# Patient Record
Sex: Female | Born: 1945 | Race: Black or African American | Hispanic: No | Smoking: Never smoker
Health system: Southern US, Community
[De-identification: ages and names within clinical notes are randomized; demographics above are authoritative.]

## PROBLEM LIST (undated history)

## (undated) DIAGNOSIS — I10 Essential (primary) hypertension: Secondary | ICD-10-CM

## (undated) HISTORY — PX: BREAST BIOPSY: SHX20

---

## 1990-06-30 HISTORY — PX: CHOLECYSTECTOMY: SHX55

## 2004-05-14 ENCOUNTER — Ambulatory Visit: Payer: Self-pay | Admitting: Obstetrics and Gynecology

## 2005-05-15 ENCOUNTER — Ambulatory Visit: Payer: Self-pay | Admitting: Obstetrics and Gynecology

## 2006-04-15 ENCOUNTER — Ambulatory Visit: Payer: Self-pay | Admitting: Gastroenterology

## 2006-06-04 ENCOUNTER — Ambulatory Visit: Payer: Self-pay | Admitting: Obstetrics and Gynecology

## 2006-06-16 ENCOUNTER — Ambulatory Visit: Payer: Self-pay | Admitting: Gastroenterology

## 2007-06-22 ENCOUNTER — Ambulatory Visit: Payer: Self-pay | Admitting: Obstetrics and Gynecology

## 2007-06-29 ENCOUNTER — Ambulatory Visit: Payer: Self-pay | Admitting: Obstetrics and Gynecology

## 2007-10-12 ENCOUNTER — Ambulatory Visit: Payer: Self-pay | Admitting: Unknown Physician Specialty

## 2008-06-26 ENCOUNTER — Ambulatory Visit: Payer: Self-pay | Admitting: Obstetrics and Gynecology

## 2009-06-28 ENCOUNTER — Ambulatory Visit: Payer: Self-pay | Admitting: Obstetrics and Gynecology

## 2009-06-28 ENCOUNTER — Ambulatory Visit: Payer: Self-pay | Admitting: Gastroenterology

## 2009-08-20 ENCOUNTER — Ambulatory Visit: Payer: Self-pay | Admitting: Gastroenterology

## 2009-08-27 ENCOUNTER — Emergency Department: Payer: Self-pay | Admitting: Emergency Medicine

## 2009-08-31 ENCOUNTER — Ambulatory Visit: Payer: Self-pay | Admitting: Gastroenterology

## 2009-09-03 ENCOUNTER — Ambulatory Visit: Payer: Self-pay | Admitting: Gastroenterology

## 2010-03-11 ENCOUNTER — Ambulatory Visit: Payer: Self-pay | Admitting: Gastroenterology

## 2011-08-20 ENCOUNTER — Ambulatory Visit: Payer: Self-pay | Admitting: Obstetrics and Gynecology

## 2012-09-17 DIAGNOSIS — I10 Essential (primary) hypertension: Secondary | ICD-10-CM | POA: Insufficient documentation

## 2013-03-23 ENCOUNTER — Encounter: Payer: Self-pay | Admitting: General Surgery

## 2013-04-01 ENCOUNTER — Ambulatory Visit: Payer: Self-pay | Admitting: Family Medicine

## 2013-04-07 ENCOUNTER — Ambulatory Visit: Payer: Self-pay | Admitting: General Surgery

## 2013-04-11 ENCOUNTER — Encounter: Payer: Self-pay | Admitting: General Surgery

## 2013-04-26 ENCOUNTER — Ambulatory Visit: Payer: Self-pay | Admitting: General Surgery

## 2013-04-28 ENCOUNTER — Ambulatory Visit (INDEPENDENT_AMBULATORY_CARE_PROVIDER_SITE_OTHER): Payer: Medicare Other | Admitting: General Surgery

## 2013-04-28 ENCOUNTER — Encounter: Payer: Self-pay | Admitting: General Surgery

## 2013-04-28 VITALS — BP 146/80 | HR 88 | Resp 16 | Ht 64.0 in | Wt 187.0 lb

## 2013-04-28 DIAGNOSIS — N6452 Nipple discharge: Secondary | ICD-10-CM

## 2013-04-28 DIAGNOSIS — N6459 Other signs and symptoms in breast: Secondary | ICD-10-CM

## 2013-04-28 NOTE — Progress Notes (Addendum)
Patient ID: Abigail Ayers, female   DOB: 03-08-1946, 67 y.o.   MRN: [id number]  Chief Complaint  Patient presents with  . Other    new patient left breast bloody discharge    HPI Abigail Ayers is a 67 y.o. female.  who presents for a breast evaluation. The most recent mammogram was done on 04-01-13.  She had an ultrasound done as well.    She was leaning over the back of a seat and it put pressure with her underwire bra around July. She states after that she had bloody nipple drainage (x3) from the left breast and she felt the area was bruised on the the lower breast area.  No discoloration noted. No trauma to the nipple/ areolar area.  No drainage since since the end of September. Denies breast pain. She states Dr Greggory Stallion felt a lump in the right breast.  Patient does perform regular self breast checks and gets regular mammograms done.    Review of the PCP notes dated 03/20/2013 reported a history of clear drainage from the right breast as well as bloody drainage from the left.  By report, the clear fluid from the right breast had been ongoing for several years and unchanged. The bloody discharge from the left was new. No previous breast issues.    The patient's daughter had breast cancer at age 30 and by report was BRCA negative. HPI  History reviewed. No pertinent past medical history.  Past Surgical History  Procedure Laterality Date  . Cholecystectomy  1992    Family History  Problem Relation Age of Onset  . Breast cancer Daughter 20    [place], genetic test negative    Social History History  Substance Use Topics  . Smoking status: Never Smoker   . Smokeless tobacco: Not on file  . Alcohol Use: No    No Known Allergies  Current Outpatient Prescriptions  Medication Sig Dispense Refill  . Ascorbic Acid (VITAMIN C PO) Take 1 tablet by mouth.       . Cholecalciferol (VITAMIN D) 1000 UNITS capsule Take 1,000 Units by mouth.      . COD LIVER OIL PO Take by mouth.       . Flax OIL Take by mouth.      . magnesium 30 MG tablet Take 30 mg by mouth.      . Multiple Vitamin (MULTIVITAMIN) tablet Take 1 tablet by mouth.       . Potassium 99 MG TABS Take by mouth.      . vitamin B-12 (CYANOCOBALAMIN) 100 MCG tablet Take 50 mcg by mouth daily.      Marland Kitchen VITAMIN E PO Take 1 tablet by mouth.        No current facility-administered medications for this visit.    Review of Systems Review of Systems  Constitutional: Negative.   Respiratory: Negative.   Cardiovascular: Negative.     Blood pressure 146/80, pulse 88, resp. rate 16, height 5\' 4"  (1.626 m), weight 187 lb (84.823 kg).  Physical Exam Physical Exam  Constitutional: She is oriented to person, place, and time. She appears well-developed and well-nourished.  Neck: Neck supple.  Cardiovascular: Normal rate, regular rhythm and normal heart sounds.   Pulmonary/Chest: Effort normal and breath sounds normal. Right breast exhibits no inverted nipple, no mass, no nipple discharge, no skin change and no tenderness. Left breast exhibits nipple discharge. Left breast exhibits no inverted nipple, no mass, no skin change and no tenderness.  Left  breast > right breast Left nipple sits a little lower than the right nipple. The patient reports this is her baseline. Single duct discharge left nipple. Yellow with tinge of dark brown.  Right breast shows no discharge with manipulation.  Lymphadenopathy:    She has no cervical adenopathy.    She has no axillary adenopathy.  Neurological: She is alert and oriented to person, place, and time.  Skin: Skin is warm and dry.    Data Reviewed Imaging studies dated 04/01/2013 were reviewed. Mammogram showed no interval change. BI-RAD-1. Ultrasound examination was negative in both retroareolar areas.  Assessment    Spontaneous breast drainage, possibly related to intraductal papilloma.    Plan    Drainage was elicited on today's exam. Primarily thin fluid consistent dark  blood. With a negative ultrasound and mammogram, this is likely a small papilloma, but imaging cannot exclude a more sinister process. As drainage is still present now 3 months after initial presentation (with manipulation) I think duct excision as appropriate  The procedure was discussed in detail.  Cc: Angus Palms, MD     Patient scheduled for left breast biopsy at [facility] on 05/05/13, she is aware of instructions and date. She will call us with any questions or changes with her medications or health.  Earline Mayotte 04/29/2013, 10:25 AM

## 2013-04-28 NOTE — Patient Instructions (Addendum)
The patient is aware to call back for any questions or concerns. Left breast biopsy at [facility] as outpatient   Patient scheduled for left breast biopsy at [facility] on 05/05/13, she is aware of instructions and date. She will call us with any questions or changes with her medications or health.

## 2013-04-29 ENCOUNTER — Encounter: Payer: Self-pay | Admitting: General Surgery

## 2013-04-29 ENCOUNTER — Telehealth: Payer: Self-pay | Admitting: General Surgery

## 2013-04-29 ENCOUNTER — Other Ambulatory Visit: Payer: Self-pay | Admitting: General Surgery

## 2013-04-29 DIAGNOSIS — N6452 Nipple discharge: Secondary | ICD-10-CM

## 2013-05-02 ENCOUNTER — Ambulatory Visit: Payer: Self-pay | Admitting: General Surgery

## 2013-05-05 ENCOUNTER — Ambulatory Visit: Payer: Self-pay | Admitting: General Surgery

## 2013-05-05 DIAGNOSIS — N6459 Other signs and symptoms in breast: Secondary | ICD-10-CM

## 2013-05-09 LAB — PATHOLOGY REPORT

## 2013-05-10 ENCOUNTER — Telehealth: Payer: Self-pay | Admitting: General Surgery

## 2013-05-10 ENCOUNTER — Encounter: Payer: Self-pay | Admitting: General Surgery

## 2013-05-10 NOTE — Telephone Encounter (Signed)
Patient notified biopsy results were benign. Reports doing well. Tylenol for discomfort the evening of surgery, nothing since then. F/U scheduled for Nov 13th.

## 2013-05-11 ENCOUNTER — Encounter: Payer: Self-pay | Admitting: General Surgery

## 2013-05-12 ENCOUNTER — Ambulatory Visit (INDEPENDENT_AMBULATORY_CARE_PROVIDER_SITE_OTHER): Payer: Medicare Other | Admitting: General Surgery

## 2013-05-12 ENCOUNTER — Encounter: Payer: Self-pay | Admitting: General Surgery

## 2013-05-12 VITALS — BP 152/84 | HR 66 | Resp 13 | Ht 64.0 in | Wt 187.0 lb

## 2013-05-12 DIAGNOSIS — N6459 Other signs and symptoms in breast: Secondary | ICD-10-CM

## 2013-05-12 DIAGNOSIS — N6452 Nipple discharge: Secondary | ICD-10-CM

## 2013-05-12 NOTE — Progress Notes (Signed)
Patient ID: Abigail Ayers, female   DO: Jul 12, 1945, 67 y.o.   MRM: [id number]  Chief Complaint  Patient presents with  . Routine Post Op    left breast biopsy    HPI Abigail Ayers is a 67 y.o. female here today for her posy op left breast biopsy done on 05/05/13. Patient states she is doing well with no complaints.  HPI  No past medical history on file.  Past Surgical History  Procedure Laterality Date  . Cholecystectomy  1992    Family History  Problem Relation Age of Onset  . Breast cancer Daughter 47    [place], genetic test negative    Social History History  Substance Use Topics  . Smoking status: Never Smoker   . Smokeless tobacco: Not on file  . Alcohol Use: No    No Known Allergies  Current Outpatient Prescriptions  Medication Sig Dispense Refill  . Ascorbic Acid (VITAMIN C PO) Take 1 tablet by mouth.       . Cholecalciferol (VITAMIN D) 1000 UNITS capsule Take 1,000 Units by mouth.      . COD LIVER OIL PO Take by mouth.      . Flax OIL Take by mouth.      . magnesium 30 MG tablet Take 30 mg by mouth.      . Multiple Vitamin (MULTIVITAMIN) tablet Take 1 tablet by mouth.       . Potassium 99 MG TABS Take by mouth.      . vitamin B-12 (CYANOCOBALAMIN) 100 MCG tablet Take 50 mcg by mouth daily.      Marland Kitchen VITAMIN E PO Take 1 tablet by mouth.        No current facility-administered medications for this visit.    Review of Systems Review of Systems  Constitutional: Negative.   Respiratory: Negative.   Cardiovascular: Negative.     Blood pressure 152/84, pulse 66, resp. rate 13, height 5\' 4"  (1.626 m), weight 187 lb (84.823 kg).  Physical Exam Physical Exam  Constitutional: She is oriented to person, place, and time. She appears well-developed and well-nourished.  Pulmonary/Chest:    Left breast incision is healing well.  Neurological: She is alert and oriented to person, place, and time.  Skin: Skin is warm and dry.    Data Reviewed Pathology of  the left retroareolar ductal structure showed focal periductal chronic inflammation. Skin biopsy showed no pathologic change. No atypia or malignancy. Papilloma not identified.  Assessment    Doing well status post left central duct excision.     Plan    She was encouraged to apply Neosporin to the area of the nipple until healing is complete.        Earline Mayotte 05/13/2013, 6:10 AM

## 2013-05-12 NOTE — Patient Instructions (Signed)
Patient to return in three months.  

## 2013-08-16 ENCOUNTER — Ambulatory Visit: Payer: Self-pay | Admitting: General Surgery

## 2013-08-31 ENCOUNTER — Ambulatory Visit (INDEPENDENT_AMBULATORY_CARE_PROVIDER_SITE_OTHER): Payer: Medicare HMO | Admitting: General Surgery

## 2013-08-31 ENCOUNTER — Encounter: Payer: Self-pay | Admitting: General Surgery

## 2013-08-31 VITALS — BP 146/86 | HR 76 | Resp 12 | Ht 64.0 in | Wt 188.0 lb

## 2013-08-31 DIAGNOSIS — N6452 Nipple discharge: Secondary | ICD-10-CM

## 2013-08-31 DIAGNOSIS — N6459 Other signs and symptoms in breast: Secondary | ICD-10-CM

## 2013-08-31 NOTE — Patient Instructions (Addendum)
Continue self breast exams. Call office for any new breast issues or concerns. Further mammograms and evaluation with primary care doctor

## 2013-08-31 NOTE — Progress Notes (Signed)
Patient ID: Abigail Ayers, female   DOB: 02-11-1946, 68 y.o.   MRN: [id number]  Chief Complaint  Patient presents with  . Follow-up    left nipple drainage    HPI Abigail Ayers is a 68 y.o. female.  Here today for follow up left nipple drainage, she has not noticed any drainage and has not pressed on it to see if it will drain.  She has had a left breast biopsy done on 05/05/13. Patient states she is still doing well with no new breast complaints.   HPI  No past medical history on file.  Past Surgical History  Procedure Laterality Date  . Cholecystectomy  1992    Family History  Problem Relation Age of Onset  . Breast cancer Daughter 77    [place], genetic test negative    Social History History  Substance Use Topics  . Smoking status: Never Smoker   . Smokeless tobacco: Not on file  . Alcohol Use: No    No Known Allergies  Current Outpatient Prescriptions  Medication Sig Dispense Refill  . Ascorbic Acid (VITAMIN C PO) Take 1 tablet by mouth.       . Cholecalciferol (VITAMIN D) 1000 UNITS capsule Take 1,000 Units by mouth.      . COD LIVER OIL PO Take by mouth.      . Flax OIL Take by mouth.      . magnesium 30 MG tablet Take 30 mg by mouth.      . Multiple Vitamin (MULTIVITAMIN) tablet Take 1 tablet by mouth.       . Potassium 99 MG TABS Take by mouth.      . vitamin B-12 (CYANOCOBALAMIN) 100 MCG tablet Take 50 mcg by mouth daily.      Marland Kitchen VITAMIN E PO Take 1 tablet by mouth.        No current facility-administered medications for this visit.    Review of Systems Review of Systems  Constitutional: Negative.   Respiratory: Negative.   Cardiovascular: Negative.     Blood pressure 146/86, pulse 76, resp. rate 12, height 5\' 4"  (1.626 m), weight 188 lb (85.276 kg).  Physical Exam Physical Exam  Constitutional: She is oriented to person, place, and time. She appears well-developed and well-nourished.  Neck: Neck supple.  Cardiovascular: Normal rate, regular  rhythm and normal heart sounds.   Pulmonary/Chest: Effort normal and breath sounds normal. Right breast exhibits no inverted nipple, no mass, no nipple discharge, no skin change and no tenderness. Left breast exhibits no inverted nipple, no mass, no nipple discharge, no skin change and no tenderness.  Well healed scar 8-11 o'clock with slight thickening left breast  Lymphadenopathy:    She has no cervical adenopathy.    She has no axillary adenopathy.  Neurological: She is alert and oriented to person, place, and time.  Skin: Skin is warm, dry and intact.    Data Reviewed Pathology of May 05, 2013 biopsies from the left retroareolar ductal tissue showed focal periductal chronic inflammation without atypia or malignancy. Excision of the duct with the nipple showed skin with no pathologic change.  Assessment    Doing well status post left breast duct excision. No evidence of recurrent right breast ductal drainage.     Plan    Annual screening mammograms and evaluation with primary care physician due in Fall 2015.She was encouraged to call if she appreciated any changes lateral breast self-examination.  The patient was notified that today's blood pressure  needs to be watched carefully, as her diastolic numbers approaching 90.       Robert Bellow 09/01/2013, 5:16 PM

## 2014-05-01 ENCOUNTER — Encounter: Payer: Self-pay | Admitting: General Surgery

## 2014-06-16 ENCOUNTER — Ambulatory Visit: Payer: Self-pay | Admitting: Gastroenterology

## 2014-10-20 NOTE — Op Note (Signed)
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  OPERATIVE PROCEDURE: Excision of left breast retroareolar ductal network.   OPERATING SURGEON: Robert Bellow, MD  ANESTHESIA: General by LMA, Marcaine 0.5% plain 30 mL local infiltration.   ESTIMATED BLOOD LOSS: Minimal.   CLINICAL NOTE: This 69 year old woman had spontaneous development of bloody nipple drainage. Mammogram and ultrasound were negative. She was felt to be a candidate for duct excision.   OPERATIVE NOTE: With the patient under adequate general anesthesia, the area was prepped with ChloraPrep and draped. A circumareolar incision from the 7 to 12 o'clock position was made after instillation of local anesthesia. The skin was incised sharply and the remaining dissection completed with electrocautery. The medial aspect of the areola was elevated to expose the undersurface of the nipple. This was then excised sharply, and a core of the nipple along the course of the dilated duct as identified by a lacrimal duct probe was removed with a 2 mm punch biopsy. The underlying ductal tissue in the central portion of the breast was excised with cautery. No large dilated ducts were identified. The specimen was orientated and sent fresh per protocol for routine histology. The wound was then approximated with interrupted 2-0 Vicryl figure-of-eight sutures to obliterate the dead space. The adipose tissue in the area below the areola was approximated with interrupted 2-0 Vicryl sutures. The skin was closed with a running 4-0 Vicryl suture in multiple segments. Benzoin and Steri-Strips followed by Telfa, fluff gauze, Kerlix and an Ace wrap were applied.   Care was taken not to apply undue pressure on the nipple.   The patient tolerated  the procedure well and was taken to the recovery room in stable condition.    ____________________________ Robert Bellow, MD jwb:lb D: 05/06/2013 08:44:35 ET T: 05/06/2013 09:02:39 ET JOB#: [id number]  cc: Robert Bellow, MD, <Dictator> Salome Holmes, MD Anayi Bricco Amedeo Kinsman MD ELECTRONICALLY SIGNED 05/06/2013 11:58

## 2015-09-05 ENCOUNTER — Other Ambulatory Visit: Payer: Self-pay | Admitting: Family Medicine

## 2015-09-05 DIAGNOSIS — Z1239 Encounter for other screening for malignant neoplasm of breast: Secondary | ICD-10-CM

## 2015-09-21 ENCOUNTER — Ambulatory Visit
Admission: RE | Admit: 2015-09-21 | Discharge: 2015-09-21 | Disposition: A | Discharge status: Home / Self Care | Payer: Medicare HMO | Source: Ambulatory Visit | Attending: Family Medicine | Admitting: Family Medicine

## 2015-09-21 ENCOUNTER — Other Ambulatory Visit: Payer: Self-pay | Admitting: Family Medicine

## 2015-09-21 DIAGNOSIS — Z1239 Encounter for other screening for malignant neoplasm of breast: Secondary | ICD-10-CM

## 2015-09-21 DIAGNOSIS — Z1231 Encounter for screening mammogram for malignant neoplasm of breast: Secondary | ICD-10-CM | POA: Insufficient documentation

## 2016-09-03 ENCOUNTER — Other Ambulatory Visit: Payer: Self-pay | Admitting: Family Medicine

## 2016-09-03 DIAGNOSIS — Z1231 Encounter for screening mammogram for malignant neoplasm of breast: Secondary | ICD-10-CM

## 2016-09-10 ENCOUNTER — Ambulatory Visit: Admission: RE | Admit: 2016-09-10 | Payer: Medicare HMO | Source: Ambulatory Visit

## 2016-09-22 ENCOUNTER — Ambulatory Visit
Admission: RE | Admit: 2016-09-22 | Discharge: 2016-09-22 | Disposition: A | Discharge status: Home / Self Care | Payer: Medicare HMO | Source: Ambulatory Visit | Attending: Family Medicine | Admitting: Family Medicine

## 2016-09-22 DIAGNOSIS — Z1231 Encounter for screening mammogram for malignant neoplasm of breast: Secondary | ICD-10-CM | POA: Insufficient documentation

## 2017-06-17 DIAGNOSIS — M179 Osteoarthritis of knee, unspecified: Secondary | ICD-10-CM | POA: Insufficient documentation

## 2017-06-17 DIAGNOSIS — M1710 Unilateral primary osteoarthritis, unspecified knee: Secondary | ICD-10-CM | POA: Insufficient documentation

## 2017-09-07 ENCOUNTER — Other Ambulatory Visit: Payer: Self-pay | Admitting: Family Medicine

## 2017-09-07 DIAGNOSIS — Z1239 Encounter for other screening for malignant neoplasm of breast: Secondary | ICD-10-CM

## 2017-10-07 ENCOUNTER — Ambulatory Visit
Admission: RE | Admit: 2017-10-07 | Discharge: 2017-10-07 | Disposition: A | Discharge status: Home / Self Care | Payer: Medicare HMO | Source: Ambulatory Visit | Attending: Family Medicine | Admitting: Family Medicine

## 2017-10-07 DIAGNOSIS — Z1231 Encounter for screening mammogram for malignant neoplasm of breast: Secondary | ICD-10-CM | POA: Insufficient documentation

## 2017-10-07 DIAGNOSIS — Z1239 Encounter for other screening for malignant neoplasm of breast: Secondary | ICD-10-CM

## 2017-11-12 ENCOUNTER — Encounter (INDEPENDENT_AMBULATORY_CARE_PROVIDER_SITE_OTHER): Payer: Medicare HMO | Admitting: Vascular Surgery

## 2017-11-17 ENCOUNTER — Ambulatory Visit (INDEPENDENT_AMBULATORY_CARE_PROVIDER_SITE_OTHER): Payer: Medicare HMO | Admitting: Vascular Surgery

## 2017-12-01 ENCOUNTER — Encounter (INDEPENDENT_AMBULATORY_CARE_PROVIDER_SITE_OTHER): Payer: Self-pay | Admitting: Vascular Surgery

## 2017-12-01 ENCOUNTER — Encounter (INDEPENDENT_AMBULATORY_CARE_PROVIDER_SITE_OTHER): Payer: Self-pay

## 2017-12-01 ENCOUNTER — Ambulatory Visit (INDEPENDENT_AMBULATORY_CARE_PROVIDER_SITE_OTHER): Payer: Medicare HMO | Admitting: Vascular Surgery

## 2017-12-01 ENCOUNTER — Encounter (INDEPENDENT_AMBULATORY_CARE_PROVIDER_SITE_OTHER): Payer: Medicare HMO | Admitting: Vascular Surgery

## 2017-12-01 VITALS — BP 172/92 | HR 92 | Resp 14 | Ht 65.0 in | Wt 210.0 lb

## 2017-12-01 DIAGNOSIS — I739 Peripheral vascular disease, unspecified: Secondary | ICD-10-CM | POA: Diagnosis not present

## 2017-12-01 DIAGNOSIS — I83813 Varicose veins of bilateral lower extremities with pain: Secondary | ICD-10-CM | POA: Insufficient documentation

## 2017-12-01 NOTE — Progress Notes (Signed)
Subjective:    Patient ID: Abigail Ayers, female    DOB: 01-12-1946, 72 y.o.   MRN: [id number] Chief Complaint  Patient presents with  . New Patient (Initial Visit)    Varicose Veins, painful legs   Presents as a new patient self-referred for evaluation of painful varicose veins noted to the bilateral lower extremity.  The patient endorses a long-standing history of varicosities located to the bilateral legs.  She notes that they have become more painful and have increased in size and number recently.  The patient's discomfort has progressed to the point that she is unable to function on a daily basis and her symptoms have become lifestyle limiting prompting her to seek medical attention.  The patient notes she experiences a throbbing and cramping to her calf muscles usually towards the evening hour.  The patient denies experiencing any rest pain or ulcer formation to the lower extremity.  The patient denies any swelling to the bilateral lower extremity.  At this time, the patient does not engage in conservative therapy including wearing medical grade 1 compression socks, elevating her legs and remaining active on a daily basis.  The patient denies any recent surgery or trauma to the bilateral lower extremity.  Patient denies any DVT history to the bilateral lower extremity.  The patient denies any fever, nausea vomiting.  Review of Systems  Constitutional: Negative.   HENT: Negative.   Eyes: Negative.   Respiratory: Negative.   Cardiovascular:       Painful varicose veins Calf claudication  Gastrointestinal: Negative.   Endocrine: Negative.   Genitourinary: Negative.   Musculoskeletal: Negative.   Skin: Negative.   Allergic/Immunologic: Negative.   Neurological: Negative.   Hematological: Negative.   Psychiatric/Behavioral: Negative.       Objective:   Physical Exam  Constitutional: She is oriented to person, place, and time. She appears well-developed and well-nourished. No  distress.  HENT:  Head: Normocephalic and atraumatic.  Right Ear: External ear normal.  Left Ear: External ear normal.  Eyes: Pupils are equal, round, and reactive to light. Conjunctivae and EOM are normal.  Neck: Normal range of motion.  Cardiovascular: Normal rate, regular rhythm, normal heart sounds and intact distal pulses.  Pulses:      Radial pulses are 2+ on the right side, and 2+ on the left side.       Dorsalis pedis pulses are 1+ on the right side, and 1+ on the left side.       Posterior tibial pulses are 1+ on the right side, and 1+ on the left side.  Pulmonary/Chest: Effort normal and breath sounds normal.  Musculoskeletal: Normal range of motion. She exhibits no edema.  Neurological: She is alert and oriented to person, place, and time.  Skin: Skin is warm and dry. She is not diaphoretic.  Less than 1 cm scattered varicosities noted to the bilateral lower extremity.  There is no stasis dermatitis, fibrosis, cellulitis or active ulcerations noted to the bilateral legs.  Psychiatric: She has a normal mood and affect. Her behavior is normal. Judgment and thought content normal.  Vitals reviewed.  BP (!) 172/92 (BP Location: Right Arm, Patient Position: Sitting)   Pulse 92   Resp 14   Ht 5\' 5"  (1.651 m)   Wt 210 lb (95.3 kg)   BMI 34.95 kg/m   No past medical history on file.  Social History   Socioeconomic History  . Marital status: Widowed    Spouse name: Not on  file  . Number of children: Not on file  . Years of education: Not on file  . Highest education level: Not on file  Occupational History  . Not on file  Social Needs  . Financial resource strain: Not on file  . Food insecurity:    Worry: Not on file    Inability: Not on file  . Transportation needs:    Medical: Not on file    Non-medical: Not on file  Tobacco Use  . Smoking status: Never Smoker  . Smokeless tobacco: Never Used  Substance and Sexual Activity  . Alcohol use: No  . Drug use: No    . Sexual activity: Not on file  Lifestyle  . Physical activity:    Days per week: Not on file    Minutes per session: Not on file  . Stress: Not on file  Relationships  . Social connections:    Talks on phone: Not on file    Gets together: Not on file    Attends religious service: Not on file    Active member of club or organization: Not on file    Attends meetings of clubs or organizations: Not on file    Relationship status: Not on file  . Intimate partner violence:    Fear of current or ex partner: Not on file    Emotionally abused: Not on file    Physically abused: Not on file    Forced sexual activity: Not on file  Other Topics Concern  . Not on file  Social History Narrative  . Not on file   Past Surgical History:  Procedure Laterality Date  . BREAST BIOPSY Left    neg  . CHOLECYSTECTOMY  1992   Family History  Problem Relation Age of Onset  . Breast cancer Daughter 29       [place], genetic test negative   Allergies  Allergen Reactions  . Lisinopril     Other reaction(s): Cough      Assessment & Plan:  Presents as a new patient self-referred for evaluation of painful varicose veins noted to the bilateral lower extremity.  The patient endorses a long-standing history of varicosities located to the bilateral legs.  She notes that they have become more painful and have increased in size and number recently.  The patient's discomfort has progressed to the point that she is unable to function on a daily basis and her symptoms have become lifestyle limiting prompting her to seek medical attention.  The patient notes she experiences a throbbing and cramping to her calf muscles usually towards the evening hour.  The patient denies experiencing any rest pain or ulcer formation to the lower extremity.  The patient denies any swelling to the bilateral lower extremity.  At this time, the patient does not engage in conservative therapy including wearing medical grade 1 compression  socks, elevating her legs and remaining active on a daily basis.  The patient denies any recent surgery or trauma to the bilateral lower extremity.  Patient denies any DVT history to the bilateral lower extremity.  The patient denies any fever, nausea vomiting.  1. Varicose veins of both lower extremities with pain - New The patient was encouraged to wear graduated compression stockings (20-30 mmHg) on a daily basis. The patient was instructed to begin wearing the stockings first thing in the morning and removing them in the evening. The patient was instructed specifically not to sleep in the stockings. Prescription given.  In addition, behavioral  modification including elevation during the day will be initiated. Anti-inflammatories for pain. I will bring the patient back and have her undergo a bilateral lower extremity venous duplex to rule out any contributing reflux/venous disease The patient will follow up in one month to asses conservative management.  Information on compression stockings was given to the patient. The patient was instructed to call the office in the interim if any worsening edema or ulcerations to the legs, feet or toes occurs. The patient expresses their understanding.  - VAS [place] LOWER EXTREMITY VENOUS REFLUX; Future  2. 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  The patient was encouraged to call the office in the interim if he experiences any claudication like symptoms, rest pain or ulcers to his feet / toes.  - VAS [place] ABI WITH/WO TBI; Future  Current Outpatient  Medications on File Prior to Visit  Medication Sig Dispense Refill  . Ascorbic Acid (VITAMIN C PO) Take 1 tablet by mouth.     . Cholecalciferol (VITAMIN D) 1000 UNITS capsule Take 1,000 Units by mouth.    . COD LIVER OIL PO Take by mouth.    . diclofenac sodium (VOLTAREN) 1 % GEL diclofenac 1 % topical gel  APPLY 2 GRAMS TO AFFECTED AREA FOUR TIMES A DAY    . Flax OIL Take by mouth.    . Magnesium Gluconate 550 MG TABS Take by mouth.    . Multiple Vitamin (MULTIVITAMIN) tablet Take 1 tablet by mouth.     . Potassium 99 MG TABS Take by mouth.    . vitamin B-12 (CYANOCOBALAMIN) 100 MCG tablet Take 50 mcg by mouth daily.    Marland Kitchen VITAMIN E PO Take 1 tablet by mouth.     . magnesium 30 MG tablet Take 30 mg by mouth.    . meloxicam (MOBIC) 15 MG tablet Mobic 15 mg tablet  Take 1 tablet every day by oral route.    . predniSONE (DELTASONE) 20 MG tablet prednisone 20 mg tablet     No current facility-administered medications on file prior to visit.    There are no Patient Instructions on file for this visit. No follow-ups on file.  Chelsi Warr A Jermani Pund, PA-C

## 2018-03-08 ENCOUNTER — Encounter (INDEPENDENT_AMBULATORY_CARE_PROVIDER_SITE_OTHER): Payer: Medicare HMO

## 2018-03-08 ENCOUNTER — Ambulatory Visit (INDEPENDENT_AMBULATORY_CARE_PROVIDER_SITE_OTHER): Payer: Medicare HMO | Admitting: Vascular Surgery

## 2018-03-18 ENCOUNTER — Ambulatory Visit (INDEPENDENT_AMBULATORY_CARE_PROVIDER_SITE_OTHER): Payer: Medicare HMO | Admitting: Nurse Practitioner

## 2018-03-18 ENCOUNTER — Ambulatory Visit (INDEPENDENT_AMBULATORY_CARE_PROVIDER_SITE_OTHER): Payer: Medicare HMO

## 2018-03-18 ENCOUNTER — Encounter (INDEPENDENT_AMBULATORY_CARE_PROVIDER_SITE_OTHER): Payer: Self-pay | Admitting: Nurse Practitioner

## 2018-03-18 VITALS — BP 197/107 | HR 73 | Resp 17 | Ht 67.0 in | Wt 203.0 lb

## 2018-03-18 DIAGNOSIS — I83812 Varicose veins of left lower extremities with pain: Secondary | ICD-10-CM

## 2018-03-18 DIAGNOSIS — I10 Essential (primary) hypertension: Secondary | ICD-10-CM

## 2018-03-18 DIAGNOSIS — M170 Bilateral primary osteoarthritis of knee: Secondary | ICD-10-CM | POA: Diagnosis not present

## 2018-03-18 DIAGNOSIS — I83811 Varicose veins of right lower extremities with pain: Secondary | ICD-10-CM | POA: Diagnosis not present

## 2018-03-18 DIAGNOSIS — I739 Peripheral vascular disease, unspecified: Secondary | ICD-10-CM

## 2018-03-18 DIAGNOSIS — I872 Venous insufficiency (chronic) (peripheral): Secondary | ICD-10-CM | POA: Diagnosis not present

## 2018-03-18 DIAGNOSIS — I83813 Varicose veins of bilateral lower extremities with pain: Secondary | ICD-10-CM

## 2018-03-18 NOTE — Progress Notes (Signed)
Subjective:    Patient ID: Abigail Ayers, female    DOB: October 06, 1945, 72 y.o.   MRN: [id number] Chief Complaint  Patient presents with  . Follow-up    ABI and Reflux study    HPI  Abigail Ayers is a 72 y.o. female presents for concerns of bilateral lower extremity pain.  She describes this pain as something that generally begins during the evening hour.  She states that it is more of aching, burning sensation.  She states that the pain travels from her calves to her thighs.  She states that there is a heat flushing sensation when this occurs.  Patient states that this has started to become lifestyle limiting.  She states that in order to try to relieve the pain utilizes rubbing alcohol which sometimes relieves the sensations.  The patient states that she has been utilizing medical grade 1 compression stocking therapy since her last visit on December 01, 2017.  She states that utilizing medical grade 1 compression stockings are helpful for edema however it does not relieve the pain sensations.  She denies any intermittent claudication-like symptoms or rest pain.  Patient denies any chest pain or shortness of breath.  She denies any fever, nausea, vomiting, chills, diarrhea.  She denies any history of deep venous thrombosis.  Patient underwent a bilateral lower extremity duplex as well as ABIs today.  The bilateral lower extremity venous reflux study revealed no evidence of DVT in either lower extremity.  There is no evidence of superficial venous thrombosis in either lower extremity.  There is evidence of deep venous reflux in the bilateral common femoral veins.  The patient's ABI reveals an ABI of 1.01 on the right.  A TBI of 0.69.  The left lower extremity has an ABI of 0.93, with a TBI of 0.77.  There are triphasic waveforms throughout.  Digital waveforms are brisk.  Review of Systems: Negative Unless Checked Constitutional: [] Weight loss  [] Fever  [] Chills Cardiac: [] Chest pain   [] Chest  pressure   [] Palpitations   [] Shortness of breath when laying flat   [] Shortness of breath with exertion. Vascular:  [] Pain in legs with walking   [] Pain in legs with standing  [] History of DVT   [] Phlebitis   [x] Swelling in legs   [x] Varicose veins   [] Non-healing ulcers Pulmonary:   [] Uses home oxygen   [] Productive cough   [] Hemoptysis   [] Wheeze  [] COPD   [] Asthma Neurologic:  [] Dizziness   [] Seizures   [] History of stroke   [] History of TIA  [] Aphasia   [] Vissual changes   [] Weakness or numbness in arm   [] Weakness or numbness in leg Musculoskeletal:   [] Joint swelling   [] Joint pain   [] Low back pain Hematologic:  [] Easy bruising  [] Easy bleeding   [] Hypercoagulable state   [] Anemic Gastrointestinal:  [] Diarrhea   [] Vomiting  [] Gastroesophageal reflux/heartburn   [] Difficulty swallowing. Genitourinary:  [] Chronic kidney disease   [] Difficult urination  [] Frequent urination   [] Blood in urine Skin:  [] Rashes   [] Ulcers  Psychological:  [] History of anxiety   []  History of major depression.     Objective:   Physical Exam  BP (!) 197/107 (BP Location: Right Arm, Patient Position: Sitting)   Pulse 73   Resp 17   Ht 5\' 7"  (1.702 m)   Wt 203 lb (92.1 kg)   BMI 31.79 kg/m   History reviewed. No pertinent past medical history.   Gen: WD/WN, NAD Head: [place]/AT, No temporalis wasting.  Ear/Nose/Throat:  Hearing grossly intact, nares w/o erythema or drainage Eyes: PER, EOMI, sclera nonicteric.  Neck: Supple, no masses.  No JVD.  Pulmonary:  Good air movement, no use of accessory muscles.  Cardiac: RRR Vascular:  Scattered spider varicosities on bilateral lower extremities.  Minimal swelling bilaterally.  No ulcerations.  No stasis dermatitis present. Vessel Right Left  Radial    Brachial    Femoral    Popliteal    PT  1+  1+  DP  1+  1+  Carotid    Gastrointestinal: soft, non-distended. No guarding/no peritoneal signs.  Musculoskeletal: M/S 5/5 throughout.  No deformity or atrophy.    Neurologic: Pain and light touch intact in extremities.  Symmetrical.  Speech is fluent. Motor exam as listed above. Psychiatric: Judgment intact, Mood & affect appropriate for pt's clinical situation. Dermatologic: No Venous rashes. No Ulcers Noted.  No changes consistent with cellulitis. Lymph : No Cervical lymphadenopathy, no lichenification or skin changes of chronic lymphedema.   Social History   Socioeconomic History  . Marital status: Widowed    Spouse name: Not on file  . Number of children: Not on file  . Years of education: Not on file  . Highest education level: Not on file  Occupational History  . Not on file  Social Needs  . Financial resource strain: Not on file  . Food insecurity:    Worry: Not on file    Inability: Not on file  . Transportation needs:    Medical: Not on file    Non-medical: Not on file  Tobacco Use  . Smoking status: Never Smoker  . Smokeless tobacco: Never Used  Substance and Sexual Activity  . Alcohol use: No  . Drug use: No  . Sexual activity: Not on file  Lifestyle  . Physical activity:    Days per week: Not on file    Minutes per session: Not on file  . Stress: Not on file  Relationships  . Social connections:    Talks on phone: Not on file    Gets together: Not on file    Attends religious service: Not on file    Active member of club or organization: Not on file    Attends meetings of clubs or organizations: Not on file    Relationship status: Not on file  . Intimate partner violence:    Fear of current or ex partner: Not on file    Emotionally abused: Not on file    Physically abused: Not on file    Forced sexual activity: Not on file  Other Topics Concern  . Not on file  Social History Narrative  . Not on file    Past Surgical History:  Procedure Laterality Date  . BREAST BIOPSY Left    neg  . CHOLECYSTECTOMY  1992    Family History  Problem Relation Age of Onset  . Breast cancer Daughter 52       [place],  genetic test negative    Allergies  Allergen Reactions  . Lisinopril     Other reaction(s): Cough       Assessment & Plan:   1. Chronic venous insufficiency No surgery or intervention at this point in time.  I have reviewed my discussion with the patient regarding venous insufficiency and why it causes symptoms. I have discussed with the patient the chronic skin changes that accompany venous insufficiency and the long term sequela such as ulceration. Patient will contnue wearing graduated compression stockings on a  daily basis, as this has provided excellent control of his edema. The patient will put the stockings on first thing in the morning and removing them in the evening. The patient is reminded not to sleep in the stockings.  In addition, behavioral modification including elevation during the day will be initiated. Exercise is strongly encouraged.   Given the patient's good control and lack of any problems regarding the venous insufficiency and lymphedema a lymph pump in not need at this time.  The patient will follow up with me PRN should anything change.  The patient voices agreement with this plan.   2. Claudication of calf muscles (HCC) The patient's pain and discomfort are not typical of peripheral vascular disease.  The patient's ABIs today further reveal that there is no evidence of significant lower extremity arterial disease.  I suggested that she follow-up with her primary care physician in order to further explore other possibilities for this pain, including musculoskeletal causes or neurological issues.  3. Essential hypertension Patient had an elevated blood pressure today.  She did not have any headaches or neurological changes.  The patient states that this is not a common reading for her.  She does not currently endorse taking any medications.  She states that her systolic generally runs in the low 100s.  I suggested that she follow-up with her primary care  physician for blood pressure control as well   4. Osteoarthritis of both knees, unspecified osteoarthritis type Continue NSAID medications as already ordered, these medications have been reviewed and there are no changes at this time.  Continued activity and therapy was stressed.    Current Outpatient Medications on File Prior to Visit  Medication Sig Dispense Refill  . Ascorbic Acid (VITAMIN C) 1000 MG tablet Take by mouth.    Marland Kitchen azelastine (ASTELIN) 0.1 % nasal spray PLACE 1 SPRAY INTO BOTH NOSTRILS 2 (TWO) TIMES DAILY  1  . Cholecalciferol (VITAMIN D) 1000 UNITS capsule Take 1,000 Units by mouth.    . COD LIVER OIL PO Take by mouth.    . diclofenac sodium (VOLTAREN) 1 % GEL diclofenac 1 % topical gel  APPLY 2 GRAMS TO AFFECTED AREA FOUR TIMES A DAY    . Flax OIL Take by mouth.    . magnesium 30 MG tablet Take 30 mg by mouth.    . Magnesium Gluconate 550 MG TABS Take by mouth.    . meloxicam (MOBIC) 15 MG tablet Mobic 15 mg tablet  Take 1 tablet every day by oral route.    . Multiple Vitamin (MULTIVITAMIN) tablet Take 1 tablet by mouth.     . potassium gluconate (RA POTASSIUM GLUCONATE) 595 (99 K) MG TABS tablet Take by mouth.    . predniSONE (DELTASONE) 20 MG tablet prednisone 20 mg tablet    . vitamin B-12 (CYANOCOBALAMIN) 100 MCG tablet Take 50 mcg by mouth daily.    Marland Kitchen VITAMIN E PO Take 1 tablet by mouth.     . chlorpheniramine-HYDROcodone (TUSSIONEX) 10-8 MG/5ML SUER TAKE 5 MILLILITERS BY MOUTH EVERY 12 HOURS AS NEEDED FOR COUGH  0   No current facility-administered medications on file prior to visit.     There are no Patient Instructions on file for this visit. Return in about 1 year (around 03/19/2019).   Kris Hartmann, NP

## 2018-10-25 ENCOUNTER — Other Ambulatory Visit: Payer: Self-pay | Admitting: Family Medicine

## 2018-10-25 DIAGNOSIS — Z1231 Encounter for screening mammogram for malignant neoplasm of breast: Secondary | ICD-10-CM

## 2018-11-04 ENCOUNTER — Other Ambulatory Visit: Payer: Self-pay | Admitting: Gastroenterology

## 2018-11-04 ENCOUNTER — Encounter (INDEPENDENT_AMBULATORY_CARE_PROVIDER_SITE_OTHER): Payer: Self-pay

## 2018-11-04 ENCOUNTER — Ambulatory Visit
Admission: RE | Admit: 2018-11-04 | Discharge: 2018-11-04 | Disposition: A | Discharge status: Home / Self Care | Payer: Medicare Other | Source: Ambulatory Visit | Attending: Gastroenterology | Admitting: Gastroenterology

## 2018-11-04 ENCOUNTER — Other Ambulatory Visit: Payer: Self-pay

## 2018-11-04 DIAGNOSIS — R1013 Epigastric pain: Secondary | ICD-10-CM | POA: Diagnosis not present

## 2018-11-04 DIAGNOSIS — R945 Abnormal results of liver function studies: Secondary | ICD-10-CM

## 2018-11-04 DIAGNOSIS — R7989 Other specified abnormal findings of blood chemistry: Secondary | ICD-10-CM

## 2018-11-04 DIAGNOSIS — I81 Portal vein thrombosis: Secondary | ICD-10-CM

## 2018-11-04 DIAGNOSIS — R1084 Generalized abdominal pain: Secondary | ICD-10-CM

## 2018-11-05 ENCOUNTER — Other Ambulatory Visit: Payer: Self-pay

## 2018-11-05 ENCOUNTER — Ambulatory Visit
Admission: RE | Admit: 2018-11-05 | Discharge: 2018-11-05 | Disposition: A | Discharge status: Home / Self Care | Payer: Medicare Other | Source: Ambulatory Visit | Attending: Gastroenterology | Admitting: Gastroenterology

## 2018-11-05 DIAGNOSIS — I81 Portal vein thrombosis: Secondary | ICD-10-CM | POA: Diagnosis present

## 2018-11-05 DIAGNOSIS — R1084 Generalized abdominal pain: Secondary | ICD-10-CM

## 2018-11-05 DIAGNOSIS — R945 Abnormal results of liver function studies: Secondary | ICD-10-CM | POA: Insufficient documentation

## 2018-11-05 DIAGNOSIS — R7989 Other specified abnormal findings of blood chemistry: Secondary | ICD-10-CM

## 2018-11-05 HISTORY — DX: Essential (primary) hypertension: I10

## 2018-11-05 MED ORDER — IOPAMIDOL (ISOVUE-300) INJECTION 61%
100.0000 mL | Freq: Once | INTRAVENOUS | Status: AC | PRN
  Administered 2018-11-05: 11:00:00 100 mL via INTRAVENOUS

## 2018-11-08 ENCOUNTER — Other Ambulatory Visit: Payer: Self-pay | Admitting: Gastroenterology

## 2018-11-08 DIAGNOSIS — I81 Portal vein thrombosis: Secondary | ICD-10-CM

## 2018-11-11 ENCOUNTER — Other Ambulatory Visit: Payer: Self-pay

## 2018-11-11 ENCOUNTER — Ambulatory Visit
Admission: RE | Admit: 2018-11-11 | Discharge: 2018-11-11 | Disposition: A | Discharge status: Home / Self Care | Payer: Medicare Other | Source: Ambulatory Visit | Attending: Gastroenterology | Admitting: Gastroenterology

## 2018-11-11 DIAGNOSIS — I81 Portal vein thrombosis: Secondary | ICD-10-CM | POA: Insufficient documentation

## 2018-11-11 MED ORDER — GADOBUTROL 1 MMOL/ML IV SOLN
9.0000 mL | Freq: Once | INTRAVENOUS | Status: AC | PRN
  Administered 2018-11-11: 9 mL via INTRAVENOUS

## 2018-11-23 ENCOUNTER — Other Ambulatory Visit: Payer: Self-pay | Admitting: Oncology

## 2018-11-24 ENCOUNTER — Encounter: Payer: Self-pay | Admitting: Licensed Clinical Social Worker

## 2018-11-25 ENCOUNTER — Encounter: Payer: Self-pay | Admitting: Emergency Medicine

## 2018-11-25 ENCOUNTER — Encounter: Payer: Self-pay | Admitting: Oncology

## 2018-11-25 ENCOUNTER — Other Ambulatory Visit: Payer: Medicare Other

## 2018-11-25 ENCOUNTER — Inpatient Hospital Stay: Payer: Medicare Other | Attending: Oncology | Admitting: Oncology

## 2018-11-25 ENCOUNTER — Other Ambulatory Visit: Payer: Self-pay

## 2018-11-25 ENCOUNTER — Emergency Department
Admission: EM | Admit: 2018-11-25 | Discharge: 2018-11-25 | Disposition: A | Discharge status: Home / Self Care | Payer: Medicare Other | Source: Home / Self Care

## 2018-11-25 DIAGNOSIS — Z79899 Other long term (current) drug therapy: Secondary | ICD-10-CM | POA: Insufficient documentation

## 2018-11-25 DIAGNOSIS — M5136 Other intervertebral disc degeneration, lumbar region: Secondary | ICD-10-CM | POA: Insufficient documentation

## 2018-11-25 DIAGNOSIS — E876 Hypokalemia: Secondary | ICD-10-CM | POA: Diagnosis not present

## 2018-11-25 DIAGNOSIS — Z823 Family history of stroke: Secondary | ICD-10-CM | POA: Insufficient documentation

## 2018-11-25 DIAGNOSIS — I2510 Atherosclerotic heart disease of native coronary artery without angina pectoris: Secondary | ICD-10-CM | POA: Diagnosis not present

## 2018-11-25 DIAGNOSIS — Z9049 Acquired absence of other specified parts of digestive tract: Secondary | ICD-10-CM | POA: Insufficient documentation

## 2018-11-25 DIAGNOSIS — D509 Iron deficiency anemia, unspecified: Secondary | ICD-10-CM | POA: Diagnosis not present

## 2018-11-25 DIAGNOSIS — D72829 Elevated white blood cell count, unspecified: Secondary | ICD-10-CM | POA: Diagnosis not present

## 2018-11-25 DIAGNOSIS — R160 Hepatomegaly, not elsewhere classified: Secondary | ICD-10-CM | POA: Insufficient documentation

## 2018-11-25 DIAGNOSIS — Z5321 Procedure and treatment not carried out due to patient leaving prior to being seen by health care provider: Secondary | ICD-10-CM | POA: Insufficient documentation

## 2018-11-25 DIAGNOSIS — K5900 Constipation, unspecified: Secondary | ICD-10-CM | POA: Insufficient documentation

## 2018-11-25 DIAGNOSIS — I81 Portal vein thrombosis: Secondary | ICD-10-CM | POA: Diagnosis not present

## 2018-11-25 DIAGNOSIS — R1084 Generalized abdominal pain: Secondary | ICD-10-CM

## 2018-11-25 DIAGNOSIS — R509 Fever, unspecified: Secondary | ICD-10-CM | POA: Insufficient documentation

## 2018-11-25 DIAGNOSIS — G8929 Other chronic pain: Secondary | ICD-10-CM | POA: Diagnosis not present

## 2018-11-25 DIAGNOSIS — Z803 Family history of malignant neoplasm of breast: Secondary | ICD-10-CM | POA: Insufficient documentation

## 2018-11-25 DIAGNOSIS — I700 Atherosclerosis of aorta: Secondary | ICD-10-CM | POA: Diagnosis not present

## 2018-11-25 DIAGNOSIS — Z820 Family history of epilepsy and other diseases of the nervous system: Secondary | ICD-10-CM

## 2018-11-25 DIAGNOSIS — R1013 Epigastric pain: Secondary | ICD-10-CM | POA: Insufficient documentation

## 2018-11-25 DIAGNOSIS — Z888 Allergy status to other drugs, medicaments and biological substances status: Secondary | ICD-10-CM | POA: Diagnosis not present

## 2018-11-25 DIAGNOSIS — Q453 Other congenital malformations of pancreas and pancreatic duct: Secondary | ICD-10-CM | POA: Diagnosis not present

## 2018-11-25 LAB — COMPREHENSIVE METABOLIC PANEL
ALT: 39 U/L (ref 0–44)
AST: 87 U/L — ABNORMAL HIGH (ref 15–41)
Albumin: 3.3 g/dL — ABNORMAL LOW (ref 3.5–5.0)
Alkaline Phosphatase: 212 U/L — ABNORMAL HIGH (ref 38–126)
Anion gap: 11 (ref 5–15)
BUN: 6 mg/dL — ABNORMAL LOW (ref 8–23)
CO2: 26 mmol/L (ref 22–32)
Calcium: 9.4 mg/dL (ref 8.9–10.3)
Chloride: 100 mmol/L (ref 98–111)
Creatinine, Ser: 0.61 mg/dL (ref 0.44–1.00)
GFR calc Af Amer: >60 mL/min (ref >60)
GFR calc non Af Amer: >60 mL/min (ref >60)
Glucose, Bld: 112 mg/dL — ABNORMAL HIGH (ref 70–99)
Potassium: 3.3 mmol/L — ABNORMAL LOW (ref 3.5–5.1)
Sodium: 137 mmol/L (ref 135–145)
Total Bilirubin: 1.3 mg/dL — ABNORMAL HIGH (ref 0.3–1.2)
Total Protein: 8.6 g/dL — ABNORMAL HIGH (ref 6.5–8.1)

## 2018-11-25 LAB — CBC
HCT: 36.3 % (ref 36.0–46.0)
Hemoglobin: 11.3 g/dL — ABNORMAL LOW (ref 12.0–15.0)
MCH: 24.1 pg — ABNORMAL LOW (ref 26.0–34.0)
MCHC: 31.1 g/dL (ref 30.0–36.0)
MCV: 77.4 fL — ABNORMAL LOW (ref 80.0–100.0)
Platelets: 429 10*3/uL — ABNORMAL HIGH (ref 150–400)
RBC: 4.69 MIL/uL (ref 3.87–5.11)
RDW: 15.9 % — ABNORMAL HIGH (ref 11.5–15.5)
WBC: 21.3 10*3/uL — ABNORMAL HIGH (ref 4.0–10.5)
nRBC: 0 % (ref 0.0–0.2)

## 2018-11-25 LAB — URINALYSIS, COMPLETE (UACMP) WITH MICROSCOPIC
Glucose, UA: NEGATIVE mg/dL
Hgb urine dipstick: NEGATIVE
Ketones, ur: 5 mg/dL — AB
Nitrite: NEGATIVE
Protein, ur: 100 mg/dL — AB
Specific Gravity, Urine: 1.026 (ref 1.005–1.030)
pH: 5 (ref 5.0–8.0)

## 2018-11-25 LAB — LIPASE, BLOOD: Lipase: 21 U/L (ref 11–51)

## 2018-11-25 MED ORDER — TRAMADOL HCL 50 MG PO TABS: 50.0000 mg | ORAL_TABLET | Freq: Three times a day (TID) | ORAL | 0 refills | Status: AC | PRN

## 2018-11-25 NOTE — ED Triage Notes (Signed)
Here for pain across whole lower abdomen. Was at doctor today and they told her that her temp was 101.  She was there for routine FU on liver has had no complaints except some lower abdominal cramping with using restroom.  No cough or SHOB.  Ambulatory. NAD. Temp 99.6 here, no antipyretics given.

## 2018-11-25 NOTE — Progress Notes (Signed)
Patient contacted via phone for telehealth visit. Pt with fever, denies cough.

## 2018-11-25 NOTE — Progress Notes (Signed)
Tumor Board Documentation  Abigail Ayers was presented by Dr Tasia Catchings at our Tumor Board on 11/25/2018, which included representatives from medical oncology, radiation oncology, surgical, radiology, pathology, genetics, internal medicine, navigation, research, palliative care, pulmonology.  [name] currently presents as a new patient, for discussion with history of the following treatments: active survellience.  Additionally, we reviewed previous medical and familial history, history of present illness, and recent lab results along with all available histopathologic and imaging studies. The tumor board considered available treatment options and made the following recommendations: Biopsy, Active surveillance Anticoagulation, then Reimage vs Biopsy and treatment with Immunotherapy depending on discussion with reading Radiologist if he feels this is typical [facility]  The following procedures/referrals were also placed: No orders of the defined types were placed in this encounter.   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  Tumor board is a meeting of clinicians from various specialty areas who evaluate and discuss patients for whom a multidisciplinary approach is being considered. Final determinations in the plan of care are those of the provider(s). The responsibility for follow up of recommendations given during tumor board is that of the provider.   Today's extended care, comprehensive team conference, [place] was not present for the discussion and was not examined.   Multidisciplinary Tumor Board is a multidisciplinary case peer review process.  Decisions discussed in the Multidisciplinary Tumor Board reflect the opinions of the specialists present at the conference without having examined the patient.  Ultimately, treatment and diagnostic decisions rest  with the primary provider(s) and the patient.

## 2018-11-26 NOTE — Progress Notes (Signed)
HEMATOLOGY-ONCOLOGY TeleHEALTH VISIT INITIAL CONSULTATION  I connected with Abigail Ayers on 11/28/18 at  2:00 PM EDT by video enabled telemedicine visit and verified that I am speaking with the correct person using two identifiers. I discussed the limitations, risks, security and privacy concerns of performing an evaluation and management service by telemedicine and the availability of in-person appointments. I also discussed with the patient that there may be a patient responsible charge related to this service. The patient expressed understanding and agreed to proceed.   Other persons participating in the visit and their role in the encounter:  Abigail Merl, RN, check in patient.   Patient's location: cancer center lobby Provider's location: work Referring provider: Geanie Ayers, [place]* Dr.Toledo  Chief Complaint:  Liver mass  HISTORY OF PRESENT ILLNESS Abigail Ayers is a 73 y.o. female who was seen in consultation at the request of Dr.Toledo for evaluation of liver mass. Patient has a history of hepatitis C, status post Harvoni treatment finished in 2016. She has developed generalized abdominal pain for the past 3 months, was seen by gastroenterology Dr. Alice Reichert in May 2020.  Stat right upper quadrant ultrasound showed questionable portal vein thrombus of May in the left portal vein.  Patient subsequently had CT abdomen with contrast with liver protocol for further evaluation. I independently reviewed patient's images listed below. 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  No definitive stigmata of cirrhosis/portal venous hypertension or discrete hepatic lesion.  11/11/2018 abdomen MRCP with and without contrast showed 2.4 cm hypovascular mass in segment 8 of right lobe.  Differential diagnosis include hepatocellular carcinoma, cholangiocarcinoma and metastatic disease.  Portal vein thrombus which  demonstrate contrast enhancement consistent with tumor thrombus. Patient was referred to oncology for further evaluation.  Today patient reported to the lobby of cancer center.  She was noted to have a fever of 101.  Patient denies any shortness of breath, cough, recent sick contact.  Due to cancer center COVID 19 policy, patient cannot enter cancer center due to the fever. Her visit was transformed to a virtual visit with the help of RN Doni.  Patient reports ongoing chronic abdominal pain for which she takes NSAIDs with no good control. She is not aware that she is having fever.  No respiratory symptoms.  Appetite is not very good Denies any significant weight loss. Review of Systems  Constitutional: Positive for appetite change and fever. Negative for chills and fatigue.  HENT:   Negative for hearing loss and voice change.   Eyes: Negative for eye problems.  Respiratory: Negative for chest tightness and cough.   Cardiovascular: Negative for chest pain.  Gastrointestinal: Positive for abdominal pain. Negative for abdominal distention and blood in stool.  Endocrine: Negative for hot flashes.  Genitourinary: Negative for difficulty urinating and frequency.   Musculoskeletal: Negative for arthralgias.  Skin: Negative for itching and rash.  Neurological: Negative for extremity weakness.  Hematological: Negative for adenopathy.  Psychiatric/Behavioral: Negative for confusion.    Past Medical History:  Diagnosis Date  . Hypertension    not currently taking meds   Past Surgical History:  Procedure Laterality Date  . BREAST BIOPSY Left    neg  . CHOLECYSTECTOMY  1992    Family History  Problem Relation Age of Onset  . Alzheimer's disease Mother   . Stroke Father   . Breast cancer Daughter 98       [place], genetic test negative  Social History   Socioeconomic History  . Marital status: Widowed    Spouse name: Not on file  . Number of children: Not on file  . Years of  education: Not on file  . Highest education level: Not on file  Occupational History  . Not on file  Social Needs  . Financial resource strain: Not on file  . Food insecurity:    Worry: Not on file    Inability: Not on file  . Transportation needs:    Medical: Not on file    Non-medical: Not on file  Tobacco Use  . Smoking status: Never Smoker  . Smokeless tobacco: Never Used  Substance and Sexual Activity  . Alcohol use: No  . Drug use: No  . Sexual activity: Not on file  Lifestyle  . Physical activity:    Days per week: Not on file    Minutes per session: Not on file  . Stress: Not on file  Relationships  . Social connections:    Talks on phone: Not on file    Gets together: Not on file    Attends religious service: Not on file    Active member of club or organization: Not on file    Attends meetings of clubs or organizations: Not on file    Relationship status: Not on file  . Intimate partner violence:    Fear of current or ex partner: Not on file    Emotionally abused: Not on file    Physically abused: Not on file    Forced sexual activity: Not on file  Other Topics Concern  . Not on file  Social History Narrative  . Not on file    Current Outpatient Medications on File Prior to Visit  Medication Sig Dispense Refill  . Ascorbic Acid (VITAMIN C) 1000 MG tablet Take by mouth.    Marland Kitchen azelastine (ASTELIN) 0.1 % nasal spray PLACE 1 SPRAY INTO BOTH NOSTRILS 2 (TWO) TIMES DAILY  1  . Cholecalciferol (VITAMIN D) 1000 UNITS capsule Take 1,000 Units by mouth.    . COD LIVER OIL PO Take by mouth.    . Flax OIL Take by mouth.    . magnesium 30 MG tablet Take 30 mg by mouth.    . Multiple Vitamin (MULTIVITAMIN) tablet Take 1 tablet by mouth.     . pantoprazole (PROTONIX) 20 MG tablet Take 20 mg by mouth daily.    . potassium gluconate (RA POTASSIUM GLUCONATE) 595 (99 K) MG TABS tablet Take by mouth.    . vitamin B-12 (CYANOCOBALAMIN) 100 MCG tablet Take 50 mcg by mouth  daily.    Marland Kitchen VITAMIN E PO Take 1 tablet by mouth.      No current facility-administered medications on file prior to visit.     Allergies  Allergen Reactions  . Lisinopril     Other reaction(s): Cough       Observations/Objective: There were no vitals filed for this visit. There is no height or weight on file to calculate BMI.  Physical Exam  Constitutional: She is oriented to person, place, and time. No distress.  HENT:  Head: Normocephalic and atraumatic.  Pulmonary/Chest: Effort normal.  Neurological: She is alert and oriented to person, place, and time.  Psychiatric: Affect normal.    I have personally reviewed below laboratory results.  CBC    Component Value Date/Time   WBC 21.3 (H) 11/25/2018 1521   RBC 4.69 11/25/2018 1521   HGB 11.3 (L) 11/25/2018 1521  HCT 36.3 11/25/2018 1521   PLT 429 (H) 11/25/2018 1521   MCV 77.4 (L) 11/25/2018 1521   MCH 24.1 (L) 11/25/2018 1521   MCHC 31.1 11/25/2018 1521   RDW 15.9 (H) 11/25/2018 1521    CMP     Component Value Date/Time   NA 137 11/25/2018 1521   K 3.3 (L) 11/25/2018 1521   CL 100 11/25/2018 1521   CO2 26 11/25/2018 1521   GLUCOSE 112 (H) 11/25/2018 1521   BUN 6 (L) 11/25/2018 1521   CREATININE 0.61 11/25/2018 1521   CALCIUM 9.4 11/25/2018 1521   PROT 8.6 (H) 11/25/2018 1521   ALBUMIN 3.3 (L) 11/25/2018 1521   AST 87 (H) 11/25/2018 1521   ALT 39 11/25/2018 1521   ALKPHOS 212 (H) 11/25/2018 1521   BILITOT 1.3 (H) 11/25/2018 1521   GFRNONAA >60 11/25/2018 1521   GFRAA >60 11/25/2018 1521     RADIOGRAPHIC STUDIES: I have personally reviewed the radiological images as listed and agreed with the findings in the report.  Ct Abdomen Pelvis W Contrast  Result Date: 11/05/2018 CLINICAL DATA:  Abdominal pain and constipation. Elevated bilirubin level. Questionable portal vein thrombus demonstrated on right upper quadrant abdominal ultrasound performed 11/04/2018. EXAM: CT ABDOMEN AND PELVIS WITH CONTRAST  TECHNIQUE: Multidetector CT imaging of the abdomen and pelvis was performed using the standard protocol following bolus administration of intravenous contrast. CONTRAST:  145mL ISOVUE-300 IOPAMIDOL (ISOVUE-300) INJECTION 61% COMPARISON:  Right upper quadrant abdominal ultrasound-11/04/2018; CT abdomen and pelvis-03/11/2010 FINDINGS: Lower chest: Limited visualization the lower thorax demonstrates minimal dependent subpleural ground-glass atelectasis. No focal airspace opacities. No pleural effusion. Normal heart size. Coronary artery calcifications. No pericardial effusion. Hepatobiliary: Multiphase contrast-enhanced CT scan confirms suspicion of extensive predominantly occlusive portal venous thrombus extending from the central aspect of the main portal vein into both the right and left portal veins as well as many of its segmental branches bilaterally (best seen on coronal images 35 through 50, series 13). This finding is associated with cavernous transformation suggesting an element of chronicity. While discrete hyperenhancing hepatic lesions are not identified, perfusion abnormalities are noted within the subcapsular aspect of the posterior segment of the right lobe of the liver as sequela of portal venous thrombosis. There is an ill-defined area of hypoattenuation involving the central aspect the right lobe liver (best seen on image 14, series 6), though a definitive lesion is not identified, though note evaluation is degraded secondary to altered perfusion the hepatic parenchyma in the setting portal venous thrombosis and cavernous transformation. Post cholecystectomy. No intra or extrahepatic biliary duct dilatation. Trace amount of fluid is seen within the right pericolic gutter though there is no significant intra-abdominal ascites. Pancreas: Normal appearance of the pancreas. No definitive pancreatic atrophy are ductal dilatation. No definitive peripancreatic stranding. Spleen: Normal appearance of the  spleen without evidence of splenomegaly. Adrenals/Urinary Tract: There is symmetric enhancement and excretion of the bilateral kidneys. No renal stones. No discrete renal lesions. No urine obstruction or perinephric stranding. Normal appearance the bilateral adrenal glands. Normal appearance of the urinary bladder given underdistention. Stomach/Bowel: Ingested enteric contrast extends to the level of the rectum. No evidence of enteric obstruction. Normal appearance of the terminal ileum and the retrocecal appendix. No pneumoperitoneum, pneumatosis or portal venous gas. Vascular/Lymphatic: Atherosclerotic plaque within a normal caliber abdominal aorta. The major branch vessels of the abdominal aorta appear patent on this non CTA examination. Extensive portal venous thrombosis with associated cavernous transformation as detailed in the hepatobiliary section of the  above report. The IVC and pelvic venous system appear patent. Reproductive: Normal appearance of the pelvic organs. No discrete adnexal lesion. No free fluid within the pelvic cul-de-sac. Other: Regional soft tissues appear normal. Musculoskeletal: No acute or aggressive osseous abnormalities. Mild-to-moderate multilevel lumbar spine DDD, worse at T11-T12 with disc space height loss, endplate irregularity and sclerosis. Bilateral facet degenerative change of the lower lumbar spine. IMPRESSION: 1. 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 The exact etiology of the portal venous thrombosis is not depicted on this examination, specifically, no definitive stigmata of cirrhosis/portal venous hypertension or discrete hepatic lesion, though evaluation is degraded secondary to altered perfusion of the hepatic parenchyma in the setting of portal venous thrombosis. Further evaluation with the acquisition of an AFP level as well as MRCP could be performed as clinically indicated. 2. Post cholecystectomy. No  intra or extrahepatic biliary ductal dilatation. No definitive pancreatic abnormality. 3. Coronary calcifications.  Aortic Atherosclerosis (ICD10-I70.0). These results will be called to the ordering clinician or representative by the Radiologist Assistant, and communication documented in the PACS or zVision Dashboard. Electronically Signed   By: Sandi Mariscal M.D.   On: 11/05/2018 11:48   Mr Abdomen Mrcp Moise Boring Contast  Addendum Date: 11/26/2018   ADDENDUM REPORT: 11/26/2018 20:44 ADDENDUM: Upon further review, the 2.4 cm mass described in segment 8 also appears to represent enhancing tumor thrombus, as demonstrated elsewhere throughout the right, left, and main portal veins. The hepatic neoplasm is much larger and very infiltrative in appearance, best measured on opposed-phase gradient echo sequence #4/image #10, at 8.0 x 11.5 cm. This involves segments 7, 8 and 4A/B, and is highly suspicious for an infiltrative cholangiocarcinoma or possibly hepatocellular carcinoma. This exam was also reviewed by Drs. Barry Dienes and Jobe Igo. Electronically Signed   By: Earle Gell M.D.   On: 11/26/2018 20:44   Result Date: 11/26/2018 CLINICAL DATA:  Abdominal pain. Portal vein thrombosis seen on recent CT. Evaluate for neoplasm. EXAM: MRI ABDOMEN WITHOUT AND WITH CONTRAST (INCLUDING MRCP) TECHNIQUE: Multiplanar multisequence MR imaging of the abdomen was performed both before and after the administration of intravenous contrast. Heavily T2-weighted images of the biliary and pancreatic ducts were obtained, and three-dimensional MRCP images were rendered by post processing. CONTRAST:  9 mL Gadavist COMPARISON:  CT on 11/05/2018 FINDINGS: Lower chest: No acute findings. Hepatobiliary: Diffuse portal vein thrombosis is seen. The thrombus appears to show some contrast enhancement on subtraction imaging, consistent with tumor thrombus. A heterogeneously enhancing hypovascular mass is seen in segment 8 on image 21/18, which measures 2.4 x  2.3 cm. This is consistent with hepatic neoplasm. No other definite hepatic masses are identified. No gross morphologic features of cirrhosis are seen. Prior cholecystectomy noted. No evidence of biliary ductal dilatation. Pancreas: No mass or inflammatory changes. No evidence of pancreatic ductal dilatation. Pancreas divisum noted. Spleen:  Within normal limits in size and appearance. Adrenals/Urinary Tract: No masses identified. No evidence of hydronephrosis. Stomach/Bowel: Visualized portion unremarkable. Vascular/Lymphatic: No pathologically enlarged lymph nodes identified. No abdominal aortic aneurysm. Other:  None. Musculoskeletal:  No suspicious bone lesions identified. IMPRESSION: 2.4 cm hypovascular mass in segment 8 of the right lobe. Differential diagnosis includes hepatocellular carcinoma, cholangiocarcinoma, and metastatic disease. 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 No evidence of lymphadenopathy or other abdominal metastatic disease. Electronically Signed: By: Earle Gell M.D. On: 11/11/2018 16:06   US Abdomen Limited Ruq  Result Date: 11/04/2018 CLINICAL DATA:  73 year old  female with a history of epigastric abdominal pain EXAM: ULTRASOUND ABDOMEN LIMITED RIGHT UPPER QUADRANT COMPARISON:  CT 03/11/2010 FINDINGS: Gallbladder: Cholecystectomy Common bile duct: Diameter: 2 mm Liver: Increased echogenicity of liver parenchyma. There is questionable portal vein thrombus of the main and left portal vein. IMPRESSION: Cholecystectomy. Questionable portal vein thrombus. Further evaluation with contrast-enhanced CT or MR may be considered. Increased echogenicity of liver parenchyma, potentially steatosis or other medical liver disease. Electronically Signed   By: Corrie Mckusick D.O.   On: 11/04/2018 11:05   Labs at [facility] 11/19/2018 AFP [id number]   Assessment and Plan: 1. Liver mass   2. Generalized abdominal pain   3.  Fever, unspecified fever cause   4. Microcytic anemia   5. Leukocytosis, unspecified type     Images were independent reviewed by me and discussed with patient. Labs are reviewed. Concerning liver mass, likely HCC with significant high AFP of 8616. I discussed patient case with radiology on tumor board on 11/25/2018.  discussion, MRI MRCP report was addended 11/11/2018 MRI abdomen MRCP with and without contrast showed 2.4 cm mass described in segment 8 is suspicious for an infiltrative cholangiocarcinoma or possibly hepatocellular carcinoma.   I recommend biopsy to clarification of histology. Likely Jacona Will refer to liver surgeon for an opinion.   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  # abdominal pain, patient request stronger pain medication. Tramadol 50mg  Q8h as needed dispense 30 tablets.  # Fever, unknown etiology, she can not have labs done here at cancer center, we referred patient to go to emergency room for further evaluation.  At emergency room, her temperature was measured and was normal.  Patient had labs done and was released to home.  Reviewed patient's lab was done on 11/25/2018 in the emergency room.  She has mild hypokalemia.  Bilirubin 1.3, AST elevated at 87. Patient has a leukocytosis of 21.3, hemoglobin 11.3, MCV 77.4.  Platelet counts slightly increased at 429. Leukocytosis possibly reactive. Infection cannot be excluded.  Reports having no symptoms.  afebrile in the emergency room.  Plan: Proceed with CT-guided biopsy of the liver lesion to clarify histology.  Follow Up Instructions: Schedule CT-guided liver biopsy to  clarify histology. Refer to [facility] for an opinion.  Cc PCP, Dr. Alice Reichert and Dr. Barry Dienes. Follow up 1 week after CT biopsy I discussed the assessment and treatment plan with the patient. The patient was provided an opportunity to ask questions and all were answered. The patient agreed with the plan and demonstrated an  understanding of the instructions.  The patient was advised to call back or seek an in-person evaluation if the symptoms worsen or if the condition fails to improve as anticipated.   Earlie Server, MD 11/28/2018 3:32 PM

## 2018-11-27 ENCOUNTER — Encounter: Payer: Self-pay | Admitting: Oncology

## 2018-11-27 DIAGNOSIS — Z7189 Other specified counseling: Secondary | ICD-10-CM | POA: Insufficient documentation

## 2018-11-30 ENCOUNTER — Telehealth: Payer: Self-pay

## 2018-11-30 ENCOUNTER — Other Ambulatory Visit: Payer: Self-pay | Admitting: *Deleted

## 2018-11-30 ENCOUNTER — Telehealth: Payer: Self-pay | Admitting: *Deleted

## 2018-11-30 ENCOUNTER — Other Ambulatory Visit: Payer: Self-pay | Admitting: Oncology

## 2018-11-30 DIAGNOSIS — R160 Hepatomegaly, not elsewhere classified: Secondary | ICD-10-CM

## 2018-11-30 NOTE — Telephone Encounter (Signed)
Referral sent to [facility] surgery (Dr. Stark Klein) for liver mass. Fax confirmation received.

## 2018-11-30 NOTE — Telephone Encounter (Signed)
Per Dr. Tasia Catchings refer patient to Dr. Barry Dienes (surgeon) and schedule CT biopsy. Both have been requested. I called patient and notified her.

## 2018-11-30 NOTE — Telephone Encounter (Signed)
Patient called requesting a call back to clarify her last visit instructions. Please return her call [phone]

## 2018-12-03 ENCOUNTER — Other Ambulatory Visit: Payer: Self-pay | Admitting: Student

## 2018-12-06 ENCOUNTER — Other Ambulatory Visit: Payer: Self-pay

## 2018-12-06 ENCOUNTER — Ambulatory Visit
Admission: RE | Admit: 2018-12-06 | Discharge: 2018-12-06 | Disposition: A | Discharge status: Home / Self Care | Payer: Medicare Other | Source: Ambulatory Visit | Attending: Oncology | Admitting: Oncology

## 2018-12-06 DIAGNOSIS — Z79899 Other long term (current) drug therapy: Secondary | ICD-10-CM | POA: Insufficient documentation

## 2018-12-06 DIAGNOSIS — R160 Hepatomegaly, not elsewhere classified: Secondary | ICD-10-CM | POA: Diagnosis present

## 2018-12-06 DIAGNOSIS — C220 Liver cell carcinoma: Secondary | ICD-10-CM | POA: Diagnosis not present

## 2018-12-06 DIAGNOSIS — I10 Essential (primary) hypertension: Secondary | ICD-10-CM | POA: Insufficient documentation

## 2018-12-06 LAB — CBC
HCT: 33.8 % — ABNORMAL LOW (ref 36.0–46.0)
Hemoglobin: 10.4 g/dL — ABNORMAL LOW (ref 12.0–15.0)
MCH: 23.7 pg — ABNORMAL LOW (ref 26.0–34.0)
MCHC: 30.8 g/dL (ref 30.0–36.0)
MCV: 77 fL — ABNORMAL LOW (ref 80.0–100.0)
Platelets: 552 10*3/uL — ABNORMAL HIGH (ref 150–400)
RBC: 4.39 MIL/uL (ref 3.87–5.11)
RDW: 16.8 % — ABNORMAL HIGH (ref 11.5–15.5)
WBC: 12 10*3/uL — ABNORMAL HIGH (ref 4.0–10.5)
nRBC: 0 % (ref 0.0–0.2)

## 2018-12-06 LAB — PROTIME-INR
INR: 1.1 (ref 0.8–1.2)
Prothrombin Time: 14.4 seconds (ref 11.4–15.2)

## 2018-12-06 MED ORDER — FENTANYL CITRATE (PF) 100 MCG/2ML IJ SOLN
INTRAMUSCULAR | Status: AC | PRN
  Administered 2018-12-06: 25 ug via INTRAVENOUS
  Administered 2018-12-06: 50 ug via INTRAVENOUS

## 2018-12-06 MED ORDER — FENTANYL CITRATE (PF) 100 MCG/2ML IJ SOLN
INTRAMUSCULAR | Status: AC
  Filled 2018-12-06: qty 2

## 2018-12-06 MED ORDER — MIDAZOLAM HCL 2 MG/2ML IJ SOLN
INTRAMUSCULAR | Status: AC | PRN
  Administered 2018-12-06: 1 mg via INTRAVENOUS
  Administered 2018-12-06: 0.5 mg via INTRAVENOUS

## 2018-12-06 MED ORDER — MIDAZOLAM HCL 2 MG/2ML IJ SOLN
INTRAMUSCULAR | Status: AC
  Filled 2018-12-06: qty 4

## 2018-12-06 MED ORDER — SODIUM CHLORIDE 0.9 % IV SOLN
INTRAVENOUS | Status: DC
  Administered 2018-12-06: 08:00:00 via INTRAVENOUS

## 2018-12-06 NOTE — Procedures (Signed)
Interventional Radiology Procedure Note  Procedure: US guided liver mass biopsy.  Multiple biopsy of ill-defined right liver lesion. .  Complications: None Recommendations:  - Ok to shower tomorrow - Do not submerge for 7 days - Routine care  - 2 hours observation - advance diet  Signed,  Dulcy Fanny. Earleen Newport, DO

## 2018-12-06 NOTE — Discharge Instructions (Signed)
Liver Biopsy, Care After °These instructions give you information about how to care for yourself after your procedure. Your health care provider may also give you more specific instructions. If you have problems or questions, contact your health care provider. °What can I expect after the procedure? °After your procedure, it is common to have: °· Pain and soreness in the area where the biopsy was done. °· Bruising around the area where the biopsy was done. °· Sleepiness and fatigue for 1-2 days. °Follow these instructions at home: °Medicines °· Take over-the-counter and prescription medicines only as told by your health care provider. °· If you were prescribed an antibiotic medicine, take it as told by your health care provider. Do not stop taking the antibiotic even if you start to feel better. °· Do not take medicines such as aspirin and ibuprofen unless your health care provider tells you to take them. These medicines thin your blood and can increase the risk of bleeding. °· If you are taking prescription pain medicine, take actions to prevent or treat constipation. Your health care provider may recommend that you: °? Drink enough fluid to keep your urine pale yellow. °? Eat foods that are high in fiber, such as fresh fruits and vegetables, whole grains, and beans. °? Limit foods that are high in fat and processed sugars, such as fried or sweet foods. °? Take an over-the-counter or prescription medicine for constipation. °Incision care °· Follow instructions from your health care provider about how to take care of your incision. Make sure you: °? Wash your hands with soap and water before you change your bandage (dressing). If soap and water are not available, use hand sanitizer. °? Change your dressing as told by your health care provider. °? Leave stitches (sutures), skin glue, or adhesive strips in place. These skin closures may need to stay in place for 2 weeks or longer. If adhesive strip edges start to  loosen and curl up, you may trim the loose edges. Do not remove adhesive strips completely unless your health care provider tells you to do that. °· Check your incision area every day for signs of infection. Check for: °? Redness, swelling, or pain. °? Fluid or blood. °? Warmth. °? Pus or a bad smell. °· Do not take baths, swim, or use a hot tub until your health care provider says it is okay to do so. °Activity ° °· Rest at home for 1-2 days, or as directed by your health care provider. °? Avoid sitting for a long time without moving. Get up to take short walks every 1-2 hours. This is important to improve blood flow and breathing. Ask for help if you feel weak or unsteady. °· Return to your normal activities as told by your health care provider. Ask your health care provider what activities are safe for you. °· Do not drive or use heavy machinery while taking prescription pain medicine. °· Do not lift anything that is heavier than 10 lb (4.5 kg), or the limit that your health care provider tells you, until he or she says that it is safe. °· Do not play contact sports for 2 weeks after the procedure. °General instructions ° °· Do not drink alcohol in the first week after the procedure. °· Have someone stay with you for at least 24 hours after the procedure. °· It is your responsibility to obtain your test results. Ask your health care provider, or the department that is doing the test: °? When will my   results be ready? °? How will I get my results? °? What are my treatment options? °? What other tests do I need? °? What are my next steps? °· Keep all follow-up visits as told by your health care provider. This is important. °Contact a health care provider if: °· You have increased bleeding from an incision, resulting in more than a small spot of blood. °· You have redness, swelling, or increasing pain in any incisions. °· You notice a discharge or a bad smell coming from any of your incisions. °· You have a fever or  chills. °Get help right away if: °· You develop swelling, bloating, or pain in your abdomen. °· You become dizzy or faint. °· You develop a rash. °· You have nausea or you vomit. °· You faint, or you have shortness of breath or difficulty breathing. °· You develop chest pain. °· You have problems with your speech or vision. °· You have trouble with your balance or moving your arms or legs. °Summary °· After the liver biopsy, it is common to have pain, soreness, and bruising in the area, as well as sleepiness and fatigue. °· Take over-the-counter and prescription medicines only as told by your health care provider. °· Follow instructions from your health care provider about how to care for your incision. Check the incision area daily for signs of infection. °This information is not intended to replace advice given to you by your health care provider. Make sure you discuss any questions you have with your health care provider. °Document Released: 01/03/2005 Document Revised: 06/26/2017 Document Reviewed: 06/26/2017 °Elsevier Interactive Patient Education © 2019 [employer] °Moderate Conscious Sedation, Adult, Care After °These instructions provide you with information about caring for yourself after your procedure. Your health care provider may also give you more specific instructions. Your treatment has been planned according to current medical practices, but problems sometimes occur. Call your health care provider if you have any problems or questions after your procedure. °What can I expect after the procedure? °After your procedure, it is common: °· To feel sleepy for several hours. °· To feel clumsy and have poor balance for several hours. °· To have poor judgment for several hours. °· To vomit if you eat too soon. °Follow these instructions at home: °For at least 24 hours after the procedure: ° °· Do not: °? Participate in activities where you could fall or become injured. °? Drive. °? Use heavy  machinery. °? Drink alcohol. °? Take sleeping pills or medicines that cause drowsiness. °? Make important decisions or sign legal documents. °? Take care of children on your own. °· Rest. °Eating and drinking °· Follow the diet recommended by your health care provider. °· If you vomit: °? Drink water, juice, or soup when you can drink without vomiting. °? Make sure you have little or no nausea before eating solid foods. °General instructions °· Have a responsible adult stay with you until you are awake and alert. °· Take over-the-counter and prescription medicines only as told by your health care provider. °· If you smoke, do not smoke without supervision. °· Keep all follow-up visits as told by your health care provider. This is important. °Contact a health care provider if: °· You keep feeling nauseous or you keep vomiting. °· You feel light-headed. °· You develop a rash. °· You have a fever. °Get help right away if: °· You have trouble breathing. °This information is not intended to replace advice given to you by your   health care provider. Make sure you discuss any questions you have with your health care provider. °Document Released: 04/06/2013 Document Revised: 11/19/2015 Document Reviewed: 10/06/2015 °Elsevier Interactive Patient Education © 2019 [employer] ° °

## 2018-12-06 NOTE — H&P (Signed)
Chief Complaint: Liver mass   Referring Physician(s): Yu,Zhou  Supervising Physician: Corrie Mckusick  Patient Status: Abigail Ayers - In-pt  History of Present Illness: Abigail Ayers is a 73 y.o. female presenting today for evaluation for possible liver mass biopsy.   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  The imaging characteristics are non-diagnostic of HCC, even with elevated AFP.    Her case was reviewed recently at the [facility] tumor board with multiple specialties present.    A biopsy is reasonable.   She denies any fevers,rigors, chills.    Past Medical History:  Diagnosis Date  . Hypertension    not currently taking meds    Past Surgical History:  Procedure Laterality Date  . BREAST BIOPSY Left    neg  . CHOLECYSTECTOMY  1992    Allergies: Lisinopril  Medications: Prior to Admission medications   Medication Sig Start Date End Date Taking? Authorizing Provider  Ascorbic Acid (VITAMIN C) 1000 MG tablet Take by mouth.   Yes [provider]  azelastine (ASTELIN) 0.1 % nasal spray PLACE 1 SPRAY INTO BOTH NOSTRILS 2 (TWO) TIMES DAILY 02/19/18  Yes [provider]  Cholecalciferol (VITAMIN D) 1000 UNITS capsule Take 1,000 Units by mouth.   Yes [provider]  COD LIVER OIL PO Take by mouth.   Yes [provider]  Flax OIL Take by mouth.   Yes [provider]  magnesium 30 MG tablet Take 30 mg by mouth.   Yes [provider]  Multiple Vitamin (MULTIVITAMIN) tablet Take 1 tablet by mouth.    Yes [provider]  pantoprazole (PROTONIX) 20 MG tablet Take 20 mg by mouth daily. 11/03/18  Yes [provider]  potassium gluconate (RA POTASSIUM GLUCONATE) 595 (99 K) MG TABS tablet Take by mouth.   Yes [provider]  vitamin B-12 (CYANOCOBALAMIN) 100 MCG tablet  Take 50 mcg by mouth daily.   Yes [provider]  VITAMIN E PO Take 1 tablet by mouth.    Yes [provider]  traMADol (ULTRAM) 50 MG tablet Take 1 tablet (50 mg total) by mouth every 8 (eight) hours as needed. Patient not taking: Reported on 12/06/2018 11/25/18   Earlie Server, MD     Family History  Problem Relation Age of Onset  . Alzheimer's disease Mother   . Stroke Father   . Breast cancer Daughter 64       [place], genetic test negative    Social History   Socioeconomic History  . Marital status: Widowed    Spouse name: Not on file  . Number of children: Not on file  . Years of education: Not on file  . Highest education level: Not on file  Occupational History  . Not on file  Social Needs  . Financial resource strain: Not on file  . Food insecurity:    Worry: Not on file    Inability: Not on file  . Transportation needs:    Medical: Not on file    Non-medical: Not on file  Tobacco Use  . Smoking status: Never Smoker  . Smokeless tobacco: Never Used  Substance and Sexual Activity  . Alcohol use: No  . Drug use: No  . Sexual activity: Not on file  Lifestyle  . Physical activity:    Days per week: Not on file    Minutes  per session: Not on file  . Stress: Not on file  Relationships  . Social connections:    Talks on phone: Not on file    Gets together: Not on file    Attends religious service: Not on file    Active member of club or organization: Not on file    Attends meetings of clubs or organizations: Not on file    Relationship status: Not on file  Other Topics Concern  . Not on file  Social History Narrative  . Not on file      Review of Systems: A 12 point ROS discussed and pertinent positives are indicated in the HPI above.  All other systems are negative.  Review of Systems  Vital Signs: BP 134/76 (BP Location: Left Arm)   Pulse 86   Temp 97.7 F (36.5 C) (Oral)   Resp 17   Ht 5\' 4"  (1.626 m)   Wt 83.5 kg   SpO2 97%   BMI  31.58 kg/m   Physical Exam General: 73 yo female appearing   stated age.  Well-developed, well-nourished.  No distress. HEENT: Atraumatic, normocephalic.  Conjugate gaze, extra-ocular motor intact. No scleral icterus or scleral injection. No lesions on external ears, nose, lips, or gums.  Oral mucosa moist, pink.  Neck: Symmetric with no goiter enlargement.  Chest/Lungs:  Symmetric chest with inspiration/expiration.  No labored breathing.  Clear to auscultation with no wheezes, rhonchi, or rales.  Heart:  RRR, with no third heart sounds appreciated. No JVD appreciated.  Abdomen:  Soft, NT/ND, with + bowel sounds.   Genito-urinary: Deferred Neurologic: Alert & Oriented to person, place, and time.   Normal affect and insight.  Appropriate questions.  Moving all 4 extremities with gross sensory intact.     Imaging: Mr Abdomen Mrcp Moise Boring Contast  Addendum Date: 11/26/2018   ADDENDUM REPORT: 11/26/2018 20:44 ADDENDUM: Upon further review, the 2.4 cm mass described in segment 8 also appears to represent enhancing tumor thrombus, as demonstrated elsewhere throughout the right, left, and main portal veins. The hepatic neoplasm is much larger and very infiltrative in appearance, best measured on opposed-phase gradient echo sequence #4/image #10, at 8.0 x 11.5 cm. This involves segments 7, 8 and 4A/B, and is highly suspicious for an infiltrative cholangiocarcinoma or possibly hepatocellular carcinoma. This exam was also reviewed by Drs. Barry Dienes and Jobe Igo. Electronically Signed   By: Earle Gell M.D.   On: 11/26/2018 20:44   Result Date: 11/26/2018 CLINICAL DATA:  Abdominal pain. Portal vein thrombosis seen on recent CT. Evaluate for neoplasm. EXAM: MRI ABDOMEN WITHOUT AND WITH CONTRAST (INCLUDING MRCP) TECHNIQUE: Multiplanar multisequence MR imaging of the abdomen was performed both before and after the administration of intravenous contrast. Heavily T2-weighted images of the biliary and pancreatic ducts  were obtained, and three-dimensional MRCP images were rendered by post processing. CONTRAST:  9 mL Gadavist COMPARISON:  CT on 11/05/2018 FINDINGS: Lower chest: No acute findings. Hepatobiliary: Diffuse portal vein thrombosis is seen. The thrombus appears to show some contrast enhancement on subtraction imaging, consistent with tumor thrombus. A heterogeneously enhancing hypovascular mass is seen in segment 8 on image 21/18, which measures 2.4 x 2.3 cm. This is consistent with hepatic neoplasm. No other definite hepatic masses are identified. No gross morphologic features of cirrhosis are seen. Prior cholecystectomy noted. No evidence of biliary ductal dilatation. Pancreas: No mass or inflammatory changes. No evidence of pancreatic ductal dilatation. Pancreas divisum noted. Spleen:  Within normal limits in size and appearance.  Adrenals/Urinary Tract: No masses identified. No evidence of hydronephrosis. Stomach/Bowel: Visualized portion unremarkable. Vascular/Lymphatic: No pathologically enlarged lymph nodes identified. No abdominal aortic aneurysm. Other:  None. Musculoskeletal:  No suspicious bone lesions identified. IMPRESSION: 2.4 cm hypovascular mass in segment 8 of the right lobe. Differential diagnosis includes hepatocellular carcinoma, cholangiocarcinoma, and metastatic disease. 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 No evidence of lymphadenopathy or other abdominal metastatic disease. Electronically Signed: By: Earle Gell M.D. On: 11/11/2018 16:06    Labs:  CBC: Recent Labs    11/25/18 1521 12/06/18 0819  WBC 21.3* 12.0*  HGB 11.3* 10.4*  HCT 36.3 33.8*  PLT 429* 552*    COAGS: Recent Labs    12/06/18 0819  INR 1.1    BMP: Recent Labs    11/25/18 1521  NA 137  K 3.3*  CL 100  CO2 26  GLUCOSE 112*  BUN 6*  CALCIUM 9.4  CREATININE 0.61  GFRNONAA >60  GFRAA >60    LIVER FUNCTION TESTS: Recent Labs    11/25/18  1521  BILITOT 1.3*  AST 87*  ALT 39  ALKPHOS 212*  PROT 8.6*  ALBUMIN 3.3*    TUMOR MARKERS: No results for input(s): AFPTM, CEA, CA199, CHROMGRNA in the last 8760 hours.  Assessment and Plan:  Ms Kuwahara presents for US guided liver mass biopsy.  Concern for [place].   Risks and benefits of US guided liver mass biopsy was discussed with the patient and/or patient's family including, but not limited to bleeding, infection, damage to adjacent structures or low yield requiring additional tests.  All of the questions were answered and there is agreement to proceed.  Consent signed and in chart.  Thank you for this interesting consult.  I greatly enjoyed meeting TABOR BARTRAM and look forward to participating in their care.  A copy of this report was sent to the requesting provider on this date.  Electronically Signed: Corrie Mckusick, DO 12/06/2018, 9:56 AM   I spent a total of  40 Minutes   in face to face in clinical consultation, greater than 50% of which was counseling/coordinating care for US guided liver mass biopsy.

## 2018-12-08 ENCOUNTER — Other Ambulatory Visit: Payer: Self-pay | Admitting: Specialist

## 2018-12-08 ENCOUNTER — Other Ambulatory Visit: Payer: Self-pay | Admitting: Oncology

## 2018-12-08 DIAGNOSIS — K769 Liver disease, unspecified: Secondary | ICD-10-CM

## 2018-12-08 LAB — SURGICAL PATHOLOGY

## 2018-12-09 ENCOUNTER — Telehealth: Payer: Self-pay

## 2018-12-09 NOTE — Telephone Encounter (Signed)
Chest CT scheduled for 12-14-18 at 1630, with arrival time of 1615, at the [facility]. Attempted to call with appointment details. No answer/no voicemail. Will attempt again later.

## 2018-12-10 ENCOUNTER — Telehealth: Payer: Self-pay

## 2018-12-10 ENCOUNTER — Other Ambulatory Visit: Payer: Self-pay

## 2018-12-10 NOTE — Telephone Encounter (Signed)
Called placed to Abigail Ayers to provide CT chest appointment details. No answer/no voicemail. She does have upcoming appointment with Dr. Tasia Catchings and these details can be provided at that visit.

## 2018-12-13 ENCOUNTER — Inpatient Hospital Stay: Payer: Medicare Other | Attending: Oncology | Admitting: Oncology

## 2018-12-13 ENCOUNTER — Other Ambulatory Visit: Payer: Self-pay

## 2018-12-13 ENCOUNTER — Encounter: Payer: Self-pay | Admitting: Oncology

## 2018-12-13 VITALS — BP 149/92 | HR 122 | Temp 97.6°F | Wt 174.8 lb

## 2018-12-13 DIAGNOSIS — Z888 Allergy status to other drugs, medicaments and biological substances status: Secondary | ICD-10-CM | POA: Insufficient documentation

## 2018-12-13 DIAGNOSIS — I2510 Atherosclerotic heart disease of native coronary artery without angina pectoris: Secondary | ICD-10-CM | POA: Insufficient documentation

## 2018-12-13 DIAGNOSIS — R188 Other ascites: Secondary | ICD-10-CM | POA: Diagnosis not present

## 2018-12-13 DIAGNOSIS — R1084 Generalized abdominal pain: Secondary | ICD-10-CM | POA: Insufficient documentation

## 2018-12-13 DIAGNOSIS — C220 Liver cell carcinoma: Secondary | ICD-10-CM | POA: Diagnosis not present

## 2018-12-13 DIAGNOSIS — R809 Proteinuria, unspecified: Secondary | ICD-10-CM | POA: Insufficient documentation

## 2018-12-13 DIAGNOSIS — R634 Abnormal weight loss: Secondary | ICD-10-CM | POA: Insufficient documentation

## 2018-12-13 DIAGNOSIS — I700 Atherosclerosis of aorta: Secondary | ICD-10-CM | POA: Diagnosis not present

## 2018-12-13 DIAGNOSIS — Z79899 Other long term (current) drug therapy: Secondary | ICD-10-CM

## 2018-12-13 DIAGNOSIS — R911 Solitary pulmonary nodule: Secondary | ICD-10-CM | POA: Insufficient documentation

## 2018-12-13 DIAGNOSIS — Z823 Family history of stroke: Secondary | ICD-10-CM | POA: Insufficient documentation

## 2018-12-13 DIAGNOSIS — Z5112 Encounter for antineoplastic immunotherapy: Secondary | ICD-10-CM | POA: Insufficient documentation

## 2018-12-13 DIAGNOSIS — C9200 Acute myeloblastic leukemia, not having achieved remission: Secondary | ICD-10-CM

## 2018-12-13 DIAGNOSIS — I81 Portal vein thrombosis: Secondary | ICD-10-CM | POA: Insufficient documentation

## 2018-12-13 DIAGNOSIS — Z803 Family history of malignant neoplasm of breast: Secondary | ICD-10-CM | POA: Insufficient documentation

## 2018-12-13 DIAGNOSIS — Z820 Family history of epilepsy and other diseases of the nervous system: Secondary | ICD-10-CM | POA: Insufficient documentation

## 2018-12-13 DIAGNOSIS — D509 Iron deficiency anemia, unspecified: Secondary | ICD-10-CM | POA: Insufficient documentation

## 2018-12-13 DIAGNOSIS — E876 Hypokalemia: Secondary | ICD-10-CM | POA: Insufficient documentation

## 2018-12-13 DIAGNOSIS — K769 Liver disease, unspecified: Secondary | ICD-10-CM | POA: Diagnosis present

## 2018-12-13 DIAGNOSIS — Z7189 Other specified counseling: Secondary | ICD-10-CM

## 2018-12-13 MED ORDER — LENVATINIB (12 MG DAILY DOSE) 3 X 4 MG PO CPPK: 12.0000 mg | ORAL_CAPSULE | Freq: Every day | ORAL | 3 refills | Status: DC

## 2018-12-13 NOTE — Progress Notes (Addendum)
Hematology/Oncology Follow Up Note [facility]  Telephone:([phone] Fax:([phone]  Patient Care Team: Sharyne Peach, MD as PCP - General (Family Medicine) Bary Castilla, Forest Gleason, MD (General Surgery) Sharyne Peach, MD (Family Medicine) Clent Jacks, RN as Registered Nurse   Name of the patient: Abigail Ayers  [id number]  [birth year]   REASON FOR VISIT Follow up for discussion of pathology results and management of newly diagnosed [place] SHIRLEYMAE Ayers is a 73 y.o.afemale who has above oncology history reviewed by me today presented for follow up visit for management of [facility] Patient has a history of hepatitis C, status post Harvoni treatment finished in 2016. She has developed generalized abdominal pain for the past 3 months, was seen by gastroenterology Dr. Alice Reichert in May 2020.  Stat right upper quadrant ultrasound showed questionable portal vein thrombus of May in the left portal vein.  Patient subsequently had CT abdomen with contrast with liver protocol for further evaluation. I independently reviewed patient's images listed below. 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  No definitive stigmata of cirrhosis/portal venous hypertension or discrete hepatic lesion.   INTERVAL HISTORY 73 year old female with past medical history including hepatitis C, hypertension presents for follow-up of biopsy results and discussion of management of newly diagnosed [place]. During the interval patient underwent ultrasound-guided liver biopsy on 12/06/2018. 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  Patient continues to have generalized abdominal pain.  Patient was provided tramadol 50 mg every 8 hours as needed for pain.  She is not taking it.  She feels the pain depends on what she eats. Appetite is poor, she  has lost weight Review of Systems  Constitutional: Positive for appetite change, fatigue and unexpected weight change. Negative for chills and fever.  HENT:   Negative for hearing loss and voice change.   Eyes: Negative for eye problems.  Respiratory: Negative for chest tightness and cough.   Cardiovascular: Negative for chest pain.  Gastrointestinal: Positive for abdominal pain. Negative for abdominal distention and blood in stool.  Endocrine: Negative for hot flashes.  Genitourinary: Negative for difficulty urinating and frequency.   Musculoskeletal: Negative for arthralgias.  Skin: Negative for itching and rash.  Neurological: Negative for extremity weakness.  Hematological: Negative for adenopathy.  Psychiatric/Behavioral: Negative for confusion.      Allergies  Allergen Reactions   Lisinopril Other (See Comments)    Other reaction(s): Cough     Past Medical History:  Diagnosis Date   Hypertension    not currently taking meds     Past Surgical History:  Procedure Laterality Date   BREAST BIOPSY Left    neg   CHOLECYSTECTOMY  1992    Social History   Socioeconomic History   Marital status: Widowed    Spouse name: Not on file   Number of children: Not on file   Years of education: Not on file   Highest education level: Not on file  Occupational History   Not on file  Social Needs   Financial resource strain: Not on file   Food insecurity    Worry: Not on file    Inability: Not on file   Transportation needs    Medical: Not on file    Non-medical: Not on file  Tobacco Use   Smoking status: Never Smoker   Smokeless tobacco: Never Used  Substance and Sexual Activity   Alcohol  use: No   Drug use: No   Sexual activity: Not on file  Lifestyle   Physical activity    Days per week: Not on file    Minutes per session: Not on file   Stress: Not on file  Relationships   Social connections    Talks on phone: Not on file    Gets  together: Not on file    Attends religious service: Not on file    Active member of club or organization: Not on file    Attends meetings of clubs or organizations: Not on file    Relationship status: Not on file   Intimate partner violence    Fear of current or ex partner: Not on file    Emotionally abused: Not on file    Physically abused: Not on file    Forced sexual activity: Not on file  Other Topics Concern   Not on file  Social History Narrative   Not on file    Family History  Problem Relation Age of Onset   Alzheimer's disease Mother    Stroke Father    Breast cancer Daughter 65       [place], genetic test negative     Current Outpatient Medications:    Ascorbic Acid (VITAMIN C) 1000 MG tablet, Take by mouth., Disp: , Rfl:    Cholecalciferol (VITAMIN D) 1000 UNITS capsule, Take 1,000 Units by mouth., Disp: , Rfl:    COD LIVER OIL PO, Take by mouth., Disp: , Rfl:    Flax OIL, Take by mouth., Disp: , Rfl:    magnesium 30 MG tablet, Take 30 mg by mouth., Disp: , Rfl:    Multiple Vitamin (MULTIVITAMIN) tablet, Take 1 tablet by mouth. , Disp: , Rfl:    potassium gluconate (RA POTASSIUM GLUCONATE) 595 (99 K) MG TABS tablet, Take by mouth., Disp: , Rfl:    vitamin B-12 (CYANOCOBALAMIN) 100 MCG tablet, Take 50 mcg by mouth daily., Disp: , Rfl:    VITAMIN E PO, Take 1 tablet by mouth. , Disp: , Rfl:    azelastine (ASTELIN) 0.1 % nasal spray, PLACE 1 SPRAY INTO BOTH NOSTRILS 2 (TWO) TIMES DAILY, Disp: , Rfl: 1   pantoprazole (PROTONIX) 20 MG tablet, Take 20 mg by mouth daily., Disp: , Rfl:    traMADol (ULTRAM) 50 MG tablet, Take 1 tablet (50 mg total) by mouth every 8 (eight) hours as needed. (Patient not taking: Reported on 12/06/2018), Disp: 30 tablet, Rfl: 0  Physical exam:  Vitals:   12/13/18 1027  BP: (!) 149/92  Pulse: (!) 122  Temp: 97.6 F (36.4 C)  TempSrc: Tympanic  Weight: 174 lb 12.8 oz (79.3 kg)   Physical Exam Constitutional:      General:  She is not in acute distress. HENT:     Head: Normocephalic and atraumatic.  Eyes:     General: No scleral icterus.    Pupils: Pupils are equal, round, and reactive to light.  Neck:     Musculoskeletal: Normal range of motion and neck supple.  Cardiovascular:     Rate and Rhythm: Normal rate and regular rhythm.     Heart sounds: Normal heart sounds.  Pulmonary:     Effort: Pulmonary effort is normal. No respiratory distress.     Breath sounds: No wheezing.  Abdominal:     General: Bowel sounds are normal. There is no distension.     Palpations: Abdomen is soft. There is no mass.  Tenderness: There is no abdominal tenderness.  Musculoskeletal: Normal range of motion.        General: No deformity.  Skin:    General: Skin is warm and dry.     Findings: No erythema or rash.  Neurological:     Mental Status: She is alert and oriented to person, place, and time.     Cranial Nerves: No cranial nerve deficit.     Coordination: Coordination normal.  Psychiatric:        Behavior: Behavior normal.        Thought Content: Thought content normal.     CMP Latest Ref Rng & Units 11/25/2018  Glucose 70 - 99 mg/dL 112(H)  BUN 8 - 23 mg/dL 6(L)  Creatinine 0.44 - 1.00 mg/dL 0.61  Sodium 135 - 145 mmol/L 137  Potassium 3.5 - 5.1 mmol/L 3.3(L)  Chloride 98 - 111 mmol/L 100  CO2 22 - 32 mmol/L 26  Calcium 8.9 - 10.3 mg/dL 9.4  Total Protein 6.5 - 8.1 g/dL 8.6(H)  Total Bilirubin 0.3 - 1.2 mg/dL 1.3(H)  Alkaline Phos 38 - 126 U/L 212(H)  AST 15 - 41 U/L 87(H)  ALT 0 - 44 U/L 39   CBC Latest Ref Rng & Units 12/06/2018  WBC 4.0 - 10.5 K/uL 12.0(H)  Hemoglobin 12.0 - 15.0 g/dL 10.4(L)  Hematocrit 36.0 - 46.0 % 33.8(L)  Platelets 150 - 400 K/uL 552(H)    RADIOGRAPHIC STUDIES: I have personally reviewed the radiological images as listed and agreed with the findings in the report.  11/05/2018 CT abdomen pelvis with contrast showed extensive portal vein thrombosis with associated cavernous  transformation.  No definitive stigmata of cirrhosis/portal venous hypertension or discrete hepatic lesion.  5/14/ 2020 MRI with/without/MRCP the 2.4 cm mass described in segment 8 also appears to represent enhancing tumor thrombus, as demonstrated elsewhere throughout the right, left, and main portal veins. The hepatic neoplasm is much larger and very infiltrative in appearance,best measured on opposed-phase gradient echo sequence #4/image #10, at 8.0 x 11.5 cm. This involves segments 7, 8 and 4A/B, and is highly suspicious for an infiltrative cholangiocarcinoma or possibly hepatocellular carcinoma.   US Biopsy (liver)  Result Date: 12/06/2018 INDICATION: 73 year old female with a history liver mass EXAM: ULTRASOUND-GUIDED BIOPSY LIVER MASS MEDICATIONS: None. ANESTHESIA/SEDATION: Moderate (conscious) sedation was employed during this procedure. A total of Versed 1.5 mg and Fentanyl 75 mcg was administered intravenously. Moderate Sedation Time: 25 minutes. The patient's level of consciousness and vital signs were monitored continuously by radiology nursing throughout the procedure under my direct supervision. FLUOROSCOPY TIME:  ULTRASOUND COMPLICATIONS: NONE PROCEDURE: Informed written consent was obtained from the patient after a thorough discussion of the procedural risks, benefits and alternatives. All questions were addressed. Maximal Sterile Barrier Technique was utilized including caps, mask, sterile gowns, sterile gloves, sterile drape, hand hygiene and skin antiseptic. A timeout was performed prior to the initiation of the procedure Ultrasound survey of the right liver lobe performed with images stored and sent to PACs. The right lower thorax/right upper abdomen was prepped with chlorhexidine in a sterile fashion, and a sterile drape was applied covering the operative field. A sterile gown and sterile gloves were used for the procedure. Local anesthesia was provided with 1% Lidocaine. Once the  patient is prepped and draped sterilely and the skin and subcutaneous tissues were generously infiltrated with 1% lidocaine, a small stab incision was made with an 11 blade scalpel. A 17 gauge introducer needle was advanced under ultrasound guidance  in an intercostal location into the right liver lobe, targeting ill-defined parenchyma of the liver tissue of the right liver. The stylet was removed, and multiple separate 18 gauge core biopsy were retrieved. Samples were placed into formalin for transportation to the lab. Gel-Foam slurry was then infused with a small amount of saline for assistance with hemostasis. The needle was removed, and a final ultrasound image was performed. The patient tolerated the procedure well and remained hemodynamically stable throughout. No complications were encountered and no significant blood loss was encounter. IMPRESSION: Status post ultrasound-guided biopsy of ill-defined mass of the right liver. Signed, Dulcy Fanny. Dellia Nims, RPVI [facility] Radiology Electronically Signed   By: Corrie Mckusick D.O.   On: 12/06/2018 10:17      Assessment and plan Patient is a 73 y.o. female  With history of hep C and HTN presents for discussion of management of newly diagnosed [place].   1. Hepatocellular carcinoma (HCC)   2 3 4 5  Goals of care, counseling/discussion  Microcytic anemia Portal vein thrombosis Hepatitis C  CT, MRI images were independently reviewed and discussed with patient. Her case has been discussed on the tumor board.  Biopsy was recommended Pathology reports were reviewed and discussed with patient. The diagnosis of locally advanced hepatocellular carcinoma and care plan were discussed with patient in detail.  NCCN guidelines were reviewed and shared with patient.   The goal of treatment which is to palliate disease, disease related symptoms, improve quality of life and hopefully prolong life was highlighted in our  discussion.   Recommend atezolizumab plus bevacizumab Q3 weeks regimen [Phase 3 [id number] study- combination of atezolizumab plus bevacizumab give 42% decrease in the risk of death, 41% reduced risk of disease progression or death, versus the sorafenib ORR 27% vs 12%]  Chemotherapy education was provided.  We had discussed the composition of chemotherapy regimen, length of chemo cycle, duration of treatment and the time to assess response to treatment.    I explained to the patient the risks and benefits of Bevacizumab including all but not limited to High blood pressure, bleeding, thrombosis, peripheral edema, fatigue,skin rash, allergic reaction, nausea, diarrhea, mouth sore, low blood counts, etc.    I discussed the mechanism of action and rationale of using Atezolizumab.  Discussed the potential side effects of immunotherapy including but not limited to diarrhea; skin rash; respiratory failure, neurotoxicity, elevated LFTs/endocrine abnormalities etc. Patient voices understanding and willing to proceed with treatment.    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  recommend EGD for screening of esophageal varice, bleeding risk. May consider anticoagulation to prevent thrombosis extension if low bleeding risk.   # Hepatitis C treated.  Child Pugh A  # Anemia, microcytic.  Check iron panel. Patient prefers to get labs done tomorrow.  Has CT chest scheduled for tomorrow.  # Hypokalemia, K is 3, recommend starting potassium chloride 10 mEq daily.  Prescription sent to pharmacy. #Poor appetite, recommend trial of Marinol 5 mg twice daily with meals.  Prescription sent to pharmacy. Nurse to notify patient about new Rx.   Supportive care measures are necessary for patient well-being and will be provided as necessary. We  spent sufficient time to discuss many aspect of care, questions were answered to patient's satisfaction.  Orders Placed This Encounter  Procedures   CBC with Differential/Platelet    Standing Status:  Future    Standing Expiration Date:   12/13/2019   Comprehensive metabolic panel    Standing Status:   Future    Standing Expiration Date:   12/13/2019   Iron and TIBC    Standing Status:   Future    Standing Expiration Date:   12/13/2019   Ferritin    Standing Status:   Future    Standing Expiration Date:   12/13/2019   Retic Panel    Standing Status:   Future    Standing Expiration Date:   12/13/2019     We spent sufficient time to discuss many aspect of care, questions were answered to patient's satisfaction. Total face to face encounter time for this patient visit was 40 min. >50% of the time was  spent in counseling and coordination of care.     Earlie Server, MD, PhD Hematology Oncology [facility] Pager- [phone] 12/13/2018

## 2018-12-13 NOTE — Progress Notes (Signed)
START OFF PATHWAY REGIMEN - Other   [id number]:Lenvatinib:   Once daily:     Lenvatinib   **Always confirm dose/schedule in your pharmacy ordering system**  Patient Characteristics: Intent of Therapy: Non-Curative / Palliative Intent, Discussed with Patient 

## 2018-12-13 NOTE — Addendum Note (Signed)
Addended by: Earlie Server on: 12/13/2018 10:47 PM   Modules accepted: Orders

## 2018-12-13 NOTE — Progress Notes (Signed)
Met with Ms. Dingus following Dr. Tasia Catchings. Introduced nurse navigator services and provided contact information for future needs. Provided CT chest appointment details and will receive copy in AVS.

## 2018-12-14 ENCOUNTER — Other Ambulatory Visit: Payer: Self-pay

## 2018-12-14 ENCOUNTER — Ambulatory Visit
Admission: RE | Admit: 2018-12-14 | Discharge: 2018-12-14 | Disposition: A | Discharge status: Home / Self Care | Payer: Medicare Other | Source: Ambulatory Visit | Attending: Oncology | Admitting: Oncology

## 2018-12-14 ENCOUNTER — Inpatient Hospital Stay: Payer: Medicare Other

## 2018-12-14 DIAGNOSIS — Z820 Family history of epilepsy and other diseases of the nervous system: Secondary | ICD-10-CM | POA: Diagnosis not present

## 2018-12-14 DIAGNOSIS — I2510 Atherosclerotic heart disease of native coronary artery without angina pectoris: Secondary | ICD-10-CM | POA: Diagnosis not present

## 2018-12-14 DIAGNOSIS — E876 Hypokalemia: Secondary | ICD-10-CM | POA: Diagnosis not present

## 2018-12-14 DIAGNOSIS — R911 Solitary pulmonary nodule: Secondary | ICD-10-CM | POA: Diagnosis not present

## 2018-12-14 DIAGNOSIS — R188 Other ascites: Secondary | ICD-10-CM | POA: Diagnosis not present

## 2018-12-14 DIAGNOSIS — Z823 Family history of stroke: Secondary | ICD-10-CM | POA: Diagnosis not present

## 2018-12-14 DIAGNOSIS — Z79899 Other long term (current) drug therapy: Secondary | ICD-10-CM | POA: Diagnosis not present

## 2018-12-14 DIAGNOSIS — R1084 Generalized abdominal pain: Secondary | ICD-10-CM | POA: Diagnosis not present

## 2018-12-14 DIAGNOSIS — C220 Liver cell carcinoma: Secondary | ICD-10-CM

## 2018-12-14 DIAGNOSIS — I81 Portal vein thrombosis: Secondary | ICD-10-CM | POA: Diagnosis not present

## 2018-12-14 DIAGNOSIS — Z5112 Encounter for antineoplastic immunotherapy: Secondary | ICD-10-CM | POA: Diagnosis present

## 2018-12-14 DIAGNOSIS — Z803 Family history of malignant neoplasm of breast: Secondary | ICD-10-CM | POA: Diagnosis not present

## 2018-12-14 DIAGNOSIS — K769 Liver disease, unspecified: Secondary | ICD-10-CM | POA: Insufficient documentation

## 2018-12-14 DIAGNOSIS — Z888 Allergy status to other drugs, medicaments and biological substances status: Secondary | ICD-10-CM | POA: Diagnosis not present

## 2018-12-14 DIAGNOSIS — D509 Iron deficiency anemia, unspecified: Secondary | ICD-10-CM | POA: Diagnosis not present

## 2018-12-14 DIAGNOSIS — R634 Abnormal weight loss: Secondary | ICD-10-CM | POA: Diagnosis not present

## 2018-12-14 DIAGNOSIS — I700 Atherosclerosis of aorta: Secondary | ICD-10-CM | POA: Diagnosis not present

## 2018-12-14 DIAGNOSIS — R809 Proteinuria, unspecified: Secondary | ICD-10-CM | POA: Diagnosis not present

## 2018-12-14 LAB — CBC WITH DIFFERENTIAL/PLATELET
Abs Immature Granulocytes: 0.07 10*3/uL (ref 0.00–0.07)
Basophils Absolute: 0.1 10*3/uL (ref 0.0–0.1)
Basophils Relative: 1 %
Eosinophils Absolute: 0.1 10*3/uL (ref 0.0–0.5)
Eosinophils Relative: 1 %
HCT: 34 % — ABNORMAL LOW (ref 36.0–46.0)
Hemoglobin: 10.5 g/dL — ABNORMAL LOW (ref 12.0–15.0)
Immature Granulocytes: 1 %
Lymphocytes Relative: 17 %
Lymphs Abs: 1.9 10*3/uL (ref 0.7–4.0)
MCH: 24 pg — ABNORMAL LOW (ref 26.0–34.0)
MCHC: 30.9 g/dL (ref 30.0–36.0)
MCV: 77.6 fL — ABNORMAL LOW (ref 80.0–100.0)
Monocytes Absolute: 1.1 10*3/uL — ABNORMAL HIGH (ref 0.1–1.0)
Monocytes Relative: 10 %
Neutro Abs: 7.7 10*3/uL (ref 1.7–7.7)
Neutrophils Relative %: 70 %
Platelets: 429 10*3/uL — ABNORMAL HIGH (ref 150–400)
RBC: 4.38 MIL/uL (ref 3.87–5.11)
RDW: 19.5 % — ABNORMAL HIGH (ref 11.5–15.5)
WBC: 11 10*3/uL — ABNORMAL HIGH (ref 4.0–10.5)
nRBC: 0 % (ref 0.0–0.2)

## 2018-12-14 LAB — COMPREHENSIVE METABOLIC PANEL
ALT: 42 U/L (ref 0–44)
AST: 102 U/L — ABNORMAL HIGH (ref 15–41)
Albumin: 3.4 g/dL — ABNORMAL LOW (ref 3.5–5.0)
Alkaline Phosphatase: 310 U/L — ABNORMAL HIGH (ref 38–126)
Anion gap: 12 (ref 5–15)
BUN: 7 mg/dL — ABNORMAL LOW (ref 8–23)
CO2: 25 mmol/L (ref 22–32)
Calcium: 9.2 mg/dL (ref 8.9–10.3)
Chloride: 102 mmol/L (ref 98–111)
Creatinine, Ser: 0.72 mg/dL (ref 0.44–1.00)
GFR calc Af Amer: >60 mL/min (ref >60)
GFR calc non Af Amer: >60 mL/min (ref >60)
Glucose, Bld: 122 mg/dL — ABNORMAL HIGH (ref 70–99)
Potassium: 3 mmol/L — ABNORMAL LOW (ref 3.5–5.1)
Sodium: 139 mmol/L (ref 135–145)
Total Bilirubin: 1.6 mg/dL — ABNORMAL HIGH (ref 0.3–1.2)
Total Protein: 8 g/dL (ref 6.5–8.1)

## 2018-12-14 LAB — RETIC PANEL
Immature Retic Fract: 16.5 % — ABNORMAL HIGH (ref 2.3–15.9)
RBC.: 4.38 MIL/uL (ref 3.87–5.11)
Retic Count, Absolute: 106 10*3/uL (ref 19.0–186.0)
Retic Ct Pct: 2.4 % (ref 0.4–3.1)
Reticulocyte Hemoglobin: 27.9 pg — ABNORMAL LOW (ref >27.9)

## 2018-12-14 LAB — FERRITIN: Ferritin: 751 ng/mL — ABNORMAL HIGH (ref 11–307)

## 2018-12-14 LAB — IRON AND TIBC
Iron: 25 ug/dL — ABNORMAL LOW (ref 28–170)
Saturation Ratios: 11 % (ref 10.4–31.8)
TIBC: 238 ug/dL — ABNORMAL LOW (ref 250–450)
UIBC: 213 ug/dL

## 2018-12-16 ENCOUNTER — Telehealth: Payer: Self-pay

## 2018-12-16 ENCOUNTER — Other Ambulatory Visit: Payer: Self-pay

## 2018-12-16 ENCOUNTER — Other Ambulatory Visit: Payer: Medicare Other

## 2018-12-16 DIAGNOSIS — C220 Liver cell carcinoma: Secondary | ICD-10-CM | POA: Insufficient documentation

## 2018-12-16 MED ORDER — PROCHLORPERAZINE MALEATE 10 MG PO TABS: 10.0000 mg | ORAL_TABLET | Freq: Four times a day (QID) | ORAL | 1 refills | Status: AC | PRN

## 2018-12-16 MED ORDER — ONDANSETRON HCL 8 MG PO TABS: 8.0000 mg | ORAL_TABLET | Freq: Two times a day (BID) | ORAL | 1 refills | Status: AC | PRN

## 2018-12-16 MED ORDER — DRONABINOL 5 MG PO CAPS: 5.0000 mg | ORAL_CAPSULE | Freq: Two times a day (BID) | ORAL | 0 refills | Status: AC

## 2018-12-16 MED ORDER — POTASSIUM CHLORIDE ER 10 MEQ PO TBCR: 10.0000 meq | EXTENDED_RELEASE_TABLET | Freq: Every day | ORAL | 0 refills | Status: AC

## 2018-12-16 NOTE — Telephone Encounter (Signed)
Patient's potassium level is 3. I called and notified her that Dr. Tasia Catchings has sent in potassium chloride to her pharmacy and went over instructions on how to take. Patient voiced understanding.

## 2018-12-16 NOTE — Patient Instructions (Signed)
Atezolizumab injection What is this medicine? ATEZOLIZUMAB (a te zoe LIZ ue mab) is a monoclonal antibody. It is used to treat bladder cancer (urothelial cancer), non-small cell lung cancer, small cell lung cancer, and breast cancer. This medicine may be used for other purposes; ask your health care provider or pharmacist if you have questions. COMMON BRAND NAME(S): Tecentriq What should I tell my health care provider before I take this medicine? They need to know if you have any of these conditions: -diabetes -immune system problems -infection -inflammatory bowel disease -liver disease -lung or breathing disease -lupus -nervous system problems like myasthenia gravis or Guillain-Barre syndrome -organ transplant -an unusual or allergic reaction to atezolizumab, other medicines, foods, dyes, or preservatives -pregnant or trying to get pregnant -breast-feeding How should I use this medicine? This medicine is for infusion into a vein. It is given by a health care professional in a hospital or clinic setting. A special MedGuide will be given to you before each treatment. Be sure to read this information carefully each time. Talk to your pediatrician regarding the use of this medicine in children. Special care may be needed. Overdosage: If you think you have taken too much of this medicine contact a poison control center or emergency room at once. NOTE: This medicine is only for you. Do not share this medicine with others. What if I miss a dose? It is important not to miss your dose. Call your doctor or health care professional if you are unable to keep an appointment. What may interact with this medicine? Interactions have not been studied. This list may not describe all possible interactions. Give your health care provider a list of all the medicines, herbs, non-prescription drugs, or dietary supplements you use. Also tell them if you smoke, drink alcohol, or use illegal drugs. Some items  may interact with your medicine. What should I watch for while using this medicine? Your condition will be monitored carefully while you are receiving this medicine. You may need blood work done while you are taking this medicine. Do not become pregnant while taking this medicine or for at least 5 months after stopping it. Women should inform their doctor if they wish to become pregnant or think they might be pregnant. There is a potential for serious side effects to an unborn child. Talk to your health care professional or pharmacist for more information. Do not breast-feed an infant while taking this medicine or for at least 5 months after the last dose. What side effects may I notice from receiving this medicine? Side effects that you should report to your doctor or health care professional as soon as possible: -allergic reactions like skin rash, itching or hives, swelling of the face, lips, or tongue -black, tarry stools -bloody or watery diarrhea -breathing problems -changes in vision -chest pain or chest tightness -chills -facial flushing -fever -headache -signs and symptoms of high blood sugar such as dizziness; dry mouth; dry skin; fruity breath; nausea; stomach pain; increased hunger or thirst; increased urination -signs and symptoms of liver injury like dark yellow or brown urine; general ill feeling or flu-like symptoms; light-colored stools; loss of appetite; nausea; right upper belly pain; unusually weak or tired; yellowing of the eyes or skin -stomach pain -trouble passing urine or change in the amount of urine Side effects that usually do not require medical attention (report to your doctor or health care professional if they continue or are bothersome): -cough -diarrhea -joint pain -muscle pain -muscle weakness -tiredness -weight loss   This list may not describe all possible side effects. Call your doctor for medical advice about side effects. You may report side effects  to FDA at 1-800-FDA-1088. Where should I keep my medicine? This drug is given in a hospital or clinic and will not be stored at home. NOTE: This sheet is a summary. It may not cover all possible information. If you have questions about this medicine, talk to your doctor, pharmacist, or health care provider.  2019 Elsevier/Gold Standard (2017-09-18 09:33:38) Bevacizumab injection What is this medicine? BEVACIZUMAB (be va SIZ yoo mab) is a monoclonal antibody. It is used to treat many types of cancer. This medicine may be used for other purposes; ask your health care provider or pharmacist if you have questions. COMMON BRAND NAME(S): Avastin, MVASI What should I tell my health care provider before I take this medicine? They need to know if you have any of these conditions: -diabetes -heart disease -high blood pressure -history of coughing up blood -prior anthracycline chemotherapy (e.g., doxorubicin, daunorubicin, epirubicin) -recent or ongoing radiation therapy -recent or planning to have surgery -stroke -an unusual or allergic reaction to bevacizumab, hamster proteins, mouse proteins, other medicines, foods, dyes, or preservatives -pregnant or trying to get pregnant -breast-feeding How should I use this medicine? This medicine is for infusion into a vein. It is given by a health care professional in a hospital or clinic setting. Talk to your pediatrician regarding the use of this medicine in children. Special care may be needed. Overdosage: If you think you have taken too much of this medicine contact a poison control center or emergency room at once. NOTE: This medicine is only for you. Do not share this medicine with others. What if I miss a dose? It is important not to miss your dose. Call your doctor or health care professional if you are unable to keep an appointment. What may interact with this medicine? Interactions are not expected. This list may not describe all possible  interactions. Give your health care provider a list of all the medicines, herbs, non-prescription drugs, or dietary supplements you use. Also tell them if you smoke, drink alcohol, or use illegal drugs. Some items may interact with your medicine. What should I watch for while using this medicine? Your condition will be monitored carefully while you are receiving this medicine. You will need important blood work and urine testing done while you are taking this medicine. This medicine may increase your risk to bruise or bleed. Call your doctor or health care professional if you notice any unusual bleeding. This medicine should be started at least 28 days following major surgery and the site of the surgery should be totally healed. Check with your doctor before scheduling dental work or surgery while you are receiving this treatment. Talk to your doctor if you have recently had surgery or if you have a wound that has not healed. Do not become pregnant while taking this medicine or for 6 months after stopping it. Women should inform their doctor if they wish to become pregnant or think they might be pregnant. There is a potential for serious side effects to an unborn child. Talk to your health care professional or pharmacist for more information. Do not breast-feed an infant while taking this medicine and for 6 months after the last dose. This medicine has caused ovarian failure in some women. This medicine may interfere with the ability to have a child. You should talk to your doctor or health care professional if  you are concerned about your fertility. What side effects may I notice from receiving this medicine? Side effects that you should report to your doctor or health care professional as soon as possible: -allergic reactions like skin rash, itching or hives, swelling of the face, lips, or tongue -chest pain or chest tightness -chills -coughing up blood -high fever -seizures -severe  constipation -signs and symptoms of bleeding such as bloody or black, tarry stools; red or dark-brown urine; spitting up blood or brown material that looks like coffee grounds; red spots on the skin; unusual bruising or bleeding from the eye, gums, or nose -signs and symptoms of a blood clot such as breathing problems; chest pain; severe, sudden headache; pain, swelling, warmth in the leg -signs and symptoms of a stroke like changes in vision; confusion; trouble speaking or understanding; severe headaches; sudden numbness or weakness of the face, arm or leg; trouble walking; dizziness; loss of balance or coordination -stomach pain -sweating -swelling of legs or ankles -vomiting -weight gain Side effects that usually do not require medical attention (report to your doctor or health care professional if they continue or are bothersome): -back pain -changes in taste -decreased appetite -dry skin -nausea -tiredness This list may not describe all possible side effects. Call your doctor for medical advice about side effects. You may report side effects to FDA at 1-800-FDA-1088. Where should I keep my medicine? This drug is given in a hospital or clinic and will not be stored at home. NOTE: This sheet is a summary. It may not cover all possible information. If you have questions about this medicine, talk to your doctor, pharmacist, or health care provider.  2019 Elsevier/Gold Standard (2016-06-13 14:33:29)

## 2018-12-16 NOTE — Progress Notes (Addendum)
Tumor Board Documentation  Abigail Ayers was presented by Dr Tasia Catchings at our Tumor Board on 12/16/2018, which included representatives from medical oncology, radiation oncology, pathology, radiology, surgical, surgical oncology, navigation, internal medicine, research.  Abigail Ayers currently presents as a current patient, for [facility], for new positive pathology with history of the following treatments: active survellience, surgical intervention(s).  Additionally, we reviewed previous medical and familial history, history of present illness, and recent lab results along with all available histopathologic and imaging studies. The tumor board considered available treatment options and made the following recommendations: Immunotherapy and target therapy Pt is Not a surgical candidate, Treat with Immunotherapy and possbility of Y 90 in future  The following procedures/referrals were also placed: No orders of the defined types were placed in this encounter.   Clinical Trial Status: not discussed   Staging used: AJCC Stage Group  AJCC Staging: T: 4     Group: [place] site-specific guidelines NCCN were discussed with respect to the case.  Tumor board is a meeting of clinicians from various specialty areas who evaluate and discuss patients for whom a multidisciplinary approach is being considered. Final determinations in the plan of care are those of the provider(s). The responsibility for follow up of recommendations given during tumor board is that of the provider.   Today's extended care, comprehensive team conference, Abigail Ayers was not present for the discussion and was not examined.   Multidisciplinary Tumor Board is a multidisciplinary case peer review process.  Decisions discussed in the Multidisciplinary Tumor Board reflect the opinions of the specialists present at the conference without having examined the patient.  Ultimately, treatment and diagnostic decisions rest with the  primary provider(s) and the patient.

## 2018-12-16 NOTE — Addendum Note (Signed)
Addended by: Earlie Server on: 12/16/2018 02:35 PM   Modules accepted: Orders

## 2018-12-16 NOTE — Progress Notes (Signed)
DISCONTINUE OFF PATHWAY REGIMEN - Other   [id number]:Lenvatinib:   Once daily:     Lenvatinib   **Always confirm dose/schedule in your pharmacy ordering system**  REASON: Other Reason PRIOR TREATMENT: Lenvatinib  START OFF PATHWAY REGIMEN - Other   [id number]:Atezolizumab 1,200 mg IV D1 + Bevacizumab 15 mg/kg IV D1 q21 Days:   A cycle is every 21 Days:     Atezolizumab      Bevacizumab-xxxx   **Always confirm dose/schedule in your pharmacy ordering system**  Patient Characteristics: Intent of Therapy: Non-Curative / Palliative Intent, Discussed with Patient

## 2018-12-16 NOTE — Telephone Encounter (Signed)
Telephone call to patient and she is in agreement to do chemotherapy class Virtual.  Chemotherapy education material e-mail to patient and informed her I will reach out to her on June 24th at 3:00 pm.

## 2018-12-20 ENCOUNTER — Other Ambulatory Visit: Payer: Medicare Other

## 2018-12-20 ENCOUNTER — Other Ambulatory Visit: Payer: Self-pay

## 2018-12-20 DIAGNOSIS — Z20822 Contact with and (suspected) exposure to covid-19: Secondary | ICD-10-CM

## 2018-12-22 ENCOUNTER — Inpatient Hospital Stay: Payer: Medicare Other

## 2018-12-22 ENCOUNTER — Other Ambulatory Visit: Payer: Self-pay

## 2018-12-23 ENCOUNTER — Inpatient Hospital Stay: Payer: Medicare Other

## 2018-12-23 ENCOUNTER — Other Ambulatory Visit: Payer: Self-pay | Admitting: *Deleted

## 2018-12-23 ENCOUNTER — Other Ambulatory Visit: Payer: Self-pay

## 2018-12-23 ENCOUNTER — Inpatient Hospital Stay (HOSPITAL_BASED_OUTPATIENT_CLINIC_OR_DEPARTMENT_OTHER): Payer: Medicare Other | Admitting: Hospice and Palliative Medicine

## 2018-12-23 ENCOUNTER — Encounter: Payer: Self-pay | Admitting: Oncology

## 2018-12-23 ENCOUNTER — Inpatient Hospital Stay (HOSPITAL_BASED_OUTPATIENT_CLINIC_OR_DEPARTMENT_OTHER): Payer: Medicare Other | Admitting: Oncology

## 2018-12-23 VITALS — BP 146/86 | HR 103

## 2018-12-23 VITALS — BP 127/85 | HR 106 | Temp 97.5°F | Resp 18 | Wt 170.3 lb

## 2018-12-23 DIAGNOSIS — E876 Hypokalemia: Secondary | ICD-10-CM | POA: Diagnosis not present

## 2018-12-23 DIAGNOSIS — C220 Liver cell carcinoma: Secondary | ICD-10-CM

## 2018-12-23 DIAGNOSIS — Z515 Encounter for palliative care: Secondary | ICD-10-CM | POA: Diagnosis not present

## 2018-12-23 DIAGNOSIS — R509 Fever, unspecified: Secondary | ICD-10-CM

## 2018-12-23 DIAGNOSIS — Z7189 Other specified counseling: Secondary | ICD-10-CM

## 2018-12-23 DIAGNOSIS — Z888 Allergy status to other drugs, medicaments and biological substances status: Secondary | ICD-10-CM

## 2018-12-23 DIAGNOSIS — I81 Portal vein thrombosis: Secondary | ICD-10-CM

## 2018-12-23 DIAGNOSIS — I10 Essential (primary) hypertension: Secondary | ICD-10-CM

## 2018-12-23 DIAGNOSIS — R188 Other ascites: Secondary | ICD-10-CM

## 2018-12-23 DIAGNOSIS — R1084 Generalized abdominal pain: Secondary | ICD-10-CM | POA: Diagnosis not present

## 2018-12-23 DIAGNOSIS — Z5112 Encounter for antineoplastic immunotherapy: Secondary | ICD-10-CM

## 2018-12-23 DIAGNOSIS — I2510 Atherosclerotic heart disease of native coronary artery without angina pectoris: Secondary | ICD-10-CM

## 2018-12-23 DIAGNOSIS — Z823 Family history of stroke: Secondary | ICD-10-CM

## 2018-12-23 DIAGNOSIS — G893 Neoplasm related pain (acute) (chronic): Secondary | ICD-10-CM | POA: Diagnosis not present

## 2018-12-23 DIAGNOSIS — I700 Atherosclerosis of aorta: Secondary | ICD-10-CM

## 2018-12-23 DIAGNOSIS — Z79899 Other long term (current) drug therapy: Secondary | ICD-10-CM

## 2018-12-23 DIAGNOSIS — Z803 Family history of malignant neoplasm of breast: Secondary | ICD-10-CM

## 2018-12-23 DIAGNOSIS — Z820 Family history of epilepsy and other diseases of the nervous system: Secondary | ICD-10-CM

## 2018-12-23 DIAGNOSIS — R809 Proteinuria, unspecified: Secondary | ICD-10-CM

## 2018-12-23 LAB — CBC WITH DIFFERENTIAL/PLATELET
Abs Immature Granulocytes: 0.06 10*3/uL (ref 0.00–0.07)
Basophils Absolute: 0.1 10*3/uL (ref 0.0–0.1)
Basophils Relative: 1 %
Eosinophils Absolute: 0.1 10*3/uL (ref 0.0–0.5)
Eosinophils Relative: 1 %
HCT: 33.4 % — ABNORMAL LOW (ref 36.0–46.0)
Hemoglobin: 10.3 g/dL — ABNORMAL LOW (ref 12.0–15.0)
Immature Granulocytes: 1 %
Lymphocytes Relative: 11 %
Lymphs Abs: 1.3 10*3/uL (ref 0.7–4.0)
MCH: 23.7 pg — ABNORMAL LOW (ref 26.0–34.0)
MCHC: 30.8 g/dL (ref 30.0–36.0)
MCV: 76.8 fL — ABNORMAL LOW (ref 80.0–100.0)
Monocytes Absolute: 1.3 10*3/uL — ABNORMAL HIGH (ref 0.1–1.0)
Monocytes Relative: 12 %
Neutro Abs: 8.3 10*3/uL — ABNORMAL HIGH (ref 1.7–7.7)
Neutrophils Relative %: 74 %
Platelets: 334 10*3/uL (ref 150–400)
RBC: 4.35 MIL/uL (ref 3.87–5.11)
RDW: 19.3 % — ABNORMAL HIGH (ref 11.5–15.5)
Smear Review: NORMAL
WBC: 11.1 10*3/uL — ABNORMAL HIGH (ref 4.0–10.5)
nRBC: 0 % (ref 0.0–0.2)

## 2018-12-23 LAB — RETIC PANEL
Immature Retic Fract: 22.7 % — ABNORMAL HIGH (ref 2.3–15.9)
RBC.: 4.38 MIL/uL (ref 3.87–5.11)
Retic Count, Absolute: 85.4 10*3/uL (ref 19.0–186.0)
Retic Ct Pct: 2 % (ref 0.4–3.1)
Reticulocyte Hemoglobin: 27.7 pg — ABNORMAL LOW (ref >27.9)

## 2018-12-23 LAB — COMPREHENSIVE METABOLIC PANEL
ALT: 31 U/L (ref 0–44)
AST: 87 U/L — ABNORMAL HIGH (ref 15–41)
Albumin: 3.1 g/dL — ABNORMAL LOW (ref 3.5–5.0)
Alkaline Phosphatase: 236 U/L — ABNORMAL HIGH (ref 38–126)
Anion gap: 13 (ref 5–15)
BUN: 6 mg/dL — ABNORMAL LOW (ref 8–23)
CO2: 22 mmol/L (ref 22–32)
Calcium: 9.2 mg/dL (ref 8.9–10.3)
Chloride: 101 mmol/L (ref 98–111)
Creatinine, Ser: 0.7 mg/dL (ref 0.44–1.00)
GFR calc Af Amer: >60 mL/min (ref >60)
GFR calc non Af Amer: >60 mL/min (ref >60)
Glucose, Bld: 107 mg/dL — ABNORMAL HIGH (ref 70–99)
Potassium: 3 mmol/L — ABNORMAL LOW (ref 3.5–5.1)
Sodium: 136 mmol/L (ref 135–145)
Total Bilirubin: 2 mg/dL — ABNORMAL HIGH (ref 0.3–1.2)
Total Protein: 7.5 g/dL (ref 6.5–8.1)

## 2018-12-23 LAB — MAGNESIUM: Magnesium: 1.9 mg/dL (ref 1.7–2.4)

## 2018-12-23 LAB — TSH: TSH: 1.952 u[IU]/mL (ref 0.350–4.500)

## 2018-12-23 LAB — NOVEL CORONAVIRUS, NAA: SARS-CoV-2, NAA: NOT DETECTED

## 2018-12-23 LAB — PROTEIN, URINE, RANDOM: Total Protein, Urine: 225 mg/dL

## 2018-12-23 MED ORDER — SODIUM CHLORIDE 0.9 % IV SOLN
Freq: Once | INTRAVENOUS | Status: AC
  Administered 2018-12-23: 12:00:00 via INTRAVENOUS
  Filled 2018-12-23: qty 250

## 2018-12-23 MED ORDER — SODIUM CHLORIDE 0.9 % IV SOLN
Freq: Once | INTRAVENOUS | Status: AC
  Administered 2018-12-23: 10:00:00 via INTRAVENOUS
  Filled 2018-12-23: qty 250

## 2018-12-23 MED ORDER — SODIUM CHLORIDE 0.9 % IV SOLN
15.0000 mg/kg | Freq: Once | INTRAVENOUS | Status: AC
  Administered 2018-12-23: 13:00:00 1200 mg via INTRAVENOUS
  Filled 2018-12-23: qty 48

## 2018-12-23 MED ORDER — SODIUM CHLORIDE 0.9 % IV SOLN
20.0000 meq | Freq: Once | INTRAVENOUS | Status: AC
  Administered 2018-12-23: 10:00:00 20 meq via INTRAVENOUS
  Filled 2018-12-23: qty 10

## 2018-12-23 MED ORDER — SODIUM CHLORIDE 0.9 % IV SOLN
1200.0000 mg | Freq: Once | INTRAVENOUS | Status: AC
  Administered 2018-12-23: 13:00:00 1200 mg via INTRAVENOUS
  Filled 2018-12-23: qty 20

## 2018-12-23 NOTE — Progress Notes (Signed)
UP 225.  Ok to proceed with avastin today per md, recommend checking every time/see nephrology.

## 2018-12-23 NOTE — Progress Notes (Signed)
Per Dr. Tasia Catchings, okay to proceed with treatment with 12/23/2018 labs (Bilirubin 2.0) and HR 106. Per Dr. Tasia Catchings okay to proceed with urine protein 225. Per Dr. Tasia Catchings will monitor urine protein prior to each treatment pt aware and verbalizes understanding.  Pt tolerated infusion well, pt and VS stable at discharge.

## 2018-12-23 NOTE — Progress Notes (Signed)
[facility]  Telephone:([phone] [phone]   Name: Abigail Ayers Date: 12/23/2018 MRN: [id number]  DOB: 10/13/1945  Patient Care Team: Sharyne Peach, MD as PCP - General (Family Medicine) Bary Castilla, Forest Gleason, MD (General Surgery) Sharyne Peach, MD (Family Medicine) Clent Jacks, RN as Registered Nurse    REASON FOR CONSULTATION: Palliative Care consult requested for this 73 y.o. female with multiple medical problems including locally advanced [place] currently on treatment with atezolizumab plus bevacizumab.  PMH also notable for history of HCV status post Harvoni treatment, and portal vein thrombus.  Patient was referred to palliative care to help address symptoms and goals.   SOCIAL HISTORY:     reports that she has never smoked. She has never used smokeless tobacco. She reports that she does not drink alcohol or use drugs.   Patient lives at home alone.  Patient has a daughter who is involved in her care.  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  She is continued to work until recently.  ADVANCE DIRECTIVES:    CODE STATUS:   PAST MEDICAL HISTORY: Past Medical History:  Diagnosis Date   Hypertension    not currently taking meds    PAST SURGICAL HISTORY:  Past Surgical History:  Procedure Laterality Date   BREAST BIOPSY Left    neg   CHOLECYSTECTOMY  1992    HEMATOLOGY/ONCOLOGY HISTORY:  Oncology History  Hepatocellular carcinoma ([place])  12/16/2018 Initial Diagnosis   Hepatocellular carcinoma ([place])   12/23/2018 -  Chemotherapy   The patient had bevacizumab (AVASTIN) 1,200 mg in sodium chloride 0.9 % 100 mL chemo infusion, 15 mg/kg = 1,200 mg, Intravenous,  Once, 1 of 6 cycles atezolizumab (TECENTRIQ) 1,200 mg in sodium chloride 0.9 % 250 mL chemo infusion, 1,200 mg, Intravenous, Once, 1 of 6 cycles  for chemotherapy treatment.      ALLERGIES:  is  allergic to lisinopril.  MEDICATIONS:  Current Outpatient Medications  Medication Sig Dispense Refill   Ascorbic Acid (VITAMIN C) 1000 MG tablet Take by mouth.     azelastine (ASTELIN) 0.1 % nasal spray PLACE 1 SPRAY INTO BOTH NOSTRILS 2 (TWO) TIMES DAILY  1   Cholecalciferol (VITAMIN D) 1000 UNITS capsule Take 1,000 Units by mouth.     COD LIVER OIL PO Take by mouth.     dronabinol (MARINOL) 5 MG capsule Take 1 capsule (5 mg total) by mouth 2 (two) times daily before a meal. (Patient not taking: Reported on 12/23/2018) 30 capsule 0   Flax OIL Take by mouth.     Lenvatinib 12 mg daily dose (LENVIMA) 3 x 4 MG capsule Take 12 mg by mouth daily. 30 capsule 3   magnesium 30 MG tablet Take 30 mg by mouth.     Multiple Vitamin (MULTIVITAMIN) tablet Take 1 tablet by mouth.      ondansetron (ZOFRAN) 8 MG tablet Take 1 tablet (8 mg total) by mouth 2 (two) times daily as needed (Nausea or vomiting). 30 tablet 1   pantoprazole (PROTONIX) 20 MG tablet Take 20 mg by mouth daily.     potassium chloride (K-DUR) 10 MEQ tablet Take 1 tablet (10 mEq total) by mouth daily. 30 tablet 0   potassium gluconate (RA POTASSIUM GLUCONATE) 595 (99 K) MG TABS tablet Take by mouth.     prochlorperazine (COMPAZINE) 10 MG tablet Take 1 tablet (10 mg total) by mouth  every 6 (six) hours as needed (Nausea or vomiting). 30 tablet 1   traMADol (ULTRAM) 50 MG tablet Take 1 tablet (50 mg total) by mouth every 8 (eight) hours as needed. 30 tablet 0   vitamin B-12 (CYANOCOBALAMIN) 100 MCG tablet Take 50 mcg by mouth daily.     VITAMIN E PO Take 1 tablet by mouth.      No current facility-administered medications for this visit.     VITAL SIGNS: There were no vitals taken for this visit. There were no vitals filed for this visit.  Estimated body mass index is 29.23 kg/m as calculated from the following:   Height as of 12/06/18: '5\' 4"'  (1.626 m).   Weight as of an earlier encounter on 12/23/18: 170 lb 4.8 oz (77.2  kg).  LABS: CBC:    Component Value Date/Time   WBC 11.1 (H) 12/23/2018 0815   HGB 10.3 (L) 12/23/2018 0815   HCT 33.4 (L) 12/23/2018 0815   PLT 334 12/23/2018 0815   MCV 76.8 (L) 12/23/2018 0815   NEUTROABS 8.3 (H) 12/23/2018 0815   LYMPHSABS 1.3 12/23/2018 0815   MONOABS 1.3 (H) 12/23/2018 0815   EOSABS 0.1 12/23/2018 0815   BASOSABS 0.1 12/23/2018 0815   Comprehensive Metabolic Panel:    Component Value Date/Time   NA 136 12/23/2018 0815   K 3.0 (L) 12/23/2018 0815   CL 101 12/23/2018 0815   CO2 22 12/23/2018 0815   BUN 6 (L) 12/23/2018 0815   CREATININE 0.70 12/23/2018 0815   GLUCOSE 107 (H) 12/23/2018 0815   CALCIUM 9.2 12/23/2018 0815   AST 87 (H) 12/23/2018 0815   ALT 31 12/23/2018 0815   ALKPHOS 236 (H) 12/23/2018 0815   BILITOT 2.0 (H) 12/23/2018 0815   PROT 7.5 12/23/2018 0815   ALBUMIN 3.1 (L) 12/23/2018 0815    RADIOGRAPHIC STUDIES: Ct Chest Wo Contrast  Result Date: 12/15/2018 CLINICAL DATA:  Hepatocellular carcinoma staging. Chest pain. Portal vein thrombosis. EXAM: CT CHEST WITHOUT CONTRAST TECHNIQUE: Multidetector CT imaging of the chest was performed following the standard protocol without IV contrast. COMPARISON:  Abdominal CT 11/05/2018.  No prior chest imaging. FINDINGS: Cardiovascular: Aortic atherosclerosis. Tortuous thoracic aorta. Borderline cardiomegaly. Multivessel coronary artery atherosclerosis. Mediastinum/Nodes: No supraclavicular adenopathy. No mediastinal or definite hilar adenopathy, given limitations of unenhanced CT. Lungs/Pleura: Trace right pleural fluid. No suspicious pulmonary nodule or mass. Areas of bilateral major fissure thickening versus small subpleural lymph nodes. A right apical subpleural 3 mm nodule on image 29/3 is also most likely a subpleural lymph node. Upper Abdomen: Cirrhosis with heterogeneity throughout the liver, better evaluated on contrast-enhanced studies. New small volume ascites since 11/05/2018. Musculoskeletal:  Thoracic spondylosis. IMPRESSION: 1.  No acute process or evidence of metastatic disease in the chest. 2. Coronary artery atherosclerosis. Aortic Atherosclerosis (ICD10-I70.0). 3. Trace right pleural fluid. 4. New small volume ascites. Electronically Signed   By: Abigail Miyamoto M.D.   On: 12/15/2018 08:51   US Biopsy (liver)  Result Date: 12/06/2018 INDICATION: 73 year old female with a history liver mass EXAM: ULTRASOUND-GUIDED BIOPSY LIVER MASS MEDICATIONS: None. ANESTHESIA/SEDATION: Moderate (conscious) sedation was employed during this procedure. A total of Versed 1.5 mg and Fentanyl 75 mcg was administered intravenously. Moderate Sedation Time: 25 minutes. The patient's level of consciousness and vital signs were monitored continuously by radiology nursing throughout the procedure under my direct supervision. FLUOROSCOPY TIME:  ULTRASOUND COMPLICATIONS: NONE PROCEDURE: Informed written consent was obtained from the patient after a thorough discussion of the procedural risks, benefits  and alternatives. All questions were addressed. Maximal Sterile Barrier Technique was utilized including caps, mask, sterile gowns, sterile gloves, sterile drape, hand hygiene and skin antiseptic. A timeout was performed prior to the initiation of the procedure Ultrasound survey of the right liver lobe performed with images stored and sent to PACs. The right lower thorax/right upper abdomen was prepped with chlorhexidine in a sterile fashion, and a sterile drape was applied covering the operative field. A sterile gown and sterile gloves were used for the procedure. Local anesthesia was provided with 1% Lidocaine. Once the patient is prepped and draped sterilely and the skin and subcutaneous tissues were generously infiltrated with 1% lidocaine, a small stab incision was made with an 11 blade scalpel. A 17 gauge introducer needle was advanced under ultrasound guidance in an intercostal location into the right liver lobe, targeting  ill-defined parenchyma of the liver tissue of the right liver. The stylet was removed, and multiple separate 18 gauge core biopsy were retrieved. Samples were placed into formalin for transportation to the lab. Gel-Foam slurry was then infused with a small amount of saline for assistance with hemostasis. The needle was removed, and a final ultrasound image was performed. The patient tolerated the procedure well and remained hemodynamically stable throughout. No complications were encountered and no significant blood loss was encounter. IMPRESSION: Status post ultrasound-guided biopsy of ill-defined mass of the right liver. Signed, Dulcy Fanny. Dellia Nims, RPVI [facility] Electronically Signed   By: Corrie Mckusick D.O.   On: 12/06/2018 10:17    PERFORMANCE STATUS (ECOG) : 1 - Symptomatic but completely ambulatory  Review of Systems Unless otherwise noted, a complete review of systems is negative.  Physical Exam General: NAD, frail appearing, thin Cardiovascular: regular rate and rhythm Pulmonary: clear ant fields, unlabored Abdomen: soft, nontender, + bowel sounds GU: no suprapubic tenderness Extremities: no edema, no joint deformities Skin: no rashes Neurological: Weakness but otherwise nonfocal  IMPRESSION: I met with patient today in the infusion area.  Introduced palliative care services and attempted to establish therapeutic rapport.  Patient reports that she is doing reasonably well.  She has occasional pain that is associated with dietary intake.  She is noted some foods seem to exacerbate the pain such as eating raw greens and lettuce.  Abd pain seems to occur concurrently with loose stools and resolves after she has a bowel movement.  Patient takes tramadol as needed.  Could consider trial of dicyclomine.  Oral intake is reportedly poor.  I note the patient' weight is down to 170 pounds from weight of 184 pounds 1 month ago.  Will  refer to dietitian.  Patient says that she is fiercely independent.  Her goal is to maintain her independence for as long as possible.  We will need to have more in-depth conversations with her regarding her goals and advance care planning as we go forward.  Case and plan discussed with Dr. Tasia Catchings.  PLAN: -Continue current scope of treatment -Agree with tramadol as needed for pain -Could consider trial of dicyclomine as a GI antispasmodic -Recommend oral supplements -Referral to dietitian -RTC in 2 weeks   Patient expressed understanding and was in agreement with this plan. She also understands that She can call the clinic at any time with any questions, concerns, or complaints.     Time Total: 20 minutes  Visit consisted of counseling and education dealing with the complex and emotionally intense issues of symptom management and palliative care in  the setting of serious and potentially life-threatening illness.Greater than 50%  of this time was spent counseling and coordinating care related to the above assessment and plan.  Signed by: Altha Harm, PhD, NP-C [phone] (Work Cell)

## 2018-12-23 NOTE — Progress Notes (Signed)
Patient here for initial treatment of tecentriq & avastin. States appetite is poor. She took Marinol one time but it made her drowsy so she stopped taking it.

## 2018-12-24 ENCOUNTER — Telehealth: Payer: Self-pay | Admitting: *Deleted

## 2018-12-24 LAB — T4: T4, Total: 13.9 ug/dL — ABNORMAL HIGH (ref 4.5–12.0)

## 2018-12-24 MED ORDER — LOPERAMIDE HCL 2 MG PO CAPS: 2.0000 mg | ORAL_CAPSULE | ORAL | 1 refills | Status: AC

## 2018-12-24 NOTE — Telephone Encounter (Signed)
Patient informed of prescription sent, she states she is feeling a little better with  The antiemetics

## 2018-12-24 NOTE — Telephone Encounter (Signed)
I will send imodium. Please let her know. Thanks.

## 2018-12-24 NOTE — Telephone Encounter (Signed)
Patient called reporting that she did not sleep last night after receiving her infusion yesterday. Vomiting a lot (3 times) and watery diarrhea (>5 times this morning). She has not taken any medicine for the vomiting  Or diarrhea. I advised she take a Zofran now an dif in 2 hours she is not better to take the Compazine. She does not have anything for the diarrhea. Please advise

## 2018-12-25 NOTE — Progress Notes (Addendum)
Hematology/Oncology Follow Up Note [facility]  [phone] [phone]  Patient Care Team: Sharyne Peach, MD as PCP - General (Family Medicine) Bary Castilla, Forest Gleason, MD (General Surgery) Sharyne Peach, MD (Family Medicine) Clent Jacks, RN as Registered Nurse   Name of the patient: Abigail Ayers  [id number]  1945/07/25   REASON FOR VISIT Follow up for discussion of pathology results and management of newly diagnosed [place] SANTASIA REW is a 73 y.o.afemale who has above oncology history reviewed by me today presented for follow up visit for management of [facility] Patient has a history of hepatitis C, status post Harvoni treatment finished in 2016. She has developed generalized abdominal pain for the past 3 months, was seen by gastroenterology Dr. Alice Reichert in May 2020.  Stat right upper quadrant ultrasound showed questionable portal vein thrombus of May in the left portal vein.  Patient subsequently had CT abdomen with contrast with liver protocol for further evaluation. I independently reviewed patient's images listed below. 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  No definitive stigmata of cirrhosis/portal venous hypertension or discrete hepatic lesion.  # ultrasound-guided liver biopsy on 12/06/2018. 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  INTERVAL HISTORY 73 year old female with past medical history including hepatitis C, hypertension presents for follow-up of biopsy results and discussion of management of locally advanced HCC. She continued to have poor appetite, weight loss 4 pounds since visit 10 days ago.  Intermittent abdominal pain.   Review of Systems  Constitutional: Positive for appetite change, fatigue and unexpected weight change. Negative for chills and fever.    HENT:   Negative for hearing loss and voice change.   Eyes: Negative for eye problems.  Respiratory: Negative for chest tightness and cough.   Cardiovascular: Negative for chest pain.  Gastrointestinal: Positive for abdominal pain. Negative for abdominal distention and blood in stool.  Endocrine: Negative for hot flashes.  Genitourinary: Negative for difficulty urinating and frequency.   Musculoskeletal: Negative for arthralgias.  Skin: Negative for itching and rash.  Neurological: Negative for extremity weakness.  Hematological: Negative for adenopathy.  Psychiatric/Behavioral: Negative for confusion.      Allergies  Allergen Reactions   Lisinopril Other (See Comments)    Other reaction(s): Cough     Past Medical History:  Diagnosis Date   Hypertension    not currently taking meds     Past Surgical History:  Procedure Laterality Date   BREAST BIOPSY Left    neg   CHOLECYSTECTOMY  1992    Social History   Socioeconomic History   Marital status: Widowed    Spouse name: Not on file   Number of children: Not on file   Years of education: Not on file   Highest education level: Not on file  Occupational History   Not on file  Social Needs   Financial resource strain: Not on file   Food insecurity    Worry: Not on file    Inability: Not on file   Transportation needs    Medical: Not on file    Non-medical: Not on file  Tobacco Use   Smoking status: Never Smoker   Smokeless tobacco: Never Used  Substance and Sexual Activity   Alcohol use: No   Drug use: No   Sexual activity: Not on file  Lifestyle   Physical activity    Days per week: Not  on file    Minutes per session: Not on file   Stress: Not on file  Relationships   Social connections    Talks on phone: Not on file    Gets together: Not on file    Attends religious service: Not on file    Active member of club or organization: Not on file    Attends meetings of clubs or  organizations: Not on file    Relationship status: Not on file   Intimate partner violence    Fear of current or ex partner: Not on file    Emotionally abused: Not on file    Physically abused: Not on file    Forced sexual activity: Not on file  Other Topics Concern   Not on file  Social History Narrative   Not on file    Family History  Problem Relation Age of Onset   Alzheimer's disease Mother    Stroke Father    Breast cancer Daughter 9       [place], genetic test negative     Current Outpatient Medications:    Ascorbic Acid (VITAMIN C) 1000 MG tablet, Take by mouth., Disp: , Rfl:    azelastine (ASTELIN) 0.1 % nasal spray, PLACE 1 SPRAY INTO BOTH NOSTRILS 2 (TWO) TIMES DAILY, Disp: , Rfl: 1   Cholecalciferol (VITAMIN D) 1000 UNITS capsule, Take 1,000 Units by mouth., Disp: , Rfl:    COD LIVER OIL PO, Take by mouth., Disp: , Rfl:    Flax OIL, Take by mouth., Disp: , Rfl:    magnesium 30 MG tablet, Take 30 mg by mouth., Disp: , Rfl:    Multiple Vitamin (MULTIVITAMIN) tablet, Take 1 tablet by mouth. , Disp: , Rfl:    ondansetron (ZOFRAN) 8 MG tablet, Take 1 tablet (8 mg total) by mouth 2 (two) times daily as needed (Nausea or vomiting)., Disp: 30 tablet, Rfl: 1   pantoprazole (PROTONIX) 20 MG tablet, Take 20 mg by mouth daily., Disp: , Rfl:    potassium chloride (K-DUR) 10 MEQ tablet, Take 1 tablet (10 mEq total) by mouth daily., Disp: 30 tablet, Rfl: 0   prochlorperazine (COMPAZINE) 10 MG tablet, Take 1 tablet (10 mg total) by mouth every 6 (six) hours as needed (Nausea or vomiting)., Disp: 30 tablet, Rfl: 1   traMADol (ULTRAM) 50 MG tablet, Take 1 tablet (50 mg total) by mouth every 8 (eight) hours as needed., Disp: 30 tablet, Rfl: 0   vitamin B-12 (CYANOCOBALAMIN) 100 MCG tablet, Take 50 mcg by mouth daily., Disp: , Rfl:    VITAMIN E PO, Take 1 tablet by mouth. , Disp: , Rfl:    dronabinol (MARINOL) 5 MG capsule, Take 1 capsule (5 mg total) by mouth 2 (two)  times daily before a meal. (Patient not taking: Reported on 12/23/2018), Disp: 30 capsule, Rfl: 0   loperamide (IMODIUM) 2 MG capsule, Take 1 capsule (2 mg total) by mouth See admin instructions. With onset of loose stool, take 4mg  followed by 2mg  every 2 hours until 12 hours have passed without loose bowel movement. Maximum: 16 mg/day, Disp: 120 capsule, Rfl: 1   potassium gluconate (RA POTASSIUM GLUCONATE) 595 (99 K) MG TABS tablet, Take by mouth., Disp: , Rfl:   Physical exam:  Vitals:   12/23/18 0839  BP: 127/85  Pulse: (!) 106  Resp: 18  Temp: (!) 97.5 F (36.4 C)  SpO2: 95%  Weight: 170 lb 4.8 oz (77.2 kg)   Physical Exam Constitutional:  General: She is not in acute distress. HENT:     Head: Normocephalic and atraumatic.  Eyes:     General: Scleral icterus present.     Pupils: Pupils are equal, round, and reactive to light.  Neck:     Musculoskeletal: Normal range of motion and neck supple.  Cardiovascular:     Rate and Rhythm: Normal rate and regular rhythm.     Heart sounds: Normal heart sounds.  Pulmonary:     Effort: Pulmonary effort is normal. No respiratory distress.     Breath sounds: No wheezing.  Abdominal:     General: Bowel sounds are normal. There is no distension.     Palpations: Abdomen is soft. There is no mass.     Tenderness: There is no abdominal tenderness.  Musculoskeletal: Normal range of motion.        General: No deformity.  Skin:    General: Skin is warm and dry.     Findings: No erythema or rash.  Neurological:     Mental Status: She is alert and oriented to person, place, and time.     Cranial Nerves: No cranial nerve deficit.     Coordination: Coordination normal.  Psychiatric:        Behavior: Behavior normal.        Thought Content: Thought content normal.     CMP Latest Ref Rng & Units 12/23/2018  Glucose 70 - 99 mg/dL 107(H)  BUN 8 - 23 mg/dL 6(L)  Creatinine 0.44 - 1.00 mg/dL 0.70  Sodium 135 - 145 mmol/L 136  Potassium  3.5 - 5.1 mmol/L 3.0(L)  Chloride 98 - 111 mmol/L 101  CO2 22 - 32 mmol/L 22  Calcium 8.9 - 10.3 mg/dL 9.2  Total Protein 6.5 - 8.1 g/dL 7.5  Total Bilirubin 0.3 - 1.2 mg/dL 2.0(H)  Alkaline Phos 38 - 126 U/L 236(H)  AST 15 - 41 U/L 87(H)  ALT 0 - 44 U/L 31   CBC Latest Ref Rng & Units 12/23/2018  WBC 4.0 - 10.5 K/uL 11.1(H)  Hemoglobin 12.0 - 15.0 g/dL 10.3(L)  Hematocrit 36.0 - 46.0 % 33.4(L)  Platelets 150 - 400 K/uL 334    RADIOGRAPHIC STUDIES: I have personally reviewed the radiological images as listed and agreed with the findings in the report.  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  No definitive stigmata of cirrhosis/portal venous hypertension or discrete hepatic lesion.  5/14/ 2020 MRI with/without/MRCP the 2.4 cm mass described in segment 8 also appears to represent enhancing tumor thrombus, as demonstrated elsewhere throughout the right, left, and main portal veins. The hepatic neoplasm is much larger and very infiltrative in appearance,best measured on opposed-phase gradient echo sequence #4/image #10, at 8.0 x 11.5 cm. This involves segments 7, 8 and 4A/B, and is highly suspicious for an infiltrative cholangiocarcinoma or possibly hepatocellular carcinoma.   Ct Chest Wo Contrast  Result Date: 12/15/2018 CLINICAL DATA:  Hepatocellular carcinoma staging. Chest pain. Portal vein thrombosis. EXAM: CT CHEST WITHOUT CONTRAST TECHNIQUE: Multidetector CT imaging of the chest was performed following the standard protocol without IV contrast. COMPARISON:  Abdominal CT 11/05/2018.  No prior chest imaging. FINDINGS: Cardiovascular: Aortic atherosclerosis. Tortuous thoracic aorta. Borderline cardiomegaly. Multivessel coronary artery atherosclerosis. Mediastinum/Nodes: No supraclavicular adenopathy. No mediastinal or definite hilar adenopathy, given limitations of unenhanced CT. Lungs/Pleura: Trace right  pleural fluid. No suspicious pulmonary nodule or mass. Areas of bilateral major fissure thickening versus small subpleural lymph nodes.  A right apical subpleural 3 mm nodule on image 29/3 is also most likely a subpleural lymph node. Upper Abdomen: Cirrhosis with heterogeneity throughout the liver, better evaluated on contrast-enhanced studies. New small volume ascites since 11/05/2018. Musculoskeletal: Thoracic spondylosis. IMPRESSION: 1.  No acute process or evidence of metastatic disease in the chest. 2. Coronary artery atherosclerosis. Aortic Atherosclerosis (ICD10-I70.0). 3. Trace right pleural fluid. 4. New small volume ascites. Electronically Signed   By: Abigail Miyamoto M.D.   On: 12/15/2018 08:51   US Biopsy (liver)  Result Date: 12/06/2018 INDICATION: 73 year old female with a history liver mass EXAM: ULTRASOUND-GUIDED BIOPSY LIVER MASS MEDICATIONS: None. ANESTHESIA/SEDATION: Moderate (conscious) sedation was employed during this procedure. A total of Versed 1.5 mg and Fentanyl 75 mcg was administered intravenously. Moderate Sedation Time: 25 minutes. The patient's level of consciousness and vital signs were monitored continuously by radiology nursing throughout the procedure under my direct supervision. FLUOROSCOPY TIME:  ULTRASOUND COMPLICATIONS: NONE PROCEDURE: Informed written consent was obtained from the patient after a thorough discussion of the procedural risks, benefits and alternatives. All questions were addressed. Maximal Sterile Barrier Technique was utilized including caps, mask, sterile gowns, sterile gloves, sterile drape, hand hygiene and skin antiseptic. A timeout was performed prior to the initiation of the procedure Ultrasound survey of the right liver lobe performed with images stored and sent to PACs. The right lower thorax/right upper abdomen was prepped with chlorhexidine in a sterile fashion, and a sterile drape was applied covering the operative field. A sterile gown and sterile  gloves were used for the procedure. Local anesthesia was provided with 1% Lidocaine. Once the patient is prepped and draped sterilely and the skin and subcutaneous tissues were generously infiltrated with 1% lidocaine, a small stab incision was made with an 11 blade scalpel. A 17 gauge introducer needle was advanced under ultrasound guidance in an intercostal location into the right liver lobe, targeting ill-defined parenchyma of the liver tissue of the right liver. The stylet was removed, and multiple separate 18 gauge core biopsy were retrieved. Samples were placed into formalin for transportation to the lab. Gel-Foam slurry was then infused with a small amount of saline for assistance with hemostasis. The needle was removed, and a final ultrasound image was performed. The patient tolerated the procedure well and remained hemodynamically stable throughout. No complications were encountered and no significant blood loss was encounter. IMPRESSION: Status post ultrasound-guided biopsy of ill-defined mass of the right liver. Signed, Dulcy Fanny. Dellia Nims, RPVI [facility] Radiology Electronically Signed   By: Corrie Mckusick D.O.   On: 12/06/2018 10:17      Assessment and plan Patient is a 73 y.o. female  With history of hep C and HTN presents for discussion of management of newly diagnosed [place].  1. Hepatocellular carcinoma ([place])   2. Hypokalemia   3. Goals of care, counseling/discussion   4. Generalized abdominal pain    Labs are reviewed and discussed with patient.  She has had chemotherapy class.   Recommend atezolizumab plus bevacizumab Q3 weeks regimen [Phase 3 [id number] study- combination of atezolizumab plus bevacizumab give 42% decrease in the risk of death, 41% reduced risk of disease progression or death, versus the sorafenib ORR 27% vs 12%]   #Extensive portal vein thrombosis, with associated cavernous transformation, chronic.  Hold urgent  anticoagulation for now, given unknown esophageal varice status and the nature of chronic thrombus.   # Pre-existing proteinuria, close monitor.  # Anemia, low retic hemoglobin, indicating  IDA.   # Hepatitis C treated.  # Hypokalemia, K is 3, recommend increase oral potassium chloride to 2 0 mEq daily.   #Poor appetite,  Refer to dietitian.   # Refer to establish care with palliative care.  # Abdominal pain, continue tramadol as instructed.   Supportive care measures are necessary for patient well-being and will be provided as necessary. We spent sufficient time to discuss many aspect of care, questions were answered to patient's satisfaction. Patient does not want family discussion to be involved at this point.   Orders Placed This Encounter  Procedures   Retic Panel    Standing Status:   Future    Number of Occurrences:   1    Standing Expiration Date:   12/23/2019   Magnesium    Standing Status:   Future    Number of Occurrences:   1    Standing Expiration Date:   12/23/2019   Protein, urine, random    Standing Status:   Standing    Number of Occurrences:   20    Standing Expiration Date:   12/23/2019    Follow up in 1 week  We spent sufficient time to discuss many aspect of care, questions were answered to patient's satisfaction. Total face to face encounter time for this patient visit was 25 min. >50% of the time was  spent in counseling and coordination of care.  Earlie Server, MD, PhD Hematology Oncology [facility] Pager- [phone] 12/25/2018

## 2018-12-27 ENCOUNTER — Emergency Department: Payer: Medicare Other

## 2018-12-27 ENCOUNTER — Other Ambulatory Visit: Payer: Self-pay

## 2018-12-27 ENCOUNTER — Inpatient Hospital Stay: Payer: Medicare Other

## 2018-12-27 ENCOUNTER — Inpatient Hospital Stay
Admission: EM | Admit: 2018-12-27 | Discharge: 2019-01-29 | DRG: 441 | Disposition: E | Payer: Medicare Other | Attending: Internal Medicine | Admitting: Internal Medicine

## 2018-12-27 ENCOUNTER — Telehealth: Payer: Self-pay | Admitting: Oncology

## 2018-12-27 DIAGNOSIS — Z515 Encounter for palliative care: Secondary | ICD-10-CM

## 2018-12-27 DIAGNOSIS — G893 Neoplasm related pain (acute) (chronic): Secondary | ICD-10-CM

## 2018-12-27 DIAGNOSIS — I2510 Atherosclerotic heart disease of native coronary artery without angina pectoris: Secondary | ICD-10-CM | POA: Diagnosis present

## 2018-12-27 DIAGNOSIS — R6520 Severe sepsis without septic shock: Secondary | ICD-10-CM

## 2018-12-27 DIAGNOSIS — R748 Abnormal levels of other serum enzymes: Secondary | ICD-10-CM

## 2018-12-27 DIAGNOSIS — E872 Acidosis: Secondary | ICD-10-CM | POA: Diagnosis present

## 2018-12-27 DIAGNOSIS — D72823 Leukemoid reaction: Secondary | ICD-10-CM | POA: Diagnosis present

## 2018-12-27 DIAGNOSIS — K7200 Acute and subacute hepatic failure without coma: Secondary | ICD-10-CM | POA: Diagnosis present

## 2018-12-27 DIAGNOSIS — Z66 Do not resuscitate: Secondary | ICD-10-CM | POA: Diagnosis present

## 2018-12-27 DIAGNOSIS — K767 Hepatorenal syndrome: Secondary | ICD-10-CM

## 2018-12-27 DIAGNOSIS — R740 Nonspecific elevation of levels of transaminase and lactic acid dehydrogenase [LDH]: Secondary | ICD-10-CM | POA: Diagnosis not present

## 2018-12-27 DIAGNOSIS — Z9221 Personal history of antineoplastic chemotherapy: Secondary | ICD-10-CM | POA: Diagnosis not present

## 2018-12-27 DIAGNOSIS — R945 Abnormal results of liver function studies: Secondary | ICD-10-CM

## 2018-12-27 DIAGNOSIS — Z20828 Contact with and (suspected) exposure to other viral communicable diseases: Secondary | ICD-10-CM | POA: Diagnosis present

## 2018-12-27 DIAGNOSIS — R7989 Other specified abnormal findings of blood chemistry: Secondary | ICD-10-CM

## 2018-12-27 DIAGNOSIS — J9602 Acute respiratory failure with hypercapnia: Secondary | ICD-10-CM | POA: Diagnosis not present

## 2018-12-27 DIAGNOSIS — I81 Portal vein thrombosis: Secondary | ICD-10-CM | POA: Diagnosis present

## 2018-12-27 DIAGNOSIS — J9601 Acute respiratory failure with hypoxia: Secondary | ICD-10-CM | POA: Diagnosis not present

## 2018-12-27 DIAGNOSIS — G9340 Encephalopathy, unspecified: Secondary | ICD-10-CM | POA: Diagnosis not present

## 2018-12-27 DIAGNOSIS — E860 Dehydration: Secondary | ICD-10-CM

## 2018-12-27 DIAGNOSIS — R4182 Altered mental status, unspecified: Secondary | ICD-10-CM

## 2018-12-27 DIAGNOSIS — Z9049 Acquired absence of other specified parts of digestive tract: Secondary | ICD-10-CM | POA: Diagnosis not present

## 2018-12-27 DIAGNOSIS — R570 Cardiogenic shock: Secondary | ICD-10-CM | POA: Diagnosis not present

## 2018-12-27 DIAGNOSIS — T380X5A Adverse effect of glucocorticoids and synthetic analogues, initial encounter: Secondary | ICD-10-CM | POA: Diagnosis present

## 2018-12-27 DIAGNOSIS — R112 Nausea with vomiting, unspecified: Secondary | ICD-10-CM | POA: Diagnosis not present

## 2018-12-27 DIAGNOSIS — E876 Hypokalemia: Secondary | ICD-10-CM | POA: Diagnosis present

## 2018-12-27 DIAGNOSIS — R188 Other ascites: Secondary | ICD-10-CM

## 2018-12-27 DIAGNOSIS — K529 Noninfective gastroenteritis and colitis, unspecified: Secondary | ICD-10-CM | POA: Diagnosis not present

## 2018-12-27 DIAGNOSIS — D72829 Elevated white blood cell count, unspecified: Secondary | ICD-10-CM | POA: Diagnosis not present

## 2018-12-27 DIAGNOSIS — E870 Hyperosmolality and hypernatremia: Secondary | ICD-10-CM | POA: Diagnosis not present

## 2018-12-27 DIAGNOSIS — G4700 Insomnia, unspecified: Secondary | ICD-10-CM | POA: Diagnosis present

## 2018-12-27 DIAGNOSIS — Z7189 Other specified counseling: Secondary | ICD-10-CM | POA: Diagnosis not present

## 2018-12-27 DIAGNOSIS — C220 Liver cell carcinoma: Secondary | ICD-10-CM | POA: Diagnosis present

## 2018-12-27 DIAGNOSIS — A419 Sepsis, unspecified organism: Secondary | ICD-10-CM | POA: Diagnosis present

## 2018-12-27 DIAGNOSIS — E790 Hyperuricemia without signs of inflammatory arthritis and tophaceous disease: Secondary | ICD-10-CM | POA: Diagnosis not present

## 2018-12-27 DIAGNOSIS — Z8619 Personal history of other infectious and parasitic diseases: Secondary | ICD-10-CM | POA: Diagnosis not present

## 2018-12-27 DIAGNOSIS — Z79899 Other long term (current) drug therapy: Secondary | ICD-10-CM

## 2018-12-27 DIAGNOSIS — R180 Malignant ascites: Secondary | ICD-10-CM | POA: Diagnosis not present

## 2018-12-27 DIAGNOSIS — J9690 Respiratory failure, unspecified, unspecified whether with hypoxia or hypercapnia: Secondary | ICD-10-CM

## 2018-12-27 DIAGNOSIS — I10 Essential (primary) hypertension: Secondary | ICD-10-CM | POA: Diagnosis present

## 2018-12-27 DIAGNOSIS — Z888 Allergy status to other drugs, medicaments and biological substances status: Secondary | ICD-10-CM | POA: Diagnosis not present

## 2018-12-27 DIAGNOSIS — Z978 Presence of other specified devices: Secondary | ICD-10-CM

## 2018-12-27 DIAGNOSIS — E7220 Disorder of urea cycle metabolism, unspecified: Secondary | ICD-10-CM

## 2018-12-27 DIAGNOSIS — R609 Edema, unspecified: Secondary | ICD-10-CM

## 2018-12-27 DIAGNOSIS — J690 Pneumonitis due to inhalation of food and vomit: Secondary | ICD-10-CM | POA: Diagnosis not present

## 2018-12-27 DIAGNOSIS — R400 Somnolence: Secondary | ICD-10-CM | POA: Diagnosis not present

## 2018-12-27 DIAGNOSIS — N179 Acute kidney failure, unspecified: Secondary | ICD-10-CM | POA: Diagnosis present

## 2018-12-27 DIAGNOSIS — J9600 Acute respiratory failure, unspecified whether with hypoxia or hypercapnia: Secondary | ICD-10-CM

## 2018-12-27 DIAGNOSIS — Z452 Encounter for adjustment and management of vascular access device: Secondary | ICD-10-CM

## 2018-12-27 DIAGNOSIS — Z4659 Encounter for fitting and adjustment of other gastrointestinal appliance and device: Secondary | ICD-10-CM

## 2018-12-27 LAB — URINALYSIS, COMPLETE (UACMP) WITH MICROSCOPIC
Bilirubin Urine: NEGATIVE
Glucose, UA: NEGATIVE mg/dL
Ketones, ur: 5 mg/dL — AB
Leukocytes,Ua: NEGATIVE
Nitrite: NEGATIVE
Protein, ur: 100 mg/dL — AB
Specific Gravity, Urine: 1.018 (ref 1.005–1.030)
pH: 5 (ref 5.0–8.0)

## 2018-12-27 LAB — COMPREHENSIVE METABOLIC PANEL
ALT: 98 U/L — ABNORMAL HIGH (ref 0–44)
AST: 422 U/L — ABNORMAL HIGH (ref 15–41)
Albumin: 3.4 g/dL — ABNORMAL LOW (ref 3.5–5.0)
Alkaline Phosphatase: 243 U/L — ABNORMAL HIGH (ref 38–126)
Anion gap: 23 — ABNORMAL HIGH (ref 5–15)
BUN: 50 mg/dL — ABNORMAL HIGH (ref 8–23)
CO2: 17 mmol/L — ABNORMAL LOW (ref 22–32)
Calcium: 9.7 mg/dL (ref 8.9–10.3)
Chloride: 95 mmol/L — ABNORMAL LOW (ref 98–111)
Creatinine, Ser: 3.28 mg/dL — ABNORMAL HIGH (ref 0.44–1.00)
GFR calc Af Amer: 16 mL/min — ABNORMAL LOW (ref >60)
GFR calc non Af Amer: 13 mL/min — ABNORMAL LOW (ref >60)
Glucose, Bld: 122 mg/dL — ABNORMAL HIGH (ref 70–99)
Potassium: 3.6 mmol/L (ref 3.5–5.1)
Sodium: 135 mmol/L (ref 135–145)
Total Bilirubin: 2.8 mg/dL — ABNORMAL HIGH (ref 0.3–1.2)
Total Protein: 8.3 g/dL — ABNORMAL HIGH (ref 6.5–8.1)

## 2018-12-27 LAB — CBC WITH DIFFERENTIAL/PLATELET
Abs Immature Granulocytes: 0.24 10*3/uL — ABNORMAL HIGH (ref 0.00–0.07)
Basophils Absolute: 0.2 10*3/uL — ABNORMAL HIGH (ref 0.0–0.1)
Basophils Relative: 1 %
Eosinophils Absolute: 0.1 10*3/uL (ref 0.0–0.5)
Eosinophils Relative: 0 %
HCT: 40.6 % (ref 36.0–46.0)
Hemoglobin: 12.7 g/dL (ref 12.0–15.0)
Immature Granulocytes: 1 %
Lymphocytes Relative: 4 %
Lymphs Abs: 0.8 10*3/uL (ref 0.7–4.0)
MCH: 23.6 pg — ABNORMAL LOW (ref 26.0–34.0)
MCHC: 31.3 g/dL (ref 30.0–36.0)
MCV: 75.6 fL — ABNORMAL LOW (ref 80.0–100.0)
Monocytes Absolute: 1.8 10*3/uL — ABNORMAL HIGH (ref 0.1–1.0)
Monocytes Relative: 10 %
Neutro Abs: 14.5 10*3/uL — ABNORMAL HIGH (ref 1.7–7.7)
Neutrophils Relative %: 84 %
Platelets: 395 10*3/uL (ref 150–400)
RBC: 5.37 MIL/uL — ABNORMAL HIGH (ref 3.87–5.11)
RDW: 20.7 % — ABNORMAL HIGH (ref 11.5–15.5)
Smear Review: NORMAL
WBC: 17.5 10*3/uL — ABNORMAL HIGH (ref 4.0–10.5)
nRBC: 1.3 % — ABNORMAL HIGH (ref 0.0–0.2)

## 2018-12-27 LAB — URIC ACID: Uric Acid, Serum: 10.6 mg/dL — ABNORMAL HIGH (ref 2.5–7.1)

## 2018-12-27 LAB — PROCALCITONIN: Procalcitonin: 4.65 ng/mL

## 2018-12-27 LAB — LACTIC ACID, PLASMA
Lactic Acid, Venous: 5.6 mmol/L (ref 0.5–1.9)
Lactic Acid, Venous: 4.3 mmol/L (ref 0.5–1.9)

## 2018-12-27 LAB — GLUCOSE, CAPILLARY
Glucose-Capillary: 119 mg/dL — ABNORMAL HIGH (ref 70–99)
Glucose-Capillary: 95 mg/dL (ref 70–99)

## 2018-12-27 LAB — MRSA PCR SCREENING: MRSA by PCR: NEGATIVE

## 2018-12-27 LAB — PHOSPHORUS: Phosphorus: 2.7 mg/dL (ref 2.5–4.6)

## 2018-12-27 LAB — BILIRUBIN, DIRECT: Bilirubin, Direct: 1.5 mg/dL — ABNORMAL HIGH (ref 0.0–0.2)

## 2018-12-27 LAB — LACTATE DEHYDROGENASE: LDH: 1316 U/L — ABNORMAL HIGH (ref 98–192)

## 2018-12-27 LAB — AMMONIA: Ammonia: 121 umol/L — ABNORMAL HIGH (ref 9–35)

## 2018-12-27 LAB — SARS CORONAVIRUS 2 BY RT PCR (HOSPITAL ORDER, PERFORMED IN ~~LOC~~ HOSPITAL LAB): SARS Coronavirus 2: NEGATIVE

## 2018-12-27 LAB — MAGNESIUM: Magnesium: 2.7 mg/dL — ABNORMAL HIGH (ref 1.7–2.4)

## 2018-12-27 MED ORDER — VANCOMYCIN HCL IN DEXTROSE 1-5 GM/200ML-% IV SOLN: 1000.0000 mg | Freq: Once | INTRAVENOUS | Status: DC

## 2018-12-27 MED ORDER — ACETAMINOPHEN 325 MG PO TABS: 650.0000 mg | ORAL_TABLET | Freq: Four times a day (QID) | ORAL | Status: DC | PRN

## 2018-12-27 MED ORDER — SODIUM CHLORIDE 0.9 % IV SOLN
1000.0000 mL | Freq: Once | INTRAVENOUS | Status: AC
  Administered 2018-12-27: 1000 mL via INTRAVENOUS

## 2018-12-27 MED ORDER — VANCOMYCIN HCL IN DEXTROSE 1-5 GM/200ML-% IV SOLN
1000.0000 mg | Freq: Once | INTRAVENOUS | Status: AC
  Administered 2018-12-27: 1000 mg via INTRAVENOUS
  Filled 2018-12-27: qty 200

## 2018-12-27 MED ORDER — SODIUM CHLORIDE 0.9 % IV SOLN: INTRAVENOUS | Status: DC

## 2018-12-27 MED ORDER — SODIUM CHLORIDE 0.9 % IV BOLUS (SEPSIS)
1000.0000 mL | Freq: Once | INTRAVENOUS | Status: AC
  Administered 2018-12-27: 1000 mL via INTRAVENOUS

## 2018-12-27 MED ORDER — METRONIDAZOLE IN NACL 5-0.79 MG/ML-% IV SOLN
500.0000 mg | Freq: Once | INTRAVENOUS | Status: AC
  Administered 2018-12-27: 500 mg via INTRAVENOUS
  Filled 2018-12-27: qty 100

## 2018-12-27 MED ORDER — SODIUM CHLORIDE 0.9 % IV BOLUS (SEPSIS)
500.0000 mL | Freq: Once | INTRAVENOUS | Status: AC
  Administered 2018-12-27: 500 mL via INTRAVENOUS

## 2018-12-27 MED ORDER — ONDANSETRON HCL 4 MG PO TABS: 4.0000 mg | ORAL_TABLET | Freq: Four times a day (QID) | ORAL | Status: DC | PRN

## 2018-12-27 MED ORDER — LACTULOSE 10 GM/15ML PO SOLN: 30.0000 g | Freq: Three times a day (TID) | ORAL | Status: DC

## 2018-12-27 MED ORDER — VANCOMYCIN VARIABLE DOSE PER UNSTABLE RENAL FUNCTION (PHARMACIST DOSING): Status: DC

## 2018-12-27 MED ORDER — LACTATED RINGERS IV SOLN
INTRAVENOUS | Status: DC
  Administered 2018-12-27 – 2018-12-28 (×3): via INTRAVENOUS

## 2018-12-27 MED ORDER — VANCOMYCIN HCL 500 MG IV SOLR
500.0000 mg | Freq: Once | INTRAVENOUS | Status: AC
  Administered 2018-12-27: 500 mg via INTRAVENOUS
  Filled 2018-12-27: qty 500

## 2018-12-27 MED ORDER — RIFAXIMIN 550 MG PO TABS: 550.0000 mg | ORAL_TABLET | Freq: Two times a day (BID) | ORAL | Status: DC

## 2018-12-27 MED ORDER — FAMOTIDINE IN NACL 20-0.9 MG/50ML-% IV SOLN
20.0000 mg | Freq: Every day | INTRAVENOUS | Status: DC
  Administered 2018-12-27 – 2018-12-29 (×3): 20 mg via INTRAVENOUS
  Filled 2018-12-27 (×3): qty 50

## 2018-12-27 MED ORDER — HEPARIN SODIUM (PORCINE) 5000 UNIT/ML IJ SOLN
5000.0000 [IU] | Freq: Three times a day (TID) | INTRAMUSCULAR | Status: DC
  Administered 2018-12-27 – 2018-12-30 (×8): 5000 [IU] via SUBCUTANEOUS
  Filled 2018-12-27 (×9): qty 1

## 2018-12-27 MED ORDER — PIPERACILLIN-TAZOBACTAM 3.375 G IVPB: 3.3750 g | Freq: Two times a day (BID) | INTRAVENOUS | Status: DC

## 2018-12-27 MED ORDER — SODIUM CHLORIDE 0.9 % IV SOLN
1.0000 g | Freq: Once | INTRAVENOUS | Status: DC
  Filled 2018-12-27: qty 1

## 2018-12-27 MED ORDER — LACTULOSE ENEMA
300.0000 mL | Freq: Two times a day (BID) | ORAL | Status: DC
  Administered 2018-12-27 (×2): 300 mL via RECTAL
  Filled 2018-12-27 (×3): qty 300

## 2018-12-27 MED ORDER — ONDANSETRON HCL 4 MG/2ML IJ SOLN
4.0000 mg | Freq: Four times a day (QID) | INTRAMUSCULAR | Status: DC | PRN
  Administered 2019-01-02 – 2019-01-06 (×4): 4 mg via INTRAVENOUS
  Filled 2018-12-27 (×4): qty 2

## 2018-12-27 MED ORDER — SODIUM CHLORIDE 0.9 % IV SOLN
1.0000 g | Freq: Two times a day (BID) | INTRAVENOUS | Status: DC
  Administered 2018-12-27 – 2018-12-28 (×3): 1 g via INTRAVENOUS
  Filled 2018-12-27 (×5): qty 1

## 2018-12-27 MED ORDER — ACETAMINOPHEN 650 MG RE SUPP: 650.0000 mg | Freq: Four times a day (QID) | RECTAL | Status: DC | PRN

## 2018-12-27 MED ORDER — ENOXAPARIN SODIUM 30 MG/0.3ML ~~LOC~~ SOLN: 30.0000 mg | SUBCUTANEOUS | Status: DC

## 2018-12-27 MED ORDER — SODIUM CHLORIDE 0.9 % IV SOLN
2.0000 g | Freq: Once | INTRAVENOUS | Status: AC
  Administered 2018-12-27: 2 g via INTRAVENOUS
  Filled 2018-12-27: qty 2

## 2018-12-27 NOTE — Progress Notes (Signed)
Advanced care plan. Purpose of the Encounter: CODE STATUS Parties in Attendance:Patient and Patients daughter Patient's Decision Capacity:Not good Subjective/Patient's story: Abigail Ayers  is a 73 y.o. female with a known history of hypertension, hepatocellular carcinoma presented to the emergency room for confusion  Patient is altered.  Unable to give any history.  Daughter was at bedside.  She was worked up in the emergency room lactic acid is elevated appears dry and dehydrated.  Patient was worked up with CT head which showed no acute abnormality.  Ammonia level is elevated. Objective/Medical story Patient needs lactulose for hepatic encephalopathy on broad-spectrum antibiotics for sepsis.  Needs IV fluids for acute kidney injury. Goals of care determination:  Advance care directives and goals of care discussed with patients family in detail. Patients family wants everything done which includes cpr, intubation and ventilator if need arises. CODE STATUS: Full code Time spent discussing advanced care planning: 16 minutes

## 2018-12-27 NOTE — Progress Notes (Signed)
Pharmacy Electrolyte Monitoring Consult:  Pharmacy consulted to assist in monitoring and replacing electrolytes in this 73 y.o. female admitted on 12/08/2018 with Altered Mental Status   Labs:  Sodium (mmol/L)  Date Value  11/30/2018 135   Potassium (mmol/L)  Date Value  11/29/2018 3.6   Magnesium (mg/dL)  Date Value  12/04/2018 2.7 (H)   Calcium (mg/dL)  Date Value  12/03/2018 9.7   Albumin (g/dL)  Date Value  12/24/2018 3.4 (L)    Assessment/Plan: Patient receiving lactulose enema BID and unable to take oral medications.   Will defer replacement at this time.   Will obtain electrolytes with am labs.   Pharmacy will continue to monitor and adjust per consult.    Rafik Koppel L 12/26/2018 5:31 PM

## 2018-12-27 NOTE — Progress Notes (Addendum)
Flexiseal placed for lactulose dose. Tolerated procedure well. Still very somulent. ONly reacts to pain. Does not answer to name. Oncologist and GI Doctors in to see patient.Patient not alert enough to swallow . No change in neuro status since admission.

## 2018-12-27 NOTE — ED Notes (Signed)
Dr Corky Downs notified of critical lactic acid of 5.6 - no new orders given at this time

## 2018-12-27 NOTE — ED Notes (Signed)
Report given to ICU charge nurse. Patient in route to ICU.

## 2018-12-27 NOTE — Progress Notes (Signed)
Notified bedside nurse of need to draw lactic acid.  

## 2018-12-27 NOTE — ED Notes (Signed)
Lab called and stated that with the small amount of urine obtained that they could not run UA and urine culture - per Dr Corky Downs to run UA and not urine culture

## 2018-12-27 NOTE — Telephone Encounter (Signed)
Daughter called that patient has multiple episodes of diarrhea, only drink very litter over the weekend, disoriented.  [place] First chemo with Avastin and Tecentriq on 12/23/2018.  Advise daughter to call EMS or send patient to ER for evaluation. She voices understanding.

## 2018-12-27 NOTE — ED Notes (Signed)
IV assess attempted x2 without success Pt keeps both arms bent so access in [facility] is not feasible - IVs will not thread so unable to obtain blood  Will placed for IV team consult Pt is confused and disoriented - daughter is at bedside  Pt had first dose of chemo Thursday, had decreased appetite over the weekend and then yesterday started with confusion and disorientation/not responding to family

## 2018-12-27 NOTE — Consult Note (Signed)
[name], MD [address], [place], [place], [place], [id number], [place], [place], [place], [address] Phone: [phone]  Fax: [phone]  Consultation  Referring Provider:     Dr. Estanislado Pandy Primary Care Physician:  Sharyne Peach, MD Reason for Consultation:     Hepatic encephalopathy  Date of Admission:  12/03/2018 Date of Consultation:  12/18/2018         HPI:   Abigail Ayers is a 73 y.o. female pt of Dr. Alice Reichert, [facility], with history of [place] treated in 2016 with Harvoni and attained SVR, now with newly diagnosed [facility] confirmed by liver biopsy on December 06, 2018.  Patient being followed by oncology.  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  Patient has been started on chemotherapy.  Admitted with altered mental status with no acute infarct seen on CT head.  Daughter at bedside.  Daughter states patient was noted to be confused this morning.  She was at baseline yesterday.  Lives alone and is able to take care of self without difficulty.  Daughter reports patient had nausea vomiting and diarrhea after 1 dose of chemotherapy.  Patient has chronically elevated alk phos which remains elevated around her baseline. However, transaminases are further acutely elevated to AST of 422, ALT of 98.  Total bilirubin 2.8.  Patient also has acute AKI with creatinine of 3.28 and elevated BUN at 50.  Acutely elevated white count.  "Last colonoscopy 06/2013 performed by Dr. Hipolito Bayley at [facility] showed non-bleeding internal hemorrhoids, small lipoma in ascending colon, otherwise normal examined colon. "  Past Medical History:  Diagnosis Date   Hypertension    not currently taking meds    Past Surgical History:  Procedure Laterality Date   BREAST BIOPSY Left    neg   CHOLECYSTECTOMY  1992    Prior to Admission medications   Medication Sig Start Date End Date Taking? Authorizing Provider  Cholecalciferol  (VITAMIN D) 1000 UNITS capsule Take 1,000 Units by mouth.   Yes [provider]  dronabinol (MARINOL) 5 MG capsule Take 1 capsule (5 mg total) by mouth 2 (two) times daily before a meal. 12/16/18  Yes Earlie Server, MD  loperamide (IMODIUM) 2 MG capsule Take 1 capsule (2 mg total) by mouth See admin instructions. With onset of loose stool, take 42m followed by 217mevery 2 hours until 12 hours have passed without loose bowel movement. Maximum: 16 mg/day 12/24/18  Yes YuEarlie ServerMD  Multiple Vitamin (MULTIVITAMIN) tablet Take 1 tablet by mouth.    Yes [provider]  ondansetron (ZOFRAN) 8 MG tablet Take 1 tablet (8 mg total) by mouth 2 (two) times daily as needed (Nausea or vomiting). 12/16/18  Yes YuEarlie ServerMD  potassium chloride (K-DUR) 10 MEQ tablet Take 1 tablet (10 mEq total) by mouth daily. 12/16/18  Yes YuEarlie ServerMD  prochlorperazine (COMPAZINE) 10 MG tablet Take 1 tablet (10 mg total) by mouth every 6 (six) hours as needed (Nausea or vomiting). 12/16/18  Yes YuEarlie ServerMD  traMADol (ULTRAM) 50 MG tablet Take 1 tablet (50 mg total) by mouth every 8 (eight) hours as needed. 11/25/18  Yes YuEarlie ServerMD  vitamin B-12 (CYANOCOBALAMIN) 100 MCG tablet Take 50 mcg by mouth daily.   Yes [provider]    Family History  Problem Relation Age of Onset   Alzheimer's disease Mother    Stroke Father    Breast cancer Daughter  [place], genetic test negative     Social History   Tobacco Use   Smoking status: Never Smoker   Smokeless tobacco: Never Used  Substance Use Topics   Alcohol use: No   Drug use: No    Allergies as of 12/13/2018 - Review Complete 12/24/2018  Allergen Reaction Noted   Lisinopril Other (See Comments) 04/26/2014    Review of Systems:    All systems reviewed and negative except where noted in HPI.   Physical Exam:  Vital signs in last 24 hours: Vitals:   12/15/2018 0813 12/10/2018 0814 12/22/2018 1120 12/16/2018 1239  BP: 105/87  (!) 134/114  (!) 128/96  Pulse: 70  (!) 129 (!) 125  Resp: 18  (!) 30 (!) 26  Temp: 97.8 F (36.6 C)  98.9 F (37.2 C)   TempSrc: Axillary  Axillary   SpO2: 96%  96% 97%  Weight:  77.2 kg    Height:  '5\' 4"'  (1.626 m)       General:   Pleasant, confused Head:  Normocephalic and atraumatic. Eyes:   No icterus.   Conjunctiva pink. PERRLA. Ears:  Normal auditory acuity. Neck:  Supple; no masses or thyroidomegaly Lungs: Respirations even and unlabored. Lungs clear to auscultation bilaterally.   No wheezes, crackles, or rhonchi.  Abdomen:  Soft, nondistended, nontender. Normal bowel sounds. No appreciable masses or hepatomegaly.  No rebound or guarding.  Neurologic: Does not answer questions, confused Skin:  Intact without significant lesions or rashes. Cervical Nodes:  No significant cervical adenopathy. Psych:  Alert and cooperative. Normal affect.  LAB RESULTS: Recent Labs    12/20/2018 0940  WBC 17.5*  HGB 12.7  HCT 40.6  PLT 395   BMET Recent Labs    12/28/2018 0940  NA 135  K 3.6  CL 95*  CO2 17*  GLUCOSE 122*  BUN 50*  CREATININE 3.28*  CALCIUM 9.7   LFT Recent Labs    12/23/2018 0940  PROT 8.3*  ALBUMIN 3.4*  AST 422*  ALT 98*  ALKPHOS 243*  BILITOT 2.8*   PT/INR No results for input(s): LABPROT, INR in the last 72 hours.  STUDIES: Ct Head Wo Contrast  Result Date: 12/03/2018 CLINICAL DATA:  Liver carcinoma with altered mental status/confusion EXAM: CT HEAD WITHOUT CONTRAST TECHNIQUE: Contiguous axial images were obtained from the base of the skull through the vertex without intravenous contrast. COMPARISON:  None. FINDINGS: Brain: Ventricles and sulci are within normal limits for age. There is no demonstrable intracranial mass, hemorrhage, extra-axial fluid collection, or midline shift. There is slight small vessel disease in the centra semiovale bilaterally. No acute infarct evident. Vascular: No hyperdense vessel. There are foci of calcification in each carotid siphon  region. Skull: The bony calvarium appears intact. No neoplastic focus involving bony structures. Sinuses/Orbits: Visualized paranasal sinuses are clear. Visualized orbits appear symmetric bilaterally. Other: Mastoid air cells are clear. IMPRESSION: Mild periventricular small vessel disease. No acute infarct. No mass or hemorrhage. There are foci of arterial vascular calcification. Electronically Signed   By: Lowella Grip III M.D.   On: 12/21/2018 09:46   Dg Chest Port 1 View  Result Date: 12/04/2018 CLINICAL DATA:  73 year old female with history of confusion and disorientation after initial doses of chemotherapy for liver cancer. EXAM: PORTABLE CHEST 1 VIEW COMPARISON:  No priors. FINDINGS: Lung volumes are low. Linear opacities in lung bases bilaterally favored to reflect areas of subsegmental atelectasis or scarring. No consolidative airspace disease.  No pleural effusions. No evidence of pulmonary edema. Heart size is normal. The patient is rotated to the left on today's exam, resulting in distortion of the mediastinal contours and reduced diagnostic sensitivity and specificity for mediastinal pathology. Aortic atherosclerosis. IMPRESSION: 1. Low lung volumes with bibasilar areas of atelectasis and/or scarring (left greater than right). 2. Aortic atherosclerosis. Electronically Signed   By: Vinnie Langton M.D.   On: 11/30/2018 09:13      Impression / Plan:   CIELA MAHAJAN is a 73 y.o. y/o female with newly diagnosed [place], with previous history of hep C treated with Harvoni in 2016 and SVR with GI being consulted for hepatic encephalopathy  Patient's altered mental status is multifactorial given her elevated white count, acute AKI, and recent nausea vomiting and diarrhea after starting chemotherapy  I do not suspect that her altered mental status is all due to hepatic encephalopathy  Either way, the treatment of hepatic encephalopathy includes treating the precipitating causes which could  be infection in the setting and AKI  Would recommend treatment of the above as per primary team with IV fluid hydration as needed.  patient is on lactulose 3 times daily.  Titrate this for the goal of 2-3 bowel movements a day.  Avoid diarrhea as that can further worsen altered mental status.  Nausea reports no diarrhea today.  Only 1 formed small bowel movement.  Start rifaximin twice daily.  Done  Patient has chronic portal vein thromboses and HCC and chronically elevated liver enzymes  However, liver enzymes are markedly elevated More significant elevation is in the transaminases compared to total bilirubin and alk phos  I suspect that the elevation may have to do with her current acute issues and possible dehydration causing hypotension in the setting of sepsis.  Would recommend avoiding hypotension IV fluid resuscitation as necessary Right upper quadrant ultrasound to rule out biliary duct obstruction.  Ordered  If liver enzymes continue to worsen, can obtain further work-up such as MRCP or further liver serology as appropriate  Patient's prognosis remains guarded  Would recommend oncology and nephrology consult on this admission  Above discussed with ICU attending today  Thank you for involving me in the care of this patient.      LOS: 0 days   Virgel Manifold, MD  12/24/2018, 2:24 PM

## 2018-12-27 NOTE — Progress Notes (Signed)
Anticoagulation monitoring(Lovenox):  72 yo female ordered Lovenox 40 mg Q24h  Filed Weights   12/04/2018 0814  Weight: 170 lb 4.8 oz (77.2 kg)   BMI 29   Lab Results  Component Value Date   CREATININE 3.28 (H) 12/19/2018   CREATININE 0.70 12/23/2018   CREATININE 0.72 12/14/2018   Estimated Creatinine Clearance: 15.8 mL/min (A) (by C-G formula based on SCr of 3.28 mg/dL (H)). Hemoglobin & Hematocrit     Component Value Date/Time   HGB 12.7 12/06/2018 0940   HCT 40.6 12/01/2018 0940     Per Protocol for Patient with estCrcl < 30 ml/min and BMI < 40, will transition to Lovenox 30 mg Q24h.

## 2018-12-27 NOTE — Consult Note (Signed)
CRITICAL CARE NOTE      CHIEF COMPLAINT:   Altered mental status with lethargy in context of new diagnosis of hepatocellular carcinoma   HPI   This is a 73 year old female who was brought to the ED and lethargic state and has been recently diagnosed with hepatocellular carcinoma.  She is a generally well-functioning patient with a previous history of essential hypertension, details of her recent history and events are provided by her daughter who is at bedside during evaluation.  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  Her children were unaware of her cancer diagnosis as she had kept it to herself.  Daughter states that patient has been lucid 3 days ago and when she came to see her has noted that she was confused with inability to correctly identify her which prompted her to bring her to the ED.  I had discussed her care with Dr. Bonna Gains who had previously seen her from GI perspective.  On arrival to the ED patient was noted to have acute kidney injury as well as elevated lactic acidosis, elevated ammonia, leukocytosis with bands and microcytosis without anemia.  Empirically she had been started on lactulose and rifaximin and has not had profuse diarrhea since she has been in the MICU.  It is unclear whether she had transient hypotension prior to arrival.  PAST MEDICAL HISTORY   Past Medical History:  Diagnosis Date  . Hypertension    not currently taking meds     SURGICAL HISTORY   Past Surgical History:  Procedure Laterality Date  . BREAST BIOPSY Left    neg  . CHOLECYSTECTOMY  1992     FAMILY HISTORY   Family History  Problem Relation Age of Onset  . Alzheimer's disease  Mother   . Stroke Father   . Breast cancer Daughter 95       [place], genetic test negative     SOCIAL HISTORY   Social History   Tobacco Use  . Smoking status: Never Smoker  . Smokeless tobacco: Never Used  Substance Use Topics  . Alcohol use: No  . Drug use: No     MEDICATIONS   Current Medication:  Current Facility-Administered Medications:  .  acetaminophen (TYLENOL) tablet 650 mg, 650 mg, Oral, Q6H PRN **OR** acetaminophen (TYLENOL) suppository 650 mg, 650 mg, Rectal, Q6H PRN, Pyreddy, Pavan, MD .  famotidine (PEPCID) IVPB 20 mg premix, 20 mg, Intravenous, QHS, Charlett Nose, RPH .  heparin injection 5,000 Units, 5,000 Units, Subcutaneous, Q8H, Charlett Nose, RPH .  lactated ringers infusion, , Intravenous, Continuous, Ottie Glazier, MD, Last Rate: 100 mL/hr at 12/01/2018 1604 .  lactulose (CHRONULAC) enema 200 gm, 300 mL, Rectal, BID, Pink Maye, MD .  ondansetron (ZOFRAN) tablet 4 mg, 4 mg, Oral, Q6H PRN **OR** ondansetron (ZOFRAN) injection 4 mg, 4 mg, Intravenous, Q6H PRN, Pyreddy, Pavan, MD .  piperacillin-tazobactam (ZOSYN) IVPB 3.375 g, 3.375 g, Intravenous, Q12H, Nazari, Walid A, RPH .  rifaximin (XIFAXAN) tablet 550 mg, 550 mg, Oral, BID, Tahiliani, Varnita B, MD .  vancomycin variable dose per unstable renal function (pharmacist dosing), , Does not apply, See admin instructions, Brantley Stage, Walid A, RPH    ALLERGIES   Lisinopril    REVIEW OF SYSTEMS     Unable to obtain due to obtunded state and lethargy  PHYSICAL EXAMINATION  Vitals:   12/16/2018 1400 11/30/2018 1500  BP: 130/80 129/77  Pulse: (!) 118 (!) 119  Resp: (!) 32 (!) 35  Temp: 98 F (36.7 C) 98.3 F (36.8 C)  SpO2: 100% 96%    GENERAL: Age-appropriate patient with altered mental status HEAD: Normocephalic, atraumatic.  EYES: Pupils equal, round, reactive to light.  No scleral icterus.  MOUTH: Moist mucosal membrane. NECK: Supple. No thyromegaly. No nodules. No JVD.   PULMONARY: Mild bibasilar crackles CARDIOVASCULAR: S1 and S2. Regular rate and rhythm. No murmurs, rubs, or gallops.  GASTROINTESTINAL: Soft, nontender, non-distended. No masses. Positive bowel sounds. No hepatosplenomegaly.  MUSCULOSKELETAL: No swelling, clubbing, or edema.  NEUROLOGIC: Mild distress due to acute illness SKIN:intact,warm,dry   LABS AND IMAGING     LAB RESULTS: Recent Labs  Lab 12/23/18 0815 12/09/2018 0940  NA 136 135  K 3.0* 3.6  CL 101 95*  CO2 22 17*  BUN 6* 50*  CREATININE 0.70 3.28*  GLUCOSE 107* 122*   Recent Labs  Lab 12/23/18 0815 12/02/2018 0940  HGB 10.3* 12.7  HCT 33.4* 40.6  WBC 11.1* 17.5*  PLT 334 395     IMAGING RESULTS: Ct Head Wo Contrast  Result Date: 12/22/2018 CLINICAL DATA:  Liver carcinoma with altered mental status/confusion EXAM: CT HEAD WITHOUT CONTRAST TECHNIQUE: Contiguous axial images were obtained from the base of the skull through the vertex without intravenous contrast. COMPARISON:  None. FINDINGS: Brain: Ventricles and sulci are within normal limits for age. There is no demonstrable intracranial mass, hemorrhage, extra-axial fluid collection, or midline shift. There is slight small vessel disease in the centra semiovale bilaterally. No acute infarct evident. Vascular: No hyperdense vessel. There are foci of calcification in each carotid siphon region. Skull: The bony calvarium appears intact. No neoplastic focus involving bony structures. Sinuses/Orbits: Visualized paranasal sinuses are clear. Visualized orbits appear symmetric bilaterally. Other: Mastoid air cells are clear. IMPRESSION: Mild periventricular small vessel disease. No acute infarct. No mass or hemorrhage. There are foci of arterial vascular calcification. Electronically Signed   By: Lowella Grip III M.D.   On: 12/03/2018 09:46   Dg Chest Port 1 View  Result Date: 12/23/2018 CLINICAL DATA:  73 year old female with history of confusion and disorientation after  initial doses of chemotherapy for liver cancer. EXAM: PORTABLE CHEST 1 VIEW COMPARISON:  No priors. FINDINGS: Lung volumes are low. Linear opacities in lung bases bilaterally favored to reflect areas of subsegmental atelectasis or scarring. No consolidative airspace disease. No pleural effusions. No evidence of pulmonary edema. Heart size is normal. The patient is rotated to the left on today's exam, resulting in distortion of the mediastinal contours and reduced diagnostic sensitivity and specificity for mediastinal pathology. Aortic atherosclerosis. IMPRESSION: 1. Low lung volumes with bibasilar areas of atelectasis and/or scarring (left greater than right). 2. Aortic atherosclerosis. Electronically Signed   By: Vinnie Langton M.D.   On: 12/14/2018 09:13      ASSESSMENT AND PLAN    -Multidisciplinary rounds held today  Altered mental status with lethargy  - likely due to septic encephalopathy with concomitant hepatic encephalopathy.   - noted elevation of venous ammonia levels, unclear significance in big picture but its being addressed with lactulose/rifaximin - fluid resuscitation and empiric IV antibiotics - on immunotherapy with Avastin and Tecentriq which can predispose to infection.  -Procalcitonin -TLS labs due to AKI    Hepatocellular carcinoma with portal vein throbosis - s/p Liver Bx - newly diagnosed -Followed by Dr. Tasia Catchings oncology-appreciate input -Status post  treatment with Tecentriq and Avastin     Acute Kidney Injury - KDIGO stage 3  - noted UA  - d/c non-essential nephrotoxic medications  - suspect partially due to transient hypotension  - renal US   - urine electrolytes      Probable sepsis -Blood culture x2 -will consider LP -on immunotherapy predisposing to infection 1 -Procalcitonin elevated >4, trending -use vasopressors to keep MAP>65 -follow ABG and LA -follow up cultures -emperic ABX- cefepime, vancomycin, rifaximin -consider stress dose steroids  -noted elevated lactate, trending    Transaminitis -Possibly due to transient hypotension versus HCC versus immunotherapy related -DC nonessential essential hepatotoxic medications and IV resuscitation    GI/Nutrition GI PROPHYLAXIS as indicated DIET-->TF's as tolerated Constipation protocol as indicated  ENDO - ICU hypoglycemic\Hyperglycemia protocol -check FSBS per protocol   ELECTROLYTES -follow labs as needed -replace as needed -pharmacy consultation   DVT/GI PRX ordered -SCDs  TRANSFUSIONS AS NEEDED MONITOR FSBS ASSESS the need for LABS as needed   Critical care provider statement:    Critical care time (minutes):  109   Critical care time was exclusive of:  Separately billable procedures and treating other patients   Critical care was necessary to treat or prevent imminent or life-threatening deterioration of the following conditions:   Acute mental status with lethargy, transaminitis, newly diagnosed hepatocellular carcinoma, extensive venous portal thrombosis, acute kidney injury stage III, probable sepsis, multiple comorbid conditions   Critical care was time spent personally by me on the following activities:  Development of treatment plan with patient or surrogate, discussions with consultants, evaluation of patient's response to treatment, examination of patient, obtaining history from patient or surrogate, ordering and performing treatments and interventions, ordering and review of laboratory studies and re-evaluation of patient's condition.  I assumed direction of critical care for this patient from another provider in my specialty: no    This document was prepared using Dragon voice recognition software and may include unintentional dictation errors.    Ottie Glazier, M.D.  Division of [place]

## 2018-12-27 NOTE — ED Notes (Signed)
ED TO INPATIENT HANDOFF REPORT  ED Nurse Name and Phone #:   S Name/Age/Gender [name] 73 y.o. female Room/Bed: ED18A/ED18A  Code Status   Code Status: Not on file  Home/SNF/Other Home Patient oriented to: self Is this baseline? No   Triage Complete: Triage complete  Chief Complaint AMS   Triage Note Per pt daughter, pt had her first chemo treatment for liver cancer last Thursday and states she has not been sleeping well, having diarrhea, not acting herself, states she showed signs of confusion during the night, pt is not responding in triage or following commands.   Allergies Allergies  Allergen Reactions  . Lisinopril Other (See Comments)    Other reaction(s): Cough    Level of Care/Admitting Diagnosis ED Disposition    ED Disposition Condition [facility] Area: [place] [[id number]]  Level of Care: Stepdown [14]  Covid Evaluation: Person Under Investigation (PUI)  Isolation Risk Level: Low Risk/Droplet (Less than 4L [place] supplementation)  Diagnosis: Sepsis [facility]) [[id number]]  Admitting Physician: Saundra Shelling [[id number]]  Attending Physician: Saundra Shelling [[id number]]  Estimated length of stay: past midnight tomorrow  Certification:: I certify this patient will need inpatient services for at least 2 midnights  PT Class (Do Not Modify): Inpatient [101]  PT Acc Code (Do Not Modify): Private [1]       B Medical/Surgery History Past Medical History:  Diagnosis Date  . Hypertension    not currently taking meds   Past Surgical History:  Procedure Laterality Date  . BREAST BIOPSY Left    neg  . CHOLECYSTECTOMY  1992     A IV Location/Drains/Wounds Patient Lines/Drains/Airways Status   Active Line/Drains/Airways    Name:   Placement date:   Placement time:   Site:   Days:   Peripheral IV 12/25/2018 Right;Anterior Forearm   12/25/2018    1025    Forearm   less than 1          Intake/Output Last 24  hours  Intake/Output Summary (Last 24 hours) at 12/18/2018 1213 Last data filed at 12/26/2018 1127 Gross per 24 hour  Intake 100 ml  Output -  Net 100 ml    Labs/Imaging Results for orders placed or performed during the hospital encounter of 12/28/2018 (from the past 48 hour(s))  Glucose, capillary     Status: Abnormal   Collection Time: 12/28/2018  8:17 AM  Result Value Ref Range   Glucose-Capillary 119 (H) 70 - 99 mg/dL  CBC with Differential     Status: Abnormal   Collection Time: 12/18/2018  9:40 AM  Result Value Ref Range   WBC 17.5 (H) 4.0 - 10.5 K/uL   RBC 5.37 (H) 3.87 - 5.11 MIL/uL   Hemoglobin 12.7 12.0 - 15.0 g/dL   HCT 40.6 36.0 - 46.0 %   MCV 75.6 (L) 80.0 - 100.0 fL   MCH 23.6 (L) 26.0 - 34.0 pg   MCHC 31.3 30.0 - 36.0 g/dL   RDW 20.7 (H) 11.5 - 15.5 %   Platelets 395 150 - 400 K/uL   nRBC 1.3 (H) 0.0 - 0.2 %   Neutrophils Relative % 84 %   Neutro Abs 14.5 (H) 1.7 - 7.7 K/uL   Lymphocytes Relative 4 %   Lymphs Abs 0.8 0.7 - 4.0 K/uL   Monocytes Relative 10 %   Monocytes Absolute 1.8 (H) 0.1 - 1.0 K/uL   Eosinophils Relative 0 %   Eosinophils Absolute 0.1 0.0 -  0.5 K/uL   Basophils Relative 1 %   Basophils Absolute 0.2 (H) 0.0 - 0.1 K/uL   WBC Morphology VACUOLATED NEUTROPHILS    Smear Review Normal platelet morphology    Immature Granulocytes 1 %   Abs Immature Granulocytes 0.24 (H) 0.00 - 0.07 K/uL   Schistocytes PRESENT     Comment: Performed at [facility], [address] [place], [place] [address]  Comprehensive metabolic panel     Status: Abnormal   Collection Time: 12/19/2018  9:40 AM  Result Value Ref Range   Sodium 135 135 - 145 mmol/L    Comment: LYTES REPEATED SNG   Potassium 3.6 3.5 - 5.1 mmol/L   Chloride 95 (L) 98 - 111 mmol/L   CO2 17 (L) 22 - 32 mmol/L   Glucose, Bld 122 (H) 70 - 99 mg/dL   BUN 50 (H) 8 - 23 mg/dL   Creatinine, Ser 3.28 (H) 0.44 - 1.00 mg/dL   Calcium 9.7 8.9 - 10.3 mg/dL   Total Protein 8.3 (H) 6.5 - 8.1 g/dL    Albumin 3.4 (L) 3.5 - 5.0 g/dL   AST 422 (H) 15 - 41 U/L   ALT 98 (H) 0 - 44 U/L   Alkaline Phosphatase 243 (H) 38 - 126 U/L   Total Bilirubin 2.8 (H) 0.3 - 1.2 mg/dL   GFR calc non Af Amer 13 (L) >60 mL/min   GFR calc Af Amer 16 (L) >60 mL/min   Anion gap 23 (H) 5 - 15    Comment: Performed at [facility], [address] [place], [place] [address]  Lactic acid, plasma     Status: Abnormal   Collection Time: 12/17/2018  9:40 AM  Result Value Ref Range   Lactic Acid, Venous 5.6 (HH) 0.5 - 1.9 mmol/L    Comment: CRITICAL RESULT CALLED TO, READ BACK BY AND VERIFIED WITH TERESA CLAPP AT 1033 12/14/2018 DAS Performed at [facility], [address] [place], [place] [address]   Ammonia     Status: Abnormal   Collection Time: 12/25/2018  9:40 AM  Result Value Ref Range   Ammonia 121 (H) 9 - 35 umol/L    Comment: Performed at [facility], [address] [place], [place] [address]  Magnesium     Status: Abnormal   Collection Time: 12/16/2018  9:40 AM  Result Value Ref Range   Magnesium 2.7 (H) 1.7 - 2.4 mg/dL    Comment: Performed at [facility], [address] [place], [place] [address]   Ct Head Wo Contrast  Result Date: 12/18/2018 CLINICAL DATA:  Liver carcinoma with altered mental status/confusion EXAM: CT HEAD WITHOUT CONTRAST TECHNIQUE: Contiguous axial images were obtained from the base of the skull through the vertex without intravenous contrast. COMPARISON:  None. FINDINGS: Brain: Ventricles and sulci are within normal limits for age. There is no demonstrable intracranial mass, hemorrhage, extra-axial fluid collection, or midline shift. There is slight small vessel disease in the centra semiovale bilaterally. No acute infarct evident. Vascular: No hyperdense vessel. There are foci of calcification in each carotid siphon region. Skull: The bony calvarium appears intact. No neoplastic focus involving bony structures. Sinuses/Orbits: Visualized  paranasal sinuses are clear. Visualized orbits appear symmetric bilaterally. Other: Mastoid air cells are clear. IMPRESSION: Mild periventricular small vessel disease. No acute infarct. No mass or hemorrhage. There are foci of arterial vascular calcification. Electronically Signed   By: Lowella Grip III M.D.   On: 12/05/2018 09:46  Dg Chest Port 1 View  Result Date: 11/29/2018 CLINICAL DATA:  73 year old female with history of confusion and disorientation after initial doses of chemotherapy for liver cancer. EXAM: PORTABLE CHEST 1 VIEW COMPARISON:  No priors. FINDINGS: Lung volumes are low. Linear opacities in lung bases bilaterally favored to reflect areas of subsegmental atelectasis or scarring. No consolidative airspace disease. No pleural effusions. No evidence of pulmonary edema. Heart size is normal. The patient is rotated to the left on today's exam, resulting in distortion of the mediastinal contours and reduced diagnostic sensitivity and specificity for mediastinal pathology. Aortic atherosclerosis. IMPRESSION: 1. Low lung volumes with bibasilar areas of atelectasis and/or scarring (left greater than right). 2. Aortic atherosclerosis. Electronically Signed   By: Vinnie Langton M.D.   On: 12/25/2018 09:13    Pending Labs Unresulted Labs (From admission, onward)    Start     Ordered   12/28/18 0500  Ammonia  Tomorrow morning,   STAT     11/30/2018 1209   11/29/2018 1123  Urinalysis, Complete w Microscopic  Once,   STAT     12/10/2018 1122   11/29/2018 1123  Urine culture  ONCE - STAT,   STAT     12/28/2018 1122   12/19/2018 0913  SARS Coronavirus 2 (CEPHEID - Performed in [place] hospital lab), [place]  (Asymptomatic Patients Labs)  Once,   STAT    Question:  Rule Out  Answer:  Yes   11/29/2018 0912   12/03/2018 0855  Lactic acid, plasma  Now then every 2 hours,   STAT     12/16/2018 0854   12/06/2018 0855  Blood culture (routine x 2)  BLOOD CULTURE X 2,   STAT     12/03/2018 0854   Signed and  Held  CBC  (enoxaparin (LOVENOX)    CrCl >/= 30 ml/min)  Once,   R    Comments: Baseline for enoxaparin therapy IF NOT ALREADY DRAWN.  Notify MD if PLT < 100 K.    Signed and Held   Signed and Held  Creatinine, serum  (enoxaparin (LOVENOX)    CrCl >/= 30 ml/min)  Once,   R    Comments: Baseline for enoxaparin therapy IF NOT ALREADY DRAWN.    Signed and Held   Signed and Held  Creatinine, serum  (enoxaparin (LOVENOX)    CrCl >/= 30 ml/min)  Weekly,   R    Comments: while on enoxaparin therapy    Signed and Held   Signed and Held  Comprehensive metabolic panel  Tomorrow morning,   R     Signed and Held   Signed and Held  CBC  Tomorrow morning,   R     Signed and Held          Vitals/Pain Today's Vitals   12/23/2018 0813 12/28/2018 0814 12/26/2018 1120  BP: 105/87  (!) 134/114  Pulse: 70  (!) 129  Resp: 18  (!) 30  Temp: 97.8 F (36.6 C)  98.9 F (37.2 C)  TempSrc: Axillary  Axillary  SpO2: 96%  96%  Weight:  77.2 kg   Height:  5\' 4"  (1.626 m)   PainSc:   0-No pain    Isolation Precautions No active isolations  Medications Medications  sodium chloride 0.9 % bolus 1,000 mL (has no administration in time range)    And  sodium chloride 0.9 % bolus 500 mL (has no administration in time range)  metroNIDAZOLE (FLAGYL) IVPB 500 mg (500 mg Intravenous  New Bag/Given 12/15/2018 1130)  vancomycin (VANCOCIN) IVPB 1000 mg/200 mL premix (1,000 mg Intravenous New Bag/Given 12/16/2018 1130)  lactulose (CHRONULAC) 10 GM/15ML solution 30 g (has no administration in time range)  0.9 %  sodium chloride infusion (1,000 mLs Intravenous New Bag/Given 12/16/2018 1050)  ceFEPIme (MAXIPIME) 2 g in sodium chloride 0.9 % 100 mL IVPB (0 g Intravenous Stopped 12/01/2018 1127)    Mobility non-ambulatory High fall risk   Focused Assessments Neuro Assessment Handoff:  Swallow screen pass? No          Neuro Assessment: Exceptions to WDL Neuro Checks:      Last Documented NIHSS Modified Score:   Has TPA  been given? No If patient is a Neuro Trauma and patient is going to OR before floor call report to [name] nurse: [phone] or [phone]     R Recommendations: See Admitting Provider Note  Report given to:   Additional Notes:

## 2018-12-27 NOTE — ED Notes (Signed)
Patient very irritable, non responsive, awake tossing and turning in bed but does not respond to name or commands. ABX infusing as ordered., daughter at bedside states she was fine last night taking and having normal  Conversations, then she  went to bed last night.  when daughter went in to check on her at 0200 in the morning she was acting confused, calling her by her sisters name. Has not been the same since then.

## 2018-12-27 NOTE — ED Provider Notes (Signed)
[facility] Emergency Department Provider Note   ____________________________________________    I have reviewed the triage vital signs and the nursing notes.   HISTORY  Chief Complaint Altered Mental Status  History limited by altered mental status, daughter providing history   HPI STORY CONTI is a 73 y.o. female who presents with altered mental status.  Daughter reports that patient treated with chemotherapy for hepatocellular carcinoma 3 days ago, had nausea diarrhea decreased p.o. intake over the weekend.  Early this morning daughter checked on patient and found her to be less responsive, this morning she had worsened.  No reports of fevers.  Past Medical History:  Diagnosis Date  . Hypertension    not currently taking meds    Patient Active Problem List   Diagnosis Date Noted  . Hypokalemia 12/23/2018  . Hepatocellular carcinoma ([place]) 12/16/2018  . Goals of care, counseling/discussion 11/27/2018  . Chronic venous insufficiency 03/18/2018  . 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 Osteoarthritis of knee 06/17/2017  . Nipple discharge 04/29/2013  . Essential hypertension 09/17/2012    Past Surgical History:  Procedure Laterality Date  . BREAST BIOPSY Left    neg  . CHOLECYSTECTOMY  1992    Prior to Admission medications   Medication Sig Start Date End Date Taking? Authorizing Provider  Ascorbic Acid (VITAMIN C) 1000 MG tablet Take by mouth.    [provider]  azelastine (ASTELIN) 0.1 % nasal spray PLACE 1 SPRAY INTO BOTH NOSTRILS 2 (TWO) TIMES DAILY 02/19/18   [provider]  Cholecalciferol (VITAMIN D) 1000 UNITS capsule Take 1,000 Units by mouth.    [provider]  COD LIVER OIL PO Take by mouth.    [provider]  dronabinol (MARINOL) 5 MG capsule Take 1 capsule (5 mg total) by mouth 2 (two) times daily before a  meal. Patient not taking: Reported on 12/23/2018 12/16/18   Earlie Server, MD  Flax OIL Take by mouth.    [provider]  loperamide (IMODIUM) 2 MG capsule Take 1 capsule (2 mg total) by mouth See admin instructions. With onset of loose stool, take 4mg  followed by 2mg  every 2 hours until 12 hours have passed without loose bowel movement. Maximum: 16 mg/day 12/24/18   Earlie Server, MD  magnesium 30 MG tablet Take 30 mg by mouth.    [provider]  Multiple Vitamin (MULTIVITAMIN) tablet Take 1 tablet by mouth.     [provider]  ondansetron (ZOFRAN) 8 MG tablet Take 1 tablet (8 mg total) by mouth 2 (two) times daily as needed (Nausea or vomiting). 12/16/18   Earlie Server, MD  pantoprazole (PROTONIX) 20 MG tablet Take 20 mg by mouth daily. 11/03/18   [provider]  potassium chloride (K-DUR) 10 MEQ tablet Take 1 tablet (10 mEq total) by mouth daily. 12/16/18   Earlie Server, MD  potassium gluconate (RA POTASSIUM GLUCONATE) 595 (99 K) MG TABS tablet Take by mouth.    [provider]  prochlorperazine (COMPAZINE) 10 MG tablet Take 1 tablet (10 mg total) by mouth every 6 (six) hours as needed (Nausea or vomiting). 12/16/18   Earlie Server, MD  traMADol (ULTRAM) 50 MG tablet Take 1 tablet (50 mg total) by mouth every 8 (eight) hours as needed. 11/25/18   Earlie Server, MD  vitamin B-12 (CYANOCOBALAMIN) 100 MCG tablet Take 50 mcg by mouth daily.  [provider]  VITAMIN E PO Take 1 tablet by mouth.     [provider]     Allergies Lisinopril  Family History  Problem Relation Age of Onset  . Alzheimer's disease Mother   . Stroke Father   . Breast cancer Daughter 31       [place], genetic test negative    Social History Social History   Tobacco Use  . Smoking status: Never Smoker  . Smokeless tobacco: Never Used  Substance Use Topics  . Alcohol use: No  . Drug use: No    Little 5 caveat: Unable to obtain review of Systems      ____________________________________________   PHYSICAL EXAM:  VITAL SIGNS: ED Triage Vitals  Enc Vitals Group     BP 11/29/2018 0813 105/87     Pulse Rate 12/22/2018 0813 70     Resp 12/13/2018 0813 18     Temp 12/25/2018 0813 97.8 F (36.6 C)     Temp Source 12/05/2018 0813 Axillary     SpO2 12/16/2018 0813 96 %     Weight 12/04/2018 0814 77.2 kg (170 lb 4.8 oz)     Height 12/28/2018 0814 1.626 m (5\' 4" )     Head Circumference --      Peak Flow --      Pain Score --      Pain Loc --      Pain Edu? --      Excl. in [place]? --     Constitutional: Opens eyes to stimulation, will whisper responses to questions but she appears confused Eyes: Conjunctivae are normal.  Head: Atraumatic. Nose: No congestion/rhinnorhea. Mouth/Throat: Mucous membranes are dry Neck:  Painless ROM Cardiovascular: Tachycardia, regular rhythm. Grossly normal heart sounds.  Good peripheral circulation. Respiratory: Normal respiratory effort.  No retractions. Lungs CTAB. Gastrointestinal: Soft and nontender. No distention.    Musculoskeletal:  Warm and well perfused Neurologic: Altered mental status, unable to participate in neurological exam, no eye deviation, PERRLA Skin:  Skin is warm, dry and intact. No rash noted.   ____________________________________________   LABS (all labs ordered are listed, but only abnormal results are displayed)  Labs Reviewed  GLUCOSE, CAPILLARY - Abnormal; Notable for the following components:      Result Value   Glucose-Capillary 119 (*)    All other components within normal limits  CBC WITH DIFFERENTIAL/PLATELET - Abnormal; Notable for the following components:   WBC 17.5 (*)    RBC 5.37 (*)    MCV 75.6 (*)    MCH 23.6 (*)    RDW 20.7 (*)    nRBC 1.3 (*)    Neutro Abs 14.5 (*)    Monocytes Absolute 1.8 (*)    Basophils Absolute 0.2 (*)    Abs Immature Granulocytes 0.24 (*)    All other components within normal limits  COMPREHENSIVE METABOLIC PANEL - Abnormal; Notable for  the following components:   Chloride 95 (*)    CO2 17 (*)    Glucose, Bld 122 (*)    BUN 50 (*)    Creatinine, Ser 3.28 (*)    Total Protein 8.3 (*)    Albumin 3.4 (*)    AST 422 (*)    ALT 98 (*)    Alkaline Phosphatase 243 (*)    Total Bilirubin 2.8 (*)    GFR calc non Af Amer 13 (*)    GFR calc Af Amer 16 (*)    Anion gap 23 (*)  All other components within normal limits  LACTIC ACID, PLASMA - Abnormal; Notable for the following components:   Lactic Acid, Venous 5.6 (*)    All other components within normal limits  AMMONIA - Abnormal; Notable for the following components:   Ammonia 121 (*)    All other components within normal limits  MAGNESIUM - Abnormal; Notable for the following components:   Magnesium 2.7 (*)    All other components within normal limits  CULTURE, BLOOD (ROUTINE X 2)  CULTURE, BLOOD (ROUTINE X 2)  SARS CORONAVIRUS 2 (HOSPITAL ORDER, [place] [facility])  URINE CULTURE  LACTIC ACID, PLASMA  URINALYSIS, COMPLETE (UACMP) WITH MICROSCOPIC   ____________________________________________  EKG ED ECG REPORT I, Lavonia Drafts, the attending physician, personally viewed and interpreted this ECG.  Date: 12/02/2018  Rhythm: Sinus tachycardia QRS Axis: normal Intervals: normal ST/T Wave abnormalities: normal Narrative Interpretation: no evidence of acute ischemia  ____________________________________________  RADIOLOGY  Chest x-ray unremarkable, reviewed by me CT head unremarkable ____________________________________________   PROCEDURES  Procedure(s) performed: No  Procedures   Critical Care performed: yes  CRITICAL CARE Performed by: Lavonia Drafts   Total critical care time: 30 minutes  Critical care time was exclusive of separately billable procedures and treating other patients.  Critical care was necessary to treat or prevent imminent or life-threatening deterioration.  Critical care was time spent personally  by me on the following activities: development of treatment plan with patient and/or surrogate as well as nursing, discussions with consultants, evaluation of patient's response to treatment, examination of patient, obtaining history from patient or surrogate, ordering and performing treatments and interventions, ordering and review of laboratory studies, ordering and review of radiographic studies, pulse oximetry and re-evaluation of patient's condition.  ____________________________________________   INITIAL IMPRESSION / ASSESSMENT AND PLAN / ED COURSE  Pertinent labs & imaging results that were available during my care of the patient were reviewed by me and considered in my medical decision making (see chart for details).  Patient presents with altered mental status 3 days status post chemotherapy for [facility], differential includes sepsis/infection although no fever or hypotension, metastases, dehydration.   ----------------------------------------- 11:57 AM on 12/10/2018 -----------------------------------------  Patient's lab work demonstrates significantly elevated white blood cell count, elevated lactic acid, given her tachycardia and altered mental status concern for sepsis, will cover broadly with IV antibiotics, 30 mils per kilogram IV fluids.  Ammonia is also markedly high, this may be contributing to her confusion.  She also is significantly dehydrated with acute renal failure creatinine is 3.28 with a BUN of 50.  Notified oncology of admission, will discuss with hospitalist    ____________________________________________   FINAL CLINICAL IMPRESSION(S) / ED DIAGNOSES  Final diagnoses:  Altered mental status, unspecified altered mental status type  Dehydration  Sepsis with acute renal failure, due to unspecified organism, unspecified acute renal failure type, unspecified whether septic shock present ([place])  Hyperammonemia ([place])        Note:  This document was prepared  using Dragon voice recognition software and may include unintentional dictation errors.   Lavonia Drafts, MD 12/03/2018 1159

## 2018-12-27 NOTE — Consult Note (Addendum)
Hematology/Oncology Consult note [facility] Telephone:([phone] [phone]  Patient Care Team: Sharyne Peach, MD as PCP - General (Family Medicine) Bary Castilla, Forest Gleason, MD (General Surgery) Sharyne Peach, MD (Family Medicine) Clent Jacks, RN as Registered Nurse   Name of the patient: Abigail Ayers  [id number]  1946-02-23   Date of visit: 12/18/2018 REASON FOR COSULTATION:  [place] History of presenting illness-  73 y.o. female with PMH listed including newly diagnosed [place] who underwent first cycle of immunotherapy and targeted therapy with Tecentriq and Avastin on 12/23/2018, who is currently admitted in the ICU. Daughter [name] called this morning reporting her mother will had multiple episodes of diarrhea, disorientated unresponsive. Advised her to call ambulance and sent patient to emergency room for immediate evaluation. Patient was found to have altered mental status, elevated at lactic acid, dehydrated. Ammonia level was also elevated.  Patient was found to have acute kidney failure with a creatinine of 3.2, with her baseline of 0.7. Patient is currently admitted ICU.  Daughter [name] at bedside. Patient cannot provide any history due to altered mental status.  CT head showed a mild peri-ventricular small vessel disease.  No acute infarct.  No mass or hemorrhage.  Review of Systems  Unable to perform ROS: Mental status change    Allergies  Allergen Reactions  . Lisinopril Other (See Comments)    Other reaction(s): Cough    Patient Active Problem List   Diagnosis Date Noted  . Sepsis ([place]) 12/11/2018  . Hypokalemia 12/23/2018  . Hepatocellular carcinoma ([place]) 12/16/2018  . Goals of care, counseling/discussion 11/27/2018  . Chronic venous insufficiency 03/18/2018  . 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 Osteoarthritis of knee 06/17/2017  . Nipple  discharge 04/29/2013  . Essential hypertension 09/17/2012     Past Medical History:  Diagnosis Date  . Hypertension    not currently taking meds     Past Surgical History:  Procedure Laterality Date  . BREAST BIOPSY Left    neg  . CHOLECYSTECTOMY  1992    Social History   Socioeconomic History  . Marital status: Widowed    Spouse name: Not on file  . Number of children: Not on file  . Years of education: Not on file  . Highest education level: Not on file  Occupational History  . Not on file  Social Needs  . Financial resource strain: Not on file  . Food insecurity    Worry: Not on file    Inability: Not on file  . Transportation needs    Medical: Not on file    Non-medical: Not on file  Tobacco Use  . Smoking status: Never Smoker  . Smokeless tobacco: Never Used  Substance and Sexual Activity  . Alcohol use: No  . Drug use: No  . Sexual activity: Not on file  Lifestyle  . Physical activity    Days per week: Not on file    Minutes per session: Not on file  . Stress: Not on file  Relationships  . Social Herbalist on phone: Not on file    Gets together: Not on file    Attends religious service: Not on file    Active member of club or organization: Not on file    Attends meetings of clubs or organizations: Not on file    Relationship status: Not on file  . Intimate partner violence  Fear of current or ex partner: Not on file    Emotionally abused: Not on file    Physically abused: Not on file    Forced sexual activity: Not on file  Other Topics Concern  . Not on file  Social History Narrative  . Not on file     Family History  Problem Relation Age of Onset  . Alzheimer's disease Mother   . Stroke Father   . Breast cancer Daughter 18       [place], genetic test negative     Current Facility-Administered Medications:  .  acetaminophen (TYLENOL) tablet 650 mg, 650 mg, Oral, Q6H PRN **OR** acetaminophen (TYLENOL) suppository 650 mg,  650 mg, Rectal, Q6H PRN, Pyreddy, Pavan, MD .  famotidine (PEPCID) IVPB 20 mg premix, 20 mg, Intravenous, QHS, Charlett Nose, RPH .  heparin injection 5,000 Units, 5,000 Units, Subcutaneous, Q8H, Charlett Nose, RPH .  lactated ringers infusion, , Intravenous, Continuous, Ottie Glazier, MD, Last Rate: 100 mL/hr at 12/13/2018 1604 .  lactulose (CHRONULAC) enema 200 gm, 300 mL, Rectal, BID, Aleskerov, Fuad, MD .  ondansetron (ZOFRAN) tablet 4 mg, 4 mg, Oral, Q6H PRN **OR** ondansetron (ZOFRAN) injection 4 mg, 4 mg, Intravenous, Q6H PRN, Pyreddy, Pavan, MD .  piperacillin-tazobactam (ZOSYN) IVPB 3.375 g, 3.375 g, Intravenous, Q12H, Nazari, Walid A, RPH .  rifaximin (XIFAXAN) tablet 550 mg, 550 mg, Oral, BID, Tahiliani, Varnita B, MD .  vancomycin variable dose per unstable renal function (pharmacist dosing), , Does not apply, See admin instructions, Pearla Dubonnet [facility]   Physical exam: Vitals:   12/07/2018 1120 12/02/2018 1239 12/08/2018 1400 12/10/2018 1500  BP: (!) 134/114 (!) 128/96 130/80 129/77  Pulse: (!) 129 (!) 125 (!) 118 (!) 119  Resp: (!) 30 (!) 26 (!) 32 (!) 35  Temp: 98.9 F (37.2 C)  98 F (36.7 C) 98.3 F (36.8 C)  TempSrc: Axillary     SpO2: 96% 97% 100% 96%  Weight:   162 lb 11.2 oz (73.8 kg)   Height:   5' 0.5" (1.537 m)    Physical Exam  HENT:  Head: Normocephalic and atraumatic.  Cardiovascular:  Tachycardia  Pulmonary/Chest: No respiratory distress.  Tachypneic  Neurological:  Unresponsive.  Not answering any questions or following commands.  Skin: Skin is warm.        CMP Latest Ref Rng & Units 12/14/2018  Glucose 70 - 99 mg/dL 122(H)  BUN 8 - 23 mg/dL 50(H)  Creatinine 0.44 - 1.00 mg/dL 3.28(H)  Sodium 135 - 145 mmol/L 135  Potassium 3.5 - 5.1 mmol/L 3.6  Chloride 98 - 111 mmol/L 95(L)  CO2 22 - 32 mmol/L 17(L)  Calcium 8.9 - 10.3 mg/dL 9.7  Total Protein 6.5 - 8.1 g/dL 8.3(H)  Total Bilirubin 0.3 - 1.2 mg/dL 2.8(H)  Alkaline Phos 38 - 126 U/L  243(H)  AST 15 - 41 U/L 422(H)  ALT 0 - 44 U/L 98(H)   CBC Latest Ref Rng & Units 12/08/2018  WBC 4.0 - 10.5 K/uL 17.5(H)  Hemoglobin 12.0 - 15.0 g/dL 12.7  Hematocrit 36.0 - 46.0 % 40.6  Platelets 150 - 400 K/uL 395   RADIOGRAPHIC STUDIES: I have personally reviewed the radiological images as listed and agreed with the findings in the report. Ct Head Wo Contrast  Result Date: 12/05/2018 CLINICAL DATA:  Liver carcinoma with altered mental status/confusion EXAM: CT HEAD WITHOUT CONTRAST TECHNIQUE: Contiguous axial images were obtained from the base of the skull through the vertex without  intravenous contrast. COMPARISON:  None. FINDINGS: Brain: Ventricles and sulci are within normal limits for age. There is no demonstrable intracranial mass, hemorrhage, extra-axial fluid collection, or midline shift. There is slight small vessel disease in the centra semiovale bilaterally. No acute infarct evident. Vascular: No hyperdense vessel. There are foci of calcification in each carotid siphon region. Skull: The bony calvarium appears intact. No neoplastic focus involving bony structures. Sinuses/Orbits: Visualized paranasal sinuses are clear. Visualized orbits appear symmetric bilaterally. Other: Mastoid air cells are clear. IMPRESSION: Mild periventricular small vessel disease. No acute infarct. No mass or hemorrhage. There are foci of arterial vascular calcification. Electronically Signed   By: Lowella Grip III M.D.   On: 12/26/2018 09:46   Ct Chest Wo Contrast  Result Date: 12/15/2018 CLINICAL DATA:  Hepatocellular carcinoma staging. Chest pain. Portal vein thrombosis. EXAM: CT CHEST WITHOUT CONTRAST TECHNIQUE: Multidetector CT imaging of the chest was performed following the standard protocol without IV contrast. COMPARISON:  Abdominal CT 11/05/2018.  No prior chest imaging. FINDINGS: Cardiovascular: Aortic atherosclerosis. Tortuous thoracic aorta. Borderline cardiomegaly. Multivessel coronary artery  atherosclerosis. Mediastinum/Nodes: No supraclavicular adenopathy. No mediastinal or definite hilar adenopathy, given limitations of unenhanced CT. Lungs/Pleura: Trace right pleural fluid. No suspicious pulmonary nodule or mass. Areas of bilateral major fissure thickening versus small subpleural lymph nodes. A right apical subpleural 3 mm nodule on image 29/3 is also most likely a subpleural lymph node. Upper Abdomen: Cirrhosis with heterogeneity throughout the liver, better evaluated on contrast-enhanced studies. New small volume ascites since 11/05/2018. Musculoskeletal: Thoracic spondylosis. IMPRESSION: 1.  No acute process or evidence of metastatic disease in the chest. 2. Coronary artery atherosclerosis. Aortic Atherosclerosis (ICD10-I70.0). 3. Trace right pleural fluid. 4. New small volume ascites. Electronically Signed   By: Abigail Miyamoto M.D.   On: 12/15/2018 08:51   US Biopsy (liver)  Result Date: 12/06/2018 INDICATION: 73 year old female with a history liver mass EXAM: ULTRASOUND-GUIDED BIOPSY LIVER MASS MEDICATIONS: None. ANESTHESIA/SEDATION: Moderate (conscious) sedation was employed during this procedure. A total of Versed 1.5 mg and Fentanyl 75 mcg was administered intravenously. Moderate Sedation Time: 25 minutes. The patient's level of consciousness and vital signs were monitored continuously by radiology nursing throughout the procedure under my direct supervision. FLUOROSCOPY TIME:  ULTRASOUND COMPLICATIONS: NONE PROCEDURE: Informed written consent was obtained from the patient after a thorough discussion of the procedural risks, benefits and alternatives. All questions were addressed. Maximal Sterile Barrier Technique was utilized including caps, mask, sterile gowns, sterile gloves, sterile drape, hand hygiene and skin antiseptic. A timeout was performed prior to the initiation of the procedure Ultrasound survey of the right liver lobe performed with images stored and sent to PACs. The right  lower thorax/right upper abdomen was prepped with chlorhexidine in a sterile fashion, and a sterile drape was applied covering the operative field. A sterile gown and sterile gloves were used for the procedure. Local anesthesia was provided with 1% Lidocaine. Once the patient is prepped and draped sterilely and the skin and subcutaneous tissues were generously infiltrated with 1% lidocaine, a small stab incision was made with an 11 blade scalpel. A 17 gauge introducer needle was advanced under ultrasound guidance in an intercostal location into the right liver lobe, targeting ill-defined parenchyma of the liver tissue of the right liver. The stylet was removed, and multiple separate 18 gauge core biopsy were retrieved. Samples were placed into formalin for transportation to the lab. Gel-Foam slurry was then infused with a small amount of saline for assistance with hemostasis.  The needle was removed, and a final ultrasound image was performed. The patient tolerated the procedure well and remained hemodynamically stable throughout. No complications were encountered and no significant blood loss was encounter. IMPRESSION: Status post ultrasound-guided biopsy of ill-defined mass of the right liver. Signed, Dulcy Fanny. Dellia Nims, RPVI [facility] Electronically Signed   By: Corrie Mckusick D.O.   On: 12/06/2018 10:17   Dg Chest Port 1 View  Result Date: 12/14/2018 CLINICAL DATA:  73 year old female with history of confusion and disorientation after initial doses of chemotherapy for liver cancer. EXAM: PORTABLE CHEST 1 VIEW COMPARISON:  No priors. FINDINGS: Lung volumes are low. Linear opacities in lung bases bilaterally favored to reflect areas of subsegmental atelectasis or scarring. No consolidative airspace disease. No pleural effusions. No evidence of pulmonary edema. Heart size is normal. The patient is rotated to the left on today's exam, resulting in  distortion of the mediastinal contours and reduced diagnostic sensitivity and specificity for mediastinal pathology. Aortic atherosclerosis. IMPRESSION: 1. Low lung volumes with bibasilar areas of atelectasis and/or scarring (left greater than right). 2. Aortic atherosclerosis. Electronically Signed   By: Vinnie Langton M.D.   On: 12/08/2018 09:13    Assessment and plan- Patient is a 73 y.o. female with newly diagnosed [place], just started on first cycle of Tecentriq plus Avastin currently presents with altered mental status.  #Altered mental status likely multifactorial due to hepatic encephalopathy as well as sepsis/dehydration.   #Sepsis, lactic acidosis, elevated procalcitonin, blood cultures pending. COVID 19 negative.  Continue broad-spectrum antibiotics. # AKI, due to dehydration. Continue IV fluid.  #Diarrhea, obtain stool analysis for C. difficile and GI virus #HCC, status post cycle 1 Tecentriq and Avastin. Worsening AST ALT and bilirubin.  Likely due to progression of underlying disease versus due to acute illness. Obtain US. GI recommendation noted.  # CBC was reviewed. Leukocytosis due to acute issue. Normal hemoglobin and platelet counts likely due to hemoconcentration. Obtain peripheral smear.   # Goal of care: I discussed with daughter [name] who is at the bedside and also did a conference call with patient's all other 3 children and update them about patient's cancer diagnosis, which is unfortunately a very aggressive type of cancer, noncurable, poor prognosis.  Continue current scope of care, recommend to see how she does over the next 24 to 48 hours.  If condition continues to deteriorate, hospice is reasonable.  CODE STATUS was discussed with [name] and she will discuss with all her siblings. Consult palliative care.   Thank you for allowing me to participate in the care of this patient.  Total face to face encounter time for this patient visit was 70 min. >50% of the time was   spent in counseling and coordination of care.    Earlie Server, MD, PhD Hematology Oncology [facility] Pager- [phone] 12/26/2018

## 2018-12-27 NOTE — Progress Notes (Signed)
CODE SEPSIS - PHARMACY COMMUNICATION  **Broad Spectrum Antibiotics should be administered within 1 hour of Sepsis diagnosis**  Time Code Sepsis Called/Page Received: 1036  Antibiotics Ordered: Cefepime/Vancomycin   Time of 1st antibiotic administration: Cefepime 1051   Additional action taken by pharmacy: none indicated   If necessary, Name of Provider/Nurse Contacted: N/A    Oneal Biglow L ,PharmD Clinical Pharmacist  12/18/2018  11:12 AM

## 2018-12-27 NOTE — ED Notes (Signed)
Lab called to state that there was not enough urine to complete the UA and that it would need to be recollected - this was reported to [facility] the oncoming nurse

## 2018-12-27 NOTE — ED Triage Notes (Signed)
Per pt daughter, pt had her first chemo treatment for liver cancer last Thursday and states she has not been sleeping well, having diarrhea, not acting herself, states she showed signs of confusion during the night, pt is not responding in triage or following commands.

## 2018-12-27 NOTE — Sepsis Progress Note (Signed)
Notified bedside nurse of need to draw lactic acid.  

## 2018-12-27 NOTE — ED Notes (Signed)
Discussed pt with Dr Corky Downs - called lab to come and obtain labs prior to IV team arrival

## 2018-12-27 NOTE — H&P (Signed)
[facility] NAME: Abigail Ayers    MR#:  [id number]  DATE OF BIRTH:  October 16, 1945  DATE OF ADMISSION:  12/24/2018  PRIMARY CARE PHYSICIAN: Sharyne Peach, MD   REQUESTING/REFERRING PHYSICIAN:   CHIEF COMPLAINT:   Chief Complaint  Patient presents with  . Altered Mental Status    HISTORY OF PRESENT ILLNESS: Abigail Ayers  is a 73 y.o. female with a known history of hypertension, hepatocellular carcinoma presented to the emergency room for confusion  Patient is altered.  Unable to give any history.  Daughter was at bedside.  She was worked up in the emergency room lactic acid is elevated appears dry and dehydrated.  Patient was worked up with CT head which showed no acute abnormality.  Ammonia level is elevated.  PAST MEDICAL HISTORY:   Past Medical History:  Diagnosis Date  . Hypertension    not currently taking meds    PAST SURGICAL HISTORY:  Past Surgical History:  Procedure Laterality Date  . BREAST BIOPSY Left    neg  . CHOLECYSTECTOMY  1992    SOCIAL HISTORY:  Social History   Tobacco Use  . Smoking status: Never Smoker  . Smokeless tobacco: Never Used  Substance Use Topics  . Alcohol use: No    FAMILY HISTORY:  Family History  Problem Relation Age of Onset  . Alzheimer's disease Mother   . Stroke Father   . Breast cancer Daughter 30       [place], genetic test negative    DRUG ALLERGIES:  Allergies  Allergen Reactions  . Lisinopril Other (See Comments)    Other reaction(s): Cough    REVIEW OF SYSTEMS:  Could not be obtained as patient is altered  MEDICATIONS AT HOME:  Prior to Admission medications   Medication Sig Start Date End Date Taking? Authorizing Provider  Cholecalciferol (VITAMIN D) 1000 UNITS capsule Take 1,000 Units by mouth.   Yes [provider]  dronabinol (MARINOL) 5 MG capsule Take 1 capsule (5 mg total) by mouth 2 (two) times daily before a meal. 12/16/18  Yes  Earlie Server, MD  loperamide (IMODIUM) 2 MG capsule Take 1 capsule (2 mg total) by mouth See admin instructions. With onset of loose stool, take 4mg  followed by 2mg  every 2 hours until 12 hours have passed without loose bowel movement. Maximum: 16 mg/day 12/24/18  Yes Earlie Server, MD  Multiple Vitamin (MULTIVITAMIN) tablet Take 1 tablet by mouth.    Yes [provider]  ondansetron (ZOFRAN) 8 MG tablet Take 1 tablet (8 mg total) by mouth 2 (two) times daily as needed (Nausea or vomiting). 12/16/18  Yes Earlie Server, MD  potassium chloride (K-DUR) 10 MEQ tablet Take 1 tablet (10 mEq total) by mouth daily. 12/16/18  Yes Earlie Server, MD  prochlorperazine (COMPAZINE) 10 MG tablet Take 1 tablet (10 mg total) by mouth every 6 (six) hours as needed (Nausea or vomiting). 12/16/18  Yes Earlie Server, MD  traMADol (ULTRAM) 50 MG tablet Take 1 tablet (50 mg total) by mouth every 8 (eight) hours as needed. 11/25/18  Yes Earlie Server, MD  vitamin B-12 (CYANOCOBALAMIN) 100 MCG tablet Take 50 mcg by mouth daily.   Yes [provider]      PHYSICAL EXAMINATION:   VITAL SIGNS: Blood pressure (!) 128/96, pulse (!) 125, temperature 98.9 F (37.2 C), temperature source Axillary, resp. rate (!) 26, height 5\' 4"  (1.626 m), weight 77.2 kg, SpO2 97 %.  GENERAL:  73 y.o.-year-old patient lying in the bed  EYES: Pupils equal, round, reactive to light and accommodation, no icterus. Extraocular muscles intact.  HEENT: Head atraumatic, normocephalic. Oropharynx and nasopharynx clear.  NECK:  Supple, no jugular venous distention. No thyroid enlargement, no tenderness.  LUNGS: Normal breath sounds bilaterally, no wheezing, rales,rhonchi or crepitation. No use of accessory muscles of respiration.  CARDIOVASCULAR: S1, S2 tachycardia noted. No murmurs, rubs, or gallops.  ABDOMEN: Soft, nontender, nondistended. Bowel sounds present. No organomegaly or mass.  EXTREMITIES: No pedal edema, cyanosis, or clubbing.  NEUROLOGIC: Patient is not  oriented to time place and person Moves all extremities PSYCHIATRIC: The patient is alert and oriented x none SKIN: No obvious rash, lesion, or ulcer.   LABORATORY PANEL:   CBC Recent Labs  Lab 12/23/18 0815 12/18/2018 0940  WBC 11.1* 17.5*  HGB 10.3* 12.7  HCT 33.4* 40.6  PLT 334 395  MCV 76.8* 75.6*  MCH 23.7* 23.6*  MCHC 30.8 31.3  RDW 19.3* 20.7*  LYMPHSABS 1.3 0.8  MONOABS 1.3* 1.8*  EOSABS 0.1 0.1  BASOSABS 0.1 0.2*   ------------------------------------------------------------------------------------------------------------------  Chemistries  Recent Labs  Lab 12/23/18 0815 12/23/18 0817 11/29/2018 0940  NA 136  --  135  K 3.0*  --  3.6  CL 101  --  95*  CO2 22  --  17*  GLUCOSE 107*  --  122*  BUN 6*  --  50*  CREATININE 0.70  --  3.28*  CALCIUM 9.2  --  9.7  MG  --  1.9 2.7*  AST 87*  --  422*  ALT 31  --  98*  ALKPHOS 236*  --  243*  BILITOT 2.0*  --  2.8*   ------------------------------------------------------------------------------------------------------------------ estimated creatinine clearance is 15.8 mL/min (A) (by C-G formula based on SCr of 3.28 mg/dL (H)). ------------------------------------------------------------------------------------------------------------------ No results for input(s): TSH, T4TOTAL, T3FREE, THYROIDAB in the last 72 hours.  Invalid input(s): FREET3   Coagulation profile No results for input(s): INR, PROTIME in the last 168 hours. ------------------------------------------------------------------------------------------------------------------- No results for input(s): DDIMER in the last 72 hours. -------------------------------------------------------------------------------------------------------------------  Cardiac Enzymes No results for input(s): CKMB, TROPONINI, MYOGLOBIN in the last 168 hours.  Invalid input(s):  CK ------------------------------------------------------------------------------------------------------------------ Invalid input(s): POCBNP  ---------------------------------------------------------------------------------------------------------------  Urinalysis    Component Value Date/Time   COLORURINE AMBER (A) 11/25/2018 1521   APPEARANCEUR HAZY (A) 11/25/2018 1521   LABSPEC 1.026 11/25/2018 1521   PHURINE 5.0 11/25/2018 1521   GLUCOSEU NEGATIVE 11/25/2018 1521   HGBUR NEGATIVE 11/25/2018 1521   BILIRUBINUR SMALL (A) 11/25/2018 1521   KETONESUR 5 (A) 11/25/2018 1521   PROTEINUR 100 (A) 11/25/2018 1521   NITRITE NEGATIVE 11/25/2018 1521   LEUKOCYTESUR SMALL (A) 11/25/2018 1521     RADIOLOGY: Ct Head Wo Contrast  Result Date: 12/19/2018 CLINICAL DATA:  Liver carcinoma with altered mental status/confusion EXAM: CT HEAD WITHOUT CONTRAST TECHNIQUE: Contiguous axial images were obtained from the base of the skull through the vertex without intravenous contrast. COMPARISON:  None. FINDINGS: Brain: Ventricles and sulci are within normal limits for age. There is no demonstrable intracranial mass, hemorrhage, extra-axial fluid collection, or midline shift. There is slight small vessel disease in the centra semiovale bilaterally. No acute infarct evident. Vascular: No hyperdense vessel. There are foci of calcification in each carotid siphon region. Skull: The bony calvarium appears intact. No neoplastic focus involving bony structures. Sinuses/Orbits: Visualized paranasal sinuses are clear. Visualized orbits appear symmetric bilaterally. Other: Mastoid air cells are clear. IMPRESSION: Mild periventricular  small vessel disease. No acute infarct. No mass or hemorrhage. There are foci of arterial vascular calcification. Electronically Signed   By: Lowella Grip III M.D.   On: 11/30/2018 09:46   Dg Chest Port 1 View  Result Date: 12/09/2018 CLINICAL DATA:  73 year old female with history  of confusion and disorientation after initial doses of chemotherapy for liver cancer. EXAM: PORTABLE CHEST 1 VIEW COMPARISON:  No priors. FINDINGS: Lung volumes are low. Linear opacities in lung bases bilaterally favored to reflect areas of subsegmental atelectasis or scarring. No consolidative airspace disease. No pleural effusions. No evidence of pulmonary edema. Heart size is normal. The patient is rotated to the left on today's exam, resulting in distortion of the mediastinal contours and reduced diagnostic sensitivity and specificity for mediastinal pathology. Aortic atherosclerosis. IMPRESSION: 1. Low lung volumes with bibasilar areas of atelectasis and/or scarring (left greater than right). 2. Aortic atherosclerosis. Electronically Signed   By: Vinnie Langton M.D.   On: 12/13/2018 09:13    EKG: Orders placed or performed during the hospital encounter of 12/14/2018  . ED EKG  . ED EKG    IMPRESSION AND PLAN: 73 year old female with a known history of hypertension, hepatocellular carcinoma presented to the emergency room for confusion  Patient is altered.   -Acute hepatic encephalopathy Admit patient to stepdown unit Lactulose 3 times daily Intensivist and gastroenterology evaluation Monitor ammonia level N.p.o. for now  -Sepsis Unknown etiology Follow-up cultures and lactic acid level IV fluids Broad-spectrum antibiotics  -Hepatocellular carcinoma Oncology follow-up  -Dehydration IV fluids  -Abnormal LFT secondary to hepatocellular carcinoma  -Acute kidney injury IV fluid hydration Follow-up renal function  -DVT prophylaxis subcu Lovenox daily All the records are reviewed and case discussed with ED provider. Management plans discussed with the patient, family and they are in agreement.  CODE STATUS:Full code Advance Directive Documentation     Most Recent Value  Type of Advance Directive  Healthcare Power of Attorney, Living will  Pre-existing out of facility DNR  order (yellow form or pink MOST form)  -  "MOST" Form in Place?  -       TOTAL TIME TAKING CARE OF THIS PATIENT: 52 minutes.    Saundra Shelling M.D on 12/24/2018 at 12:48 PM  Between 7am to 6pm - Pager - [phone]  After 6pm go to [email or web address] - password [email or web address] [facility]  Office  [phone]  CC: Primary care physician; Sharyne Peach, MD

## 2018-12-27 NOTE — Consult Note (Addendum)
Pharmacy Antibiotic Note  Abigail Ayers is a 73 y.o. female admitted on 12/25/2018 with sepsis.  Pharmacy has been consulted for Cefepime and Vancomycin dosing. Patient currently suffering from AKI(baseline<1). Doses have been adjusted accordingly.  Plan: 1) Patient was given Vancomycin 1g IV x 1 dose in the ED. An additional dose of 500mg  IV was ordered to give her a total of 1500mg . Will obtain random levels daily and Scr and order doses as deemed appropriate.  2) Cefepime 1g Q12 hours   Height: 5\' 4"  (162.6 cm) Weight: 170 lb 4.8 oz (77.2 kg) IBW/kg (Calculated) : 54.7  Temp (24hrs), Avg:98.4 F (36.9 C), Min:97.8 F (36.6 C), Max:98.9 F (37.2 C)  Recent Labs  Lab 12/23/18 0815 12/18/2018 0940  WBC 11.1* 17.5*  CREATININE 0.70 3.28*  LATICACIDVEN  --  5.6*    Estimated Creatinine Clearance: 15.8 mL/min (A) (by C-G formula based on SCr of 3.28 mg/dL (H)).    Allergies  Allergen Reactions  . Lisinopril Other (See Comments)    Other reaction(s): Cough    Antimicrobials this admission: Vancomycin 6/29 >>   Cefepime 6/29 >> Flagyl 6/29 x 1   Microbiology results: 6/29 BCx: pending 6/29 UCx: pending    Thank you for allowing pharmacy to be a part of this patient's care.  Sheza Strickland A Ketina Mars 12/17/2018 1:46 PM

## 2018-12-27 NOTE — Progress Notes (Signed)
PHARMACY -  BRIEF ANTIBIOTIC NOTE   Pharmacy has received consult(s) for Vancomycin and Cefepime from an ED provider.  The patient's profile has been reviewed for ht/wt/allergies/indication/available labs.    One time order(s) have already been placed for Vancomycin 1 gram and Cefepime 2 gram  Further antibiotics/pharmacy consults should be ordered by admitting physician if indicated.                       Thank you, Starkeisha Vanwinkle A 12/01/2018  10:46 AM

## 2018-12-28 DIAGNOSIS — Z515 Encounter for palliative care: Secondary | ICD-10-CM

## 2018-12-28 LAB — COMPREHENSIVE METABOLIC PANEL
ALT: 103 U/L — ABNORMAL HIGH (ref 0–44)
AST: 419 U/L — ABNORMAL HIGH (ref 15–41)
Albumin: 2.9 g/dL — ABNORMAL LOW (ref 3.5–5.0)
Alkaline Phosphatase: 222 U/L — ABNORMAL HIGH (ref 38–126)
Anion gap: 14 (ref 5–15)
BUN: 44 mg/dL — ABNORMAL HIGH (ref 8–23)
CO2: 21 mmol/L — ABNORMAL LOW (ref 22–32)
Calcium: 8.6 mg/dL — ABNORMAL LOW (ref 8.9–10.3)
Chloride: 105 mmol/L (ref 98–111)
Creatinine, Ser: 1.59 mg/dL — ABNORMAL HIGH (ref 0.44–1.00)
GFR calc Af Amer: 37 mL/min — ABNORMAL LOW (ref >60)
GFR calc non Af Amer: 32 mL/min — ABNORMAL LOW (ref >60)
Glucose, Bld: 116 mg/dL — ABNORMAL HIGH (ref 70–99)
Potassium: 3.7 mmol/L (ref 3.5–5.1)
Sodium: 140 mmol/L (ref 135–145)
Total Bilirubin: 2.8 mg/dL — ABNORMAL HIGH (ref 0.3–1.2)
Total Protein: 6.9 g/dL (ref 6.5–8.1)

## 2018-12-28 LAB — AMMONIA: Ammonia: 44 umol/L — ABNORMAL HIGH (ref 9–35)

## 2018-12-28 LAB — CBC
HCT: 38.4 % (ref 36.0–46.0)
Hemoglobin: 11.9 g/dL — ABNORMAL LOW (ref 12.0–15.0)
MCH: 23.7 pg — ABNORMAL LOW (ref 26.0–34.0)
MCHC: 31 g/dL (ref 30.0–36.0)
MCV: 76.5 fL — ABNORMAL LOW (ref 80.0–100.0)
Platelets: 311 10*3/uL (ref 150–400)
RBC: 5.02 MIL/uL (ref 3.87–5.11)
RDW: 21 % — ABNORMAL HIGH (ref 11.5–15.5)
WBC: 16.6 10*3/uL — ABNORMAL HIGH (ref 4.0–10.5)
nRBC: 2 % — ABNORMAL HIGH (ref 0.0–0.2)

## 2018-12-28 LAB — FERRITIN: Ferritin: 2930 ng/mL — ABNORMAL HIGH (ref 11–307)

## 2018-12-28 LAB — URINE CULTURE: Culture: NO GROWTH

## 2018-12-28 LAB — BILIRUBIN, DIRECT: Bilirubin, Direct: 1.6 mg/dL — ABNORMAL HIGH (ref 0.0–0.2)

## 2018-12-28 LAB — MAGNESIUM: Magnesium: 2.5 mg/dL — ABNORMAL HIGH (ref 1.7–2.4)

## 2018-12-28 LAB — VANCOMYCIN, RANDOM: Vancomycin Rm: 16

## 2018-12-28 LAB — PATHOLOGIST SMEAR REVIEW

## 2018-12-28 MED ORDER — VANCOMYCIN HCL IN DEXTROSE 1-5 GM/200ML-% IV SOLN
1000.0000 mg | Freq: Once | INTRAVENOUS | Status: AC
  Administered 2018-12-28: 1000 mg via INTRAVENOUS
  Filled 2018-12-28: qty 200

## 2018-12-28 MED ORDER — SODIUM CHLORIDE 0.9 % IV SOLN
6.0000 mg | Freq: Once | INTRAVENOUS | Status: AC
  Administered 2018-12-28: 6 mg via INTRAVENOUS
  Filled 2018-12-28: qty 4

## 2018-12-28 MED ORDER — MORPHINE SULFATE (PF) 2 MG/ML IV SOLN
INTRAVENOUS | Status: AC
  Administered 2018-12-28: 23:00:00 1 mg via INTRAVENOUS
  Filled 2018-12-28: qty 1

## 2018-12-28 MED ORDER — MORPHINE SULFATE (PF) 2 MG/ML IV SOLN
1.0000 mg | INTRAVENOUS | Status: DC | PRN
  Administered 2018-12-28 – 2018-12-29 (×3): 1 mg via INTRAVENOUS
  Filled 2018-12-28 (×3): qty 1

## 2018-12-28 MED ORDER — SODIUM CHLORIDE 0.9 % IV SOLN
100.0000 mg | Freq: Every day | INTRAVENOUS | Status: DC
  Filled 2018-12-28 (×2): qty 100

## 2018-12-28 NOTE — Progress Notes (Addendum)
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   Plan for Palliative care evaluation today.   Had meeting with son [name] today, discussed care plan and answered questions.   PAST MEDICAL HISTORY   Past Medical History:  Diagnosis Date   Hypertension    not currently taking meds     SURGICAL HISTORY   Past Surgical History:  Procedure Laterality Date   BREAST BIOPSY Left    neg   CHOLECYSTECTOMY  1992     FAMILY HISTORY   Family History  Problem Relation Age of Onset   Alzheimer's disease Mother    Stroke Father    Breast cancer Daughter 25       [place], genetic test negative     SOCIAL HISTORY   Social History   Tobacco Use   Smoking status: Never Smoker   Smokeless tobacco: Never Used  Substance Use Topics   Alcohol use: No   Drug use: No     MEDICATIONS   Current Medication:  Current Facility-Administered Medications:    acetaminophen (TYLENOL) tablet 650 mg, 650 mg, Oral, Q6H PRN **OR** acetaminophen (TYLENOL) suppository 650 mg, 650 mg, Rectal, Q6H PRN, Pyreddy, Pavan, MD   ceFEPIme (MAXIPIME) 1 g in sodium chloride 0.9 % 100 mL IVPB, 1 g, Intravenous, Q12H, Charlett Nose, RPH, Stopped at 12/18/2018 2224   famotidine (PEPCID) IVPB 20 mg premix, 20 mg, Intravenous, QHS, Charlett Nose, RPH, Stopped at 12/28/18 0034   heparin injection 5,000 Units, 5,000 Units, Subcutaneous, Q8H, Charlett Nose, RPH, 5,000 Units at  12/28/18 8657   lactated ringers infusion, , Intravenous, Continuous, Ottie Glazier, MD, Last Rate: 100 mL/hr at 12/28/18 0500   lactulose (CHRONULAC) enema 200 gm, 300 mL, Rectal, BID, Lanney Gins, Cyanna Neace, MD, 300 mL at 12/18/2018 2027   ondansetron (ZOFRAN) tablet 4 mg, 4 mg, Oral, Q6H PRN **OR** ondansetron (ZOFRAN) injection 4 mg, 4 mg, Intravenous, Q6H PRN, Pyreddy, Pavan, MD   rifaximin (XIFAXAN) tablet 550 mg, 550 mg, Oral, BID, Tahiliani, Varnita B, MD   vancomycin variable dose per unstable renal function (pharmacist dosing), , Does not apply, See admin instructions, Brantley Stage, Walid A, RPH    ALLERGIES   Lisinopril    REVIEW OF SYSTEMS     Unable to obtain due to obtunded state and lethargy  PHYSICAL EXAMINATION   Vitals:   12/28/18 0600 12/28/18 0700  BP: 116/68 (!) 141/86  Pulse: (!) 118 (!) 125  Resp: (!) 28 (!) 25  Temp:    SpO2: 96% 96%    GENERAL: Age-appropriate patient with altered mental status HEAD: Normocephalic, atraumatic.  EYES: Pupils equal, round, reactive to light.  No scleral icterus.  MOUTH: Moist mucosal membrane. NECK: Supple. No thyromegaly. No nodules. No JVD.  PULMONARY: Mild bibasilar crackles CARDIOVASCULAR: S1 and S2. Regular rate and rhythm. No murmurs, rubs, or gallops.  GASTROINTESTINAL: Soft, nontender, non-distended. No masses. Positive bowel sounds. No hepatosplenomegaly.  MUSCULOSKELETAL: No swelling, clubbing, or edema.  NEUROLOGIC: Mild distress due to acute illness SKIN:intact,warm,dry   LABS AND IMAGING     LAB RESULTS: Recent Labs  Lab 12/23/18 0815 12/06/2018 0940 12/28/18 0324  NA 136 135  140  K 3.0* 3.6 3.7  CL 101 95* 105  CO2 22 17* 21*  BUN 6* 50* 44*  CREATININE 0.70 3.28* 1.59*  GLUCOSE 107* 122* 116*   Recent Labs  Lab 12/23/18 0815 12/14/2018 0940 12/28/18 0324  HGB 10.3* 12.7 11.9*  HCT 33.4* 40.6 38.4  WBC 11.1* 17.5* 16.6*  PLT 334 395 311     IMAGING RESULTS: Ct Head Wo  Contrast  Result Date: 12/22/2018 CLINICAL DATA:  Liver carcinoma with altered mental status/confusion EXAM: CT HEAD WITHOUT CONTRAST TECHNIQUE: Contiguous axial images were obtained from the base of the skull through the vertex without intravenous contrast. COMPARISON:  None. FINDINGS: Brain: Ventricles and sulci are within normal limits for age. There is no demonstrable intracranial mass, hemorrhage, extra-axial fluid collection, or midline shift. There is slight small vessel disease in the centra semiovale bilaterally. No acute infarct evident. Vascular: No hyperdense vessel. There are foci of calcification in each carotid siphon region. Skull: The bony calvarium appears intact. No neoplastic focus involving bony structures. Sinuses/Orbits: Visualized paranasal sinuses are clear. Visualized orbits appear symmetric bilaterally. Other: Mastoid air cells are clear. IMPRESSION: Mild periventricular small vessel disease. No acute infarct. No mass or hemorrhage. There are foci of arterial vascular calcification. Electronically Signed   By: Lowella Grip III M.D.   On: 12/06/2018 09:46   Dg Chest Port 1 View  Result Date: 12/20/2018 CLINICAL DATA:  73 year old female with history of confusion and disorientation after initial doses of chemotherapy for liver cancer. EXAM: PORTABLE CHEST 1 VIEW COMPARISON:  No priors. FINDINGS: Lung volumes are low. Linear opacities in lung bases bilaterally favored to reflect areas of subsegmental atelectasis or scarring. No consolidative airspace disease. No pleural effusions. No evidence of pulmonary edema. Heart size is normal. The patient is rotated to the left on today's exam, resulting in distortion of the mediastinal contours and reduced diagnostic sensitivity and specificity for mediastinal pathology. Aortic atherosclerosis. IMPRESSION: 1. Low lung volumes with bibasilar areas of atelectasis and/or scarring (left greater than right). 2. Aortic atherosclerosis.  Electronically Signed   By: Vinnie Langton M.D.   On: 12/25/2018 09:13   US Abdomen Limited Ruq  Result Date: 12/16/2018 CLINICAL DATA:  Hepatocellular carcinoma with tumor thrombus shown on prior imaging. Elevated liver enzymes. EXAM: ULTRASOUND ABDOMEN LIMITED RIGHT UPPER QUADRANT COMPARISON:  11/11/2018 MRI FINDINGS: Gallbladder: Surgically absent Common bile duct: Diameter: 5 mm Liver: Heterogeneous right hepatic lobe likely due to underlying tumors and underlying tumor thrombus. There is notable thrombus in the vicinity of the right portal vein and portal vein confluence, with some reconstitution of portal venous flow or flow around the thrombus in the porta hepatis. There is some evidence of retrograde flow in parts of the portal vein before entering the liver for example on image 27 and image 28. There is also some heterogeneity in the left hepatic lobe, for example a 3.7 by 2.8 by 3.4 cm potential lesion posteriorly, and tumor involvement in the left hepatic lobe is not excluded. IMPRESSION: 1. Tumor primarily in the right hepatic lobe and possibly in the left hepatic lobe, with ill definition. 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 There is some retrograde flow in the segment of the portal vein leading towards this thrombus as shown on image 27. 2. No significant biliary dilatation is identified. Electronically Signed   By: Van Clines M.D.   On: 12/20/2018 17:04      ASSESSMENT  AND PLAN    -Multidisciplinary rounds held today  Altered mental status with lethargy  - likely due to uremic encephalopathy, possible septic encephalopathy with concomitant hepatic encephalopathy.   - stopping lactulose due to high volume diarreah and significant improvement in venous ammonia level - fluid resuscitation and empiric IV antibiotics - on immunotherapy with Avastin and Tecentriq which can predispose to infection and  extensive cytolysis for bulky tumors including HCC.  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 Tasia Catchings oncology-appreciate input -Status post treatment with Tecentriq and Avastin     Acute Kidney Injury - KDIGO stage 3  - noted UA  - d/c non-essential nephrotoxic medications  - suspect partially due to transient hypotension  - renal US   - urine electrolytes      Probable sepsis -Blood culture x2 -will consider LP -on immunotherapy predisposing to infection 1 -Procalcitonin elevated >4, trending -use vasopressors to keep MAP>65 -follow ABG and LA -follow up cultures -emperic ABX- cefepime, vancomycin, rifaximin -consider stress dose steroids -noted elevated lactate, trending    Transaminitis -Possibly due to transient hypotension, hepatic congestion in lieu of extensive thormbosis of portal venous circulation, HCC itself, and with component possibly related to recent immunotherapy  -DC nonessential essential hepatotoxic medications and IV resuscitation    GI/Nutrition GI PROPHYLAXIS as indicated DIET-->TF's as tolerated Constipation protocol as indicated  ENDO - ICU hypoglycemic\Hyperglycemia protocol -check FSBS per protocol   ELECTROLYTES -follow labs as needed -replace as needed -pharmacy consultation   DVT/GI PRX ordered -SCDs  TRANSFUSIONS AS NEEDED MONITOR FSBS ASSESS the need for LABS as needed   Critical care provider statement:    Critical care time (minutes):   33   Critical care time was exclusive of:  Separately billable procedures and treating other patients   Critical care was necessary to treat or prevent imminent or life-threatening deterioration of the following conditions:   Acute mental status with lethargy, transaminitis, newly diagnosed hepatocellular carcinoma, extensive venous portal thrombosis, acute kidney injury stage III, probable sepsis, multiple comorbid conditions   Critical care was time spent personally by me on the following activities:  Development of treatment plan with patient or surrogate, discussions with consultants, evaluation of patient's response to treatment, examination of patient, obtaining history from patient or surrogate, ordering and performing treatments and interventions, ordering and review of laboratory studies and re-evaluation of patient's condition.  I assumed direction of critical care for this patient from another provider in my specialty: no    This document was prepared using Dragon voice recognition software and may include unintentional dictation errors.    Ottie Glazier, M.D.  Division of [place]

## 2018-12-28 NOTE — Consult Note (Signed)
Pharmacy Antibiotic Note  Abigail Ayers is a 73 y.o. female admitted on 12/28/2018 with sepsis.  Pharmacy has been consulted for Cefepime and Vancomycin dosing. Patient currently suffering from AKI(baseline<1). Doses have been adjusted accordingly.  Plan: 1) Vancomycin 1g IV x 1. Will obtain serum creatinine with am labs and dose as appropriate. Will wait for ICU rounds on 7/1 prior to giving additional dose of vancomycin.   2) Cefepime 1g Q12 hours   Height: 5' 0.5" (153.7 cm) Weight: 165 lb 2 oz (74.9 kg) IBW/kg (Calculated) : 46.65  Temp (24hrs), Avg:98.8 F (37.1 C), Min:98.2 F (36.8 C), Max:99.4 F (37.4 C)  Recent Labs  Lab 12/23/18 0815 11/29/2018 0940 12/07/2018 1427 12/28/18 0324  WBC 11.1* 17.5*  --  16.6*  CREATININE 0.70 3.28*  --  1.59*  LATICACIDVEN  --  5.6* 4.3*  --   VANCORANDOM  --   --   --  16    Estimated Creatinine Clearance: 29.7 mL/min (A) (by C-G formula based on SCr of 1.59 mg/dL (H)).    Allergies  Allergen Reactions  . Lisinopril Other (See Comments)    Other reaction(s): Cough    Antimicrobials this admission: Vancomycin 6/29 >>   Cefepime 6/29 >> Flagyl 6/29 x 1   Microbiology results: 6/29 BCx: no growth < 24 hours  6/29 UCx: pending  6/29 COVID: negative  6/29 MRSA: negative   Thank you for allowing pharmacy to be a part of this patient's care.  Simpson,Michael L 12/28/2018 5:09 PM

## 2018-12-28 NOTE — Progress Notes (Signed)
Abigail Antigua, MD [address], [place], [place], [place], [address] Abigail Ayers, [place], [place], [place], [address] Phone: [phone]  Fax: [phone]   Subjective: Patient remains confused this morning.   Objective: Exam: Vital signs in last 24 hours: Vitals:   12/28/18 1000 12/28/18 1100 12/28/18 1200 12/28/18 1300  BP: 123/78 138/84 (!) 153/82   Pulse: (!) 119 (!) 117 (!) 126 (!) 121  Resp: (!) 29 (!) 29 (!) 30 (!) 33  Temp:      TempSrc:      SpO2: 95% 93% 99% 100%  Weight:      Height:       Weight change:   Intake/Output Summary (Last 24 hours) at 12/28/2018 1318 Last data filed at 12/28/2018 1300 Gross per 24 hour  Intake 5317.73 ml  Output 850 ml  Net 4467.73 ml    General: No acute distress Abd: Soft, NT/ND, No HSM Skin: Warm, no rashes Neck: Supple, Trachea midline   Lab Results: Lab Results  Component Value Date   WBC 16.6 (H) 12/28/2018   HGB 11.9 (L) 12/28/2018   HCT 38.4 12/28/2018   MCV 76.5 (L) 12/28/2018   PLT 311 12/28/2018   Micro Results: Recent Results (from the past 240 hour(s))  Novel Coronavirus, NAA ([facility])     Status: None   Collection Time: 12/20/18 10:18 AM  Result Value Ref Range Status   SARS-CoV-2, NAA Not Detected Not Detected Final    Comment: This test was developed and its performance characteristics determined by [employer]. This test has not been FDA cleared or approved. This test has been authorized by FDA under an Emergency Use Authorization (EUA). This test is only authorized for the duration of time the declaration that circumstances exist justifying the authorization of the emergency use of in vitro diagnostic tests for detection of SARS-CoV-2 virus and/or diagnosis of COVID-19 infection under section 564(b)(1) of the Act, 21 U.S.C. 008QPY-1(P)(5), unless the authorization is terminated or revoked sooner. When diagnostic testing is negative, the possibility of a false negative  result should be considered in the context of a patient's recent exposures and the presence of clinical signs and symptoms consistent with COVID-19. An individual without symptoms of COVID-19 and who is not shedding SARS-CoV-2 virus would expect to have a negative (not detected) result in this assay.   Blood culture (routine x 2)     Status: None (Preliminary result)   Collection Time: 12/20/2018  9:40 AM   Specimen: BLOOD  Result Value Ref Range Status   Specimen Description BLOOD LEFT ANTECUBITAL  Final   Special Requests   Final    BOTTLES DRAWN AEROBIC AND ANAEROBIC Blood Culture adequate volume   Culture   Final    NO GROWTH < 24 HOURS Performed at [facility], [address] [place], [place] [address]    Report Status PENDING  Incomplete  Blood culture (routine x 2)     Status: None (Preliminary result)   Collection Time: 12/22/2018  9:51 AM   Specimen: BLOOD LEFT HAND  Result Value Ref Range Status   Specimen Description BLOOD LEFT HAND  Final   Special Requests   Final    BOTTLES DRAWN AEROBIC AND ANAEROBIC Blood Culture results may not be optimal due to an inadequate volume of blood received in culture bottles   Culture   Final    NO GROWTH < 24 HOURS Performed at [facility], [address], [place], [place] [address]  Report Status PENDING  Incomplete  SARS Coronavirus 2 (CEPHEID - Performed in [place] hospital lab), Hosp Order     Status: None   Collection Time: 12/02/2018 10:24 AM   Specimen: Nasopharyngeal Swab  Result Value Ref Range Status   SARS Coronavirus 2 NEGATIVE NEGATIVE Final    Comment: (NOTE) If result is NEGATIVE SARS-CoV-2 target nucleic acids are NOT DETECTED. The SARS-CoV-2 RNA is generally detectable in upper and lower  respiratory specimens during the acute phase of infection. The lowest  concentration of SARS-CoV-2 viral copies this assay can detect is 250  copies / mL. A negative result does not preclude SARS-CoV-2  infection  and should not be used as the sole basis for treatment or other  patient management decisions.  A negative result may occur with  improper specimen collection / handling, submission of specimen other  than nasopharyngeal swab, presence of viral mutation(s) within the  areas targeted by this assay, and inadequate number of viral copies  (<250 copies / mL). A negative result must be combined with clinical  observations, patient history, and epidemiological information. If result is POSITIVE SARS-CoV-2 target nucleic acids are DETECTED. The SARS-CoV-2 RNA is generally detectable in upper and lower  respiratory specimens dur ing the acute phase of infection.  Positive  results are indicative of active infection with SARS-CoV-2.  Clinical  correlation with patient history and other diagnostic information is  necessary to determine patient infection status.  Positive results do  not rule out bacterial infection or co-infection with other viruses. If result is PRESUMPTIVE POSTIVE SARS-CoV-2 nucleic acids MAY BE PRESENT.   A presumptive positive result was obtained on the submitted specimen  and confirmed on repeat testing.  While 2019 novel coronavirus  (SARS-CoV-2) nucleic acids may be present in the submitted sample  additional confirmatory testing may be necessary for epidemiological  and / or clinical management purposes  to differentiate between  SARS-CoV-2 and other Sarbecovirus currently known to infect humans.  If clinically indicated additional testing with an alternate test  methodology [phone]) is advised. The SARS-CoV-2 RNA is generally  detectable in upper and lower respiratory sp ecimens during the acute  phase of infection. The expected result is Negative. Fact Sheet for Patients:  StrictlyIdeas.no Fact Sheet for Healthcare Providers: BankingDealers.co.za This test is not yet approved or cleared by the [place]  FDA and has been authorized for detection and/or diagnosis of SARS-CoV-2 by FDA under an Emergency Use Authorization (EUA).  This EUA will remain in effect (meaning this test can be used) for the duration of the COVID-19 declaration under Section 564(b)(1) of the Act, 21 U.S.C. section 360bbb-3(b)(1), unless the authorization is terminated or revoked sooner. Performed at [facility], [address] [place], [place] [address]   MRSA PCR Screening     Status: None   Collection Time: 12/07/2018  3:29 PM   Specimen: Nasal Mucosa; Nasopharyngeal  Result Value Ref Range Status   MRSA by PCR NEGATIVE NEGATIVE Final    Comment:        The GeneXpert MRSA Assay (FDA approved for NASAL specimens only), is one component of a comprehensive MRSA colonization surveillance program. It is not intended to diagnose MRSA infection nor to guide or monitor treatment for MRSA infections. Performed at [facility], [address] [place], [place] [address]    Studies/Results: Ct Head Wo Contrast  Result Date: 12/16/2018 CLINICAL DATA:  Liver carcinoma with altered mental status/confusion EXAM: CT HEAD WITHOUT CONTRAST  TECHNIQUE: Contiguous axial images were obtained from the base of the skull through the vertex without intravenous contrast. COMPARISON:  None. FINDINGS: Brain: Ventricles and sulci are within normal limits for age. There is no demonstrable intracranial mass, hemorrhage, extra-axial fluid collection, or midline shift. There is slight small vessel disease in the centra semiovale bilaterally. No acute infarct evident. Vascular: No hyperdense vessel. There are foci of calcification in each carotid siphon region. Skull: The bony calvarium appears intact. No neoplastic focus involving bony structures. Sinuses/Orbits: Visualized paranasal sinuses are clear. Visualized orbits appear symmetric bilaterally. Other: Mastoid air cells are clear. IMPRESSION: Mild periventricular small  vessel disease. No acute infarct. No mass or hemorrhage. There are foci of arterial vascular calcification. Electronically Signed   By: Lowella Grip III M.D.   On: 12/25/2018 09:46   Dg Chest Port 1 View  Result Date: 12/14/2018 CLINICAL DATA:  73 year old female with history of confusion and disorientation after initial doses of chemotherapy for liver cancer. EXAM: PORTABLE CHEST 1 VIEW COMPARISON:  No priors. FINDINGS: Lung volumes are low. Linear opacities in lung bases bilaterally favored to reflect areas of subsegmental atelectasis or scarring. No consolidative airspace disease. No pleural effusions. No evidence of pulmonary edema. Heart size is normal. The patient is rotated to the left on today's exam, resulting in distortion of the mediastinal contours and reduced diagnostic sensitivity and specificity for mediastinal pathology. Aortic atherosclerosis. IMPRESSION: 1. Low lung volumes with bibasilar areas of atelectasis and/or scarring (left greater than right). 2. Aortic atherosclerosis. Electronically Signed   By: Vinnie Langton M.D.   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 Elevated liver enzymes. EXAM: ULTRASOUND ABDOMEN LIMITED RIGHT UPPER QUADRANT COMPARISON:  11/11/2018 MRI FINDINGS: Gallbladder: Surgically absent Common bile duct: Diameter: 5 mm Liver: Heterogeneous right hepatic lobe likely due to underlying tumors and underlying tumor thrombus. There is notable thrombus in the vicinity of the right portal vein and portal vein confluence, with some reconstitution of portal venous flow or flow around the thrombus in the porta hepatis. There is some evidence of retrograde flow in parts of the portal vein before entering the liver for example on image 27 and image 28. There is also some heterogeneity in the left hepatic lobe, for example a 3.7 by 2.8 by 3.4 cm potential  lesion posteriorly, and tumor involvement in the left hepatic lobe is not excluded. IMPRESSION: 1. Tumor primarily in the right hepatic lobe and possibly in the left hepatic lobe, with ill definition. 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 There is some retrograde flow in the segment of the portal vein leading towards this thrombus as shown on image 27. 2. No significant biliary dilatation is identified. Electronically Signed   By: Van Clines M.D.   On: 12/11/2018 17:04   Medications:  Scheduled Meds:  heparin injection (subcutaneous)  5,000 Units Subcutaneous Q8H   rifaximin  550 mg Oral BID   vancomycin variable dose per unstable renal function (pharmacist dosing)   Does not apply See admin instructions   Continuous Infusions:  allopurinol (ZYLOPRIM) IVPB     ceFEPime (MAXIPIME) IV Stopped (12/28/18 1225)   famotidine (PEPCID) IV Stopped (12/28/18 0034)   lactated ringers 100 mL/hr at 12/28/18 1300   PRN Meds:.acetaminophen **OR** acetaminophen, ondansetron **OR** ondansetron (ZOFRAN) IV   Assessment: Active Problems:   Sepsis ([place])  Altered mental status   Dehydration   Hyperammonemia (HCC)   Elevated liver enzymes    Plan: Liver enzymes remain elevated Ultrasound shows known hepatic tumor, extensive portal vein thrombosis previously known as well.  No biliary duct dilation  Ammonia level has decreased, but patient remains confused.  She is also on rifaximin and lactulose.  patient's altered mental status is likely multifactorial, given elevated white count, AKI, dehydration  Elevated liver enzymes possibly due to worsening chronic disease with HCC, and acute issues with sepsis, and possibly hypotension causing elevated liver enzymes.  We will order further liver work-up  Patient's prognosis remains guarded.  Oncology following and considering hospice care if patient does not improve  within the next couple days.     LOS: 1 day   Abigail Antigua, MD 12/28/2018, 1:18 PM

## 2018-12-28 NOTE — Progress Notes (Signed)
Pharmacy Electrolyte Monitoring Consult:  Pharmacy consulted to assist in monitoring and replacing electrolytes in this 73 y.o. female admitted on 12/09/2018 with Altered Mental Status   Labs:  Sodium (mmol/L)  Date Value  12/28/2018 140   Potassium (mmol/L)  Date Value  12/28/2018 3.7   Magnesium (mg/dL)  Date Value  12/28/2018 2.5 (H)   Phosphorus (mg/dL)  Date Value  12/09/2018 2.7   Calcium (mg/dL)  Date Value  12/28/2018 8.6 (L)   Albumin (g/dL)  Date Value  12/28/2018 2.9 (L)    Assessment/Plan: Patient receiving lactulose enema BID and unable to take oral medications.   Will defer replacement at this time.   Will obtain electrolytes with am labs.   Pharmacy will continue to monitor and adjust per consult.     Ventresca L 12/28/2018 5:08 PM

## 2018-12-28 NOTE — Progress Notes (Signed)
[facility] NAME: Tiki Tucciarone    MR#:  [id number]  DATE OF BIRTH:  1945/08/17  SUBJECTIVE:  CHIEF COMPLAINT:   Chief Complaint  Patient presents with  . Altered Mental Status   Awake.  Nonverbal.  Afebrile.  No oral intake.  REVIEW OF SYSTEMS:    Review of Systems  Unable to perform ROS: Mental status change    DRUG ALLERGIES:   Allergies  Allergen Reactions  . Lisinopril Other (See Comments)    Other reaction(s): Cough    VITALS:  Blood pressure 123/78, pulse (!) 119, temperature 99.4 F (37.4 C), temperature source Axillary, resp. rate (!) 29, height 5' 0.5" (1.537 m), weight 74.9 kg, SpO2 95 %.  PHYSICAL EXAMINATION:   Physical Exam  GENERAL:  73 y.o.-year-old patient lying in the bed with no acute distress.  EYES: Pupils equal, round, reactive to light and accommodation. No scleral icterus. Extraocular muscles intact.  HEENT: Head atraumatic, normocephalic. Oropharynx and nasopharynx clear.  NECK:  Supple, no jugular venous distention. No thyroid enlargement, no tenderness.  LUNGS: Normal breath sounds bilaterally, no wheezing, rales, rhonchi. No use of accessory muscles of respiration.  CARDIOVASCULAR: S1, S2 normal. No murmurs, rubs, or gallops.  ABDOMEN: Soft, nontender, nondistended. Bowel sounds present. No organomegaly or mass.  EXTREMITIES: No cyanosis, clubbing or edema b/l.    NEUROLOGIC: Not following instructions but moves all 4 extremities PSYCHIATRIC: The patient is .  Awake but nonverbal and confused SKIN: No obvious rash, lesion, or ulcer.   LABORATORY PANEL:   CBC Recent Labs  Lab 12/28/18 0324  WBC 16.6*  HGB 11.9*  HCT 38.4  PLT 311   ------------------------------------------------------------------------------------------------------------------ Chemistries  Recent Labs  Lab 12/28/18 0324  NA 140  K 3.7  CL 105  CO2 21*  GLUCOSE 116*  BUN 44*  CREATININE 1.59*  CALCIUM  8.6*  MG 2.5*  AST 419*  ALT 103*  ALKPHOS 222*  BILITOT 2.8*   ------------------------------------------------------------------------------------------------------------------  Cardiac Enzymes No results for input(s): TROPONINI in the last 168 hours. ------------------------------------------------------------------------------------------------------------------  RADIOLOGY:  Ct Head Wo Contrast  Result Date: 12/15/2018 CLINICAL DATA:  Liver carcinoma with altered mental status/confusion EXAM: CT HEAD WITHOUT CONTRAST TECHNIQUE: Contiguous axial images were obtained from the base of the skull through the vertex without intravenous contrast. COMPARISON:  None. FINDINGS: Brain: Ventricles and sulci are within normal limits for age. There is no demonstrable intracranial mass, hemorrhage, extra-axial fluid collection, or midline shift. There is slight small vessel disease in the centra semiovale bilaterally. No acute infarct evident. Vascular: No hyperdense vessel. There are foci of calcification in each carotid siphon region. Skull: The bony calvarium appears intact. No neoplastic focus involving bony structures. Sinuses/Orbits: Visualized paranasal sinuses are clear. Visualized orbits appear symmetric bilaterally. Other: Mastoid air cells are clear. IMPRESSION: Mild periventricular small vessel disease. No acute infarct. No mass or hemorrhage. There are foci of arterial vascular calcification. Electronically Signed   By: Lowella Grip III M.D.   On: 11/30/2018 09:46   Dg Chest Port 1 View  Result Date: 12/16/2018 CLINICAL DATA:  73 year old female with history of confusion and disorientation after initial doses of chemotherapy for liver cancer. EXAM: PORTABLE CHEST 1 VIEW COMPARISON:  No priors. FINDINGS: Lung volumes are low. Linear opacities in lung bases bilaterally favored to reflect areas of subsegmental atelectasis or scarring. No consolidative airspace disease. No pleural effusions.  No evidence of pulmonary edema. Heart size is normal. The patient  is rotated to the left on today's exam, resulting in distortion of the mediastinal contours and reduced diagnostic sensitivity and specificity for mediastinal pathology. Aortic atherosclerosis. IMPRESSION: 1. Low lung volumes with bibasilar areas of atelectasis and/or scarring (left greater than right). 2. Aortic atherosclerosis. Electronically Signed   By: Vinnie Langton M.D.   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 Elevated liver enzymes. EXAM: ULTRASOUND ABDOMEN LIMITED RIGHT UPPER QUADRANT COMPARISON:  11/11/2018 MRI FINDINGS: Gallbladder: Surgically absent Common bile duct: Diameter: 5 mm Liver: Heterogeneous right hepatic lobe likely due to underlying tumors and underlying tumor thrombus. There is notable thrombus in the vicinity of the right portal vein and portal vein confluence, with some reconstitution of portal venous flow or flow around the thrombus in the porta hepatis. There is some evidence of retrograde flow in parts of the portal vein before entering the liver for example on image 27 and image 28. There is also some heterogeneity in the left hepatic lobe, for example a 3.7 by 2.8 by 3.4 cm potential lesion posteriorly, and tumor involvement in the left hepatic lobe is not excluded. IMPRESSION: 1. Tumor primarily in the right hepatic lobe and possibly in the left hepatic lobe, with ill definition. 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 There is some retrograde flow in the segment of the portal vein leading towards this thrombus as shown on image 27. 2. No significant biliary dilatation is identified. Electronically Signed   By: Van Clines M.D.   On: 12/08/2018 17:04     ASSESSMENT AND PLAN:   73 year old female with a known  history of hypertension, hepatocellular carcinoma presented to the emergency room for confusion  Patient is altered.   -Acute hepatic encephalopathy Lactulose.  Ammonia level has trended down Intensivist and gastroenterology evaluation Monitor  -Sepsis Unknown etiology Follow-up cultures and lactic acid level IV fluids Broad-spectrum antibiotics  -Hepatocellular carcinoma Oncology follow-up Poor prognosis  -Dehydration IV fluids  -Abnormal LFT secondary to hepatocellular carcinoma  -Acute kidney injury IV fluid hydration Follow-up renal function Improving slowly  -DVT prophylaxis subcu Lovenox daily  If no improvement will benefit from transition to hospice home.  All the records are reviewed and case discussed with Care Management/Social Worker Management plans discussed with the patient, family and they are in agreement.  CODE STATUS: FULL CODE  TOTAL TIME TAKING CARE OF THIS PATIENT: 35 minutes.   POSSIBLE D/C IN 3-4 DAYS, DEPENDING ON CLINICAL CONDITION.  Leia Alf Shyana Kulakowski M.D on 12/28/2018 at 11:08 AM  Between 7am to 6pm - Pager - [phone]  After 6pm go to [email or web address] - password [email or web address] [facility]  Office  [phone]  CC: Primary care physician; Sharyne Peach, MD  Note: This dictation was prepared with Dragon dictation along with smaller phrase technology. Any transcriptional errors that result from this process are unintentional.

## 2018-12-28 NOTE — Consult Note (Signed)
[place]  [phone] [phone]   Name: Abigail Ayers Date: 12/28/2018 MRN: [id number]  DOB: [birth year]  Patient Care Team: Sharyne Peach, MD as PCP - General (Family Medicine) Bary Castilla, Forest Gleason, MD (General Surgery) Sharyne Peach, MD (Family Medicine) Clent Jacks, RN as Registered Nurse    REASON FOR CONSULTATION: Palliative Care consult requested for this 73 y.o. female with multiple medical problems including locally advanced [place] currently on treatment with atezolizumab plus bevacizumab (last treated on 12/23/2018).  PMH also notable for history of HCV status post Harvoni treatment, and portal vein thrombus.    Patient was admitted to the hospital on 12/23/2018 with altered mental status and was found to have presumed sepsis from unclear source.  She was also found to have multiorgan dysfunction with renal antibiotic involvement. Patient was referred to palliative care to help address symptoms and goals.   SOCIAL HISTORY:     reports that she has never smoked. She has never used smokeless tobacco. She reports that she does not drink alcohol or use drugs.   Patient lives at home alone.  Patient has three daughters and a son who are involved in her care.  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  She has continued to work until recently.  ADVANCE DIRECTIVES:  Does not have  CODE STATUS: Full code  PAST MEDICAL HISTORY: Past Medical History:  Diagnosis Date   Hypertension    not currently taking meds    PAST SURGICAL HISTORY:  Past Surgical History:  Procedure Laterality Date   BREAST BIOPSY Left    neg   CHOLECYSTECTOMY  1992    HEMATOLOGY/ONCOLOGY HISTORY:  Oncology History  Hepatocellular carcinoma ([facility])  12/16/2018 Initial Diagnosis   Hepatocellular carcinoma ([place])   12/23/2018 -  Chemotherapy   The patient had bevacizumab (AVASTIN)  1,200 mg in sodium chloride 0.9 % 100 mL chemo infusion, 15 mg/kg = 1,200 mg, Intravenous,  Once, 1 of 6 cycles Administration: 1,200 mg (12/23/2018) atezolizumab (TECENTRIQ) 1,200 mg in sodium chloride 0.9 % 250 mL chemo infusion, 1,200 mg, Intravenous, Once, 1 of 6 cycles Administration: 1,200 mg (12/23/2018)  for chemotherapy treatment.    12/25/2018 Cancer Staging   Staging form: Liver, AJCC 8th Edition - Clinical: Stage IIIB (cT4, cN0, cM0) - Signed by Earlie Server, MD on 12/25/2018     ALLERGIES:  is allergic to lisinopril.  MEDICATIONS:  Current Facility-Administered Medications  Medication Dose Route Frequency Provider Last Rate Last Dose   acetaminophen (TYLENOL) tablet 650 mg  650 mg Oral Q6H PRN Pyreddy, Reatha Harps, MD       Or   acetaminophen (TYLENOL) suppository 650 mg  650 mg Rectal Q6H PRN Pyreddy, Reatha Harps, MD       allopurinol (ALOPRIM) 100 mg in sodium chloride 0.9 % 100 mL IVPB  100 mg Intravenous Daily Aleskerov, Fuad, MD       ceFEPIme (MAXIPIME) 1 g in sodium chloride 0.9 % 100 mL IVPB  1 g Intravenous Q12H Charlett Nose, RPH   Stopped at 12/28/18 1225   famotidine (PEPCID) IVPB 20 mg premix  20 mg Intravenous QHS Charlett Nose, RPH   Stopped at 12/28/18 0034   heparin injection 5,000 Units  5,000 Units Subcutaneous Q8H Charlett Nose, RPH   5,000 Units at 12/28/18 7893   lactated ringers infusion   Intravenous Continuous Ottie Glazier, MD 100  mL/hr at 12/28/18 1300     ondansetron (ZOFRAN) tablet 4 mg  4 mg Oral Q6H PRN Saundra Shelling, MD       Or   ondansetron (ZOFRAN) injection 4 mg  4 mg Intravenous Q6H PRN Pyreddy, Reatha Harps, MD       rifaximin Doreene Nest) tablet 550 mg  550 mg Oral BID [name] B, MD       vancomycin variable dose per unstable renal function (pharmacist dosing)   Does not apply See admin instructions Pearla Dubonnet, RPH        VITAL SIGNS: BP (!) 153/82    Pulse (!) 121    Temp 99.4 F (37.4 C) (Axillary)    Resp (!) 33     Ht 5' 0.5" (1.537 m)    Wt 165 lb 2 oz (74.9 kg)    SpO2 100%    BMI 31.72 kg/m  Filed Weights   12/19/2018 0814 12/11/2018 1400 12/28/18 0400  Weight: 170 lb 4.8 oz (77.2 kg) 162 lb 11.2 oz (73.8 kg) 165 lb 2 oz (74.9 kg)    Estimated body mass index is 31.72 kg/m as calculated from the following:   Height as of this encounter: 5' 0.5" (1.537 m).   Weight as of this encounter: 165 lb 2 oz (74.9 kg).  LABS: CBC:    Component Value Date/Time   WBC 16.6 (H) 12/28/2018 0324   HGB 11.9 (L) 12/28/2018 0324   HCT 38.4 12/28/2018 0324   PLT 311 12/28/2018 0324   MCV 76.5 (L) 12/28/2018 0324   NEUTROABS 14.5 (H) 12/26/2018 0940   LYMPHSABS 0.8 12/08/2018 0940   MONOABS 1.8 (H) 11/29/2018 0940   EOSABS 0.1 12/01/2018 0940   BASOSABS 0.2 (H) 12/09/2018 0940   Comprehensive Metabolic Panel:    Component Value Date/Time   NA 140 12/28/2018 0324   K 3.7 12/28/2018 0324   CL 105 12/28/2018 0324   CO2 21 (L) 12/28/2018 0324   BUN 44 (H) 12/28/2018 0324   CREATININE 1.59 (H) 12/28/2018 0324   GLUCOSE 116 (H) 12/28/2018 0324   CALCIUM 8.6 (L) 12/28/2018 0324   AST 419 (H) 12/28/2018 0324   ALT 103 (H) 12/28/2018 0324   ALKPHOS 222 (H) 12/28/2018 0324   BILITOT 2.8 (H) 12/28/2018 0324   PROT 6.9 12/28/2018 0324   ALBUMIN 2.9 (L) 12/28/2018 0324    RADIOGRAPHIC STUDIES: Ct Head Wo Contrast  Result Date: 12/21/2018 CLINICAL DATA:  Liver carcinoma with altered mental status/confusion EXAM: CT HEAD WITHOUT CONTRAST TECHNIQUE: Contiguous axial images were obtained from the base of the skull through the vertex without intravenous contrast. COMPARISON:  None. FINDINGS: Brain: Ventricles and sulci are within normal limits for age. There is no demonstrable intracranial mass, hemorrhage, extra-axial fluid collection, or midline shift. There is slight small vessel disease in the centra semiovale bilaterally. No acute infarct evident. Vascular: No hyperdense vessel. There are foci of calcification in  each carotid siphon region. Skull: The bony calvarium appears intact. No neoplastic focus involving bony structures. Sinuses/Orbits: Visualized paranasal sinuses are clear. Visualized orbits appear symmetric bilaterally. Other: Mastoid air cells are clear. IMPRESSION: Mild periventricular small vessel disease. No acute infarct. No mass or hemorrhage. There are foci of arterial vascular calcification. Electronically Signed   By: Lowella Grip III M.D.   On: 12/11/2018 09:46   Ct Chest Wo Contrast  Result Date: 12/15/2018 CLINICAL DATA:  Hepatocellular carcinoma staging. Chest pain. Portal vein thrombosis. EXAM: CT CHEST WITHOUT CONTRAST TECHNIQUE: Multidetector CT  imaging of the chest was performed following the standard protocol without IV contrast. COMPARISON:  Abdominal CT 11/05/2018.  No prior chest imaging. FINDINGS: Cardiovascular: Aortic atherosclerosis. Tortuous thoracic aorta. Borderline cardiomegaly. Multivessel coronary artery atherosclerosis. Mediastinum/Nodes: No supraclavicular adenopathy. No mediastinal or definite hilar adenopathy, given limitations of unenhanced CT. Lungs/Pleura: Trace right pleural fluid. No suspicious pulmonary nodule or mass. Areas of bilateral major fissure thickening versus small subpleural lymph nodes. A right apical subpleural 3 mm nodule on image 29/3 is also most likely a subpleural lymph node. Upper Abdomen: Cirrhosis with heterogeneity throughout the liver, better evaluated on contrast-enhanced studies. New small volume ascites since 11/05/2018. Musculoskeletal: Thoracic spondylosis. IMPRESSION: 1.  No acute process or evidence of metastatic disease in the chest. 2. Coronary artery atherosclerosis. Aortic Atherosclerosis (ICD10-I70.0). 3. Trace right pleural fluid. 4. New small volume ascites. Electronically Signed   By: Abigail Miyamoto M.D.   On: 12/15/2018 08:51   US Biopsy (liver)  Result Date: 12/06/2018 INDICATION: 73 year old female with a history liver mass  EXAM: ULTRASOUND-GUIDED BIOPSY LIVER MASS MEDICATIONS: None. ANESTHESIA/SEDATION: Moderate (conscious) sedation was employed during this procedure. A total of Versed 1.5 mg and Fentanyl 75 mcg was administered intravenously. Moderate Sedation Time: 25 minutes. The patient's level of consciousness and vital signs were monitored continuously by radiology nursing throughout the procedure under my direct supervision. FLUOROSCOPY TIME:  ULTRASOUND COMPLICATIONS: NONE PROCEDURE: Informed written consent was obtained from the patient after a thorough discussion of the procedural risks, benefits and alternatives. All questions were addressed. Maximal Sterile Barrier Technique was utilized including caps, mask, sterile gowns, sterile gloves, sterile drape, hand hygiene and skin antiseptic. A timeout was performed prior to the initiation of the procedure Ultrasound survey of the right liver lobe performed with images stored and sent to PACs. The right lower thorax/right upper abdomen was prepped with chlorhexidine in a sterile fashion, and a sterile drape was applied covering the operative field. A sterile gown and sterile gloves were used for the procedure. Local anesthesia was provided with 1% Lidocaine. Once the patient is prepped and draped sterilely and the skin and subcutaneous tissues were generously infiltrated with 1% lidocaine, a small stab incision was made with an 11 blade scalpel. A 17 gauge introducer needle was advanced under ultrasound guidance in an intercostal location into the right liver lobe, targeting ill-defined parenchyma of the liver tissue of the right liver. The stylet was removed, and multiple separate 18 gauge core biopsy were retrieved. Samples were placed into formalin for transportation to the lab. Gel-Foam slurry was then infused with a small amount of saline for assistance with hemostasis. The needle was removed, and a final ultrasound image was performed. The patient tolerated the procedure  well and remained hemodynamically stable throughout. No complications were encountered and no significant blood loss was encounter. IMPRESSION: Status post ultrasound-guided biopsy of ill-defined mass of the right liver. Signed, Dulcy Fanny. Dellia Nims, RPVI [facility] Electronically Signed   By: Corrie Mckusick D.O.   On: 12/06/2018 10:17   Dg Chest Port 1 View  Result Date: 12/11/2018 CLINICAL DATA:  73 year old female with history of confusion and disorientation after initial doses of chemotherapy for liver cancer. EXAM: PORTABLE CHEST 1 VIEW COMPARISON:  No priors. FINDINGS: Lung volumes are low. Linear opacities in lung bases bilaterally favored to reflect areas of subsegmental atelectasis or scarring. No consolidative airspace disease. No pleural effusions. No evidence of pulmonary edema. Heart size is normal. The patient is rotated  to the left on today's exam, resulting in distortion of the mediastinal contours and reduced diagnostic sensitivity and specificity for mediastinal pathology. Aortic atherosclerosis. IMPRESSION: 1. Low lung volumes with bibasilar areas of atelectasis and/or scarring (left greater than right). 2. Aortic atherosclerosis. Electronically Signed   By: Vinnie Langton M.D.   On: 12/28/2018 09:13   US Abdomen Limited Ruq  Result Date: 12/14/2018 CLINICAL DATA:  Hepatocellular carcinoma with tumor thrombus shown on prior imaging. Elevated liver enzymes. EXAM: ULTRASOUND ABDOMEN LIMITED RIGHT UPPER QUADRANT COMPARISON:  11/11/2018 MRI FINDINGS: Gallbladder: Surgically absent Common bile duct: Diameter: 5 mm Liver: Heterogeneous right hepatic lobe likely due to underlying tumors and underlying tumor thrombus. There is notable thrombus in the vicinity of the right portal vein and portal vein confluence, with some reconstitution of portal venous flow or flow around the thrombus in the porta hepatis. There is some evidence of  retrograde flow in parts of the portal vein before entering the liver for example on image 27 and image 28. There is also some heterogeneity in the left hepatic lobe, for example a 3.7 by 2.8 by 3.4 cm potential lesion posteriorly, and tumor involvement in the left hepatic lobe is not excluded. IMPRESSION: 1. Tumor primarily in the right hepatic lobe and possibly in the left hepatic lobe, with ill definition. 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 There is some retrograde flow in the segment of the portal vein leading towards this thrombus as shown on image 27. 2. No significant biliary dilatation is identified. Electronically Signed   By: Van Clines M.D.   On: 12/15/2018 17:04    PERFORMANCE STATUS (ECOG) : 4 - Bedbound  Review of Systems Unable to provide  Physical Exam General: Critically ill-appearing Cardiovascular: Tachycardic Pulmonary: clear ant fields, unlabored Extremities: no edema Skin: no rashes Neurological: Unresponsive  IMPRESSION: Patient is currently in ICU.  She remains encephalopathic and is unable to participate in conversations regarding goals.  I had a lengthy meeting with patient's son, [name], who was at bedside.  Together, we reviewed patient's multiple medical problems.  He spoke yesterday with Dr. Tasia Catchings.  He says that family is aware the patient's prognosis is poor but they remain hopeful that she will improve and be able to talk to them about her goals for care.  He also recognizes that even with improvement, patient might not return to her previous baseline and may not be a future candidate for chemotherapy.  Patient had previously verbalized her primary goal of maintaining functional independence and we discussed how this illness might affect that goal.  Son says that family are in agreement with current scope of treatment.  We did discuss CODE STATUS and son says that he needs more  time to speak with his siblings prior to making a decision.    We will follow  PLAN: -Continue current scope of treatment -Family considering CODE STATUS -We will continue to follow and speak with family regarding goals   Patient expressed understanding and was in agreement with this plan. She also understands that She can call the clinic at any time with any questions, concerns, or complaints.     Time Total: 60 minutes  Visit consisted of counseling and education dealing with the complex and emotionally intense issues of symptom management and palliative care in the setting of serious and potentially life-threatening illness.Greater than 50%  of this time was spent counseling and coordinating care related to the  above assessment and plan.  Signed by: Altha Harm, PhD, NP-C [phone] (Work Cell)

## 2018-12-28 NOTE — Progress Notes (Signed)
Pastoral Care Visit   12/28/18 1135  Clinical Encounter Type  Visited With Family  Visit Type Psychological support;Critical Care  Referral From Family  Consult/Referral To Chaplain  Recommendations f/u  Spiritual Encounters  Spiritual Needs Emotional;Grief support  Stress Factors  Family Stress Factors Loss of control;Health changes  Pt son, Toney Rakes, was visiting mother who is sick.  Son shared about his grief over his mom's condition. Chap provided emotional support through active listening.  Darcey Nora, Chaplain

## 2018-12-29 ENCOUNTER — Inpatient Hospital Stay: Payer: Medicare Other

## 2018-12-29 ENCOUNTER — Telehealth: Payer: Self-pay | Admitting: Oncology

## 2018-12-29 ENCOUNTER — Inpatient Hospital Stay: Payer: Medicare Other | Admitting: Oncology

## 2018-12-29 ENCOUNTER — Other Ambulatory Visit: Payer: Medicare Other

## 2018-12-29 LAB — CERULOPLASMIN: Ceruloplasmin: 49 mg/dL — ABNORMAL HIGH (ref 19.0–39.0)

## 2018-12-29 LAB — CMV IGM: CMV IgM: <30 AU/mL (ref 0.0–29.9)

## 2018-12-29 LAB — HEPATITIS C ANTIBODY: HCV Ab: >11 s/co ratio — ABNORMAL HIGH (ref 0.0–0.9)

## 2018-12-29 LAB — COMPREHENSIVE METABOLIC PANEL
ALT: 81 U/L — ABNORMAL HIGH (ref 0–44)
AST: 185 U/L — ABNORMAL HIGH (ref 15–41)
Albumin: 2.5 g/dL — ABNORMAL LOW (ref 3.5–5.0)
Alkaline Phosphatase: 251 U/L — ABNORMAL HIGH (ref 38–126)
Anion gap: 15 (ref 5–15)
BUN: 34 mg/dL — ABNORMAL HIGH (ref 8–23)
CO2: 21 mmol/L — ABNORMAL LOW (ref 22–32)
Calcium: 8.8 mg/dL — ABNORMAL LOW (ref 8.9–10.3)
Chloride: 105 mmol/L (ref 98–111)
Creatinine, Ser: 0.95 mg/dL (ref 0.44–1.00)
GFR calc Af Amer: >60 mL/min (ref >60)
GFR calc non Af Amer: >60 mL/min (ref >60)
Glucose, Bld: 101 mg/dL — ABNORMAL HIGH (ref 70–99)
Potassium: 3.3 mmol/L — ABNORMAL LOW (ref 3.5–5.1)
Sodium: 141 mmol/L (ref 135–145)
Total Bilirubin: 3.4 mg/dL — ABNORMAL HIGH (ref 0.3–1.2)
Total Protein: 6.2 g/dL — ABNORMAL LOW (ref 6.5–8.1)

## 2018-12-29 LAB — CBC
HCT: 39.6 % (ref 36.0–46.0)
Hemoglobin: 12.3 g/dL (ref 12.0–15.0)
MCH: 23.9 pg — ABNORMAL LOW (ref 26.0–34.0)
MCHC: 31.1 g/dL (ref 30.0–36.0)
MCV: 77 fL — ABNORMAL LOW (ref 80.0–100.0)
Platelets: 259 10*3/uL (ref 150–400)
RBC: 5.14 MIL/uL — ABNORMAL HIGH (ref 3.87–5.11)
RDW: 21.2 % — ABNORMAL HIGH (ref 11.5–15.5)
WBC: 14.8 10*3/uL — ABNORMAL HIGH (ref 4.0–10.5)
nRBC: 3.1 % — ABNORMAL HIGH (ref 0.0–0.2)

## 2018-12-29 LAB — HEPATITIS A ANTIBODY, IGM: Hep A IgM: NEGATIVE

## 2018-12-29 LAB — C DIFFICILE QUICK SCREEN W PCR REFLEX
C Diff antigen: NEGATIVE
C Diff interpretation: NOT DETECTED
C Diff toxin: NEGATIVE

## 2018-12-29 LAB — GLUCOSE 6 PHOSPHATE DEHYDROGENASE
G6PDH: 11.4 U/g{Hb} (ref 4.6–13.5)
Hemoglobin: 12.6 g/dL (ref 11.1–15.9)

## 2018-12-29 LAB — HEPATITIS B CORE ANTIBODY, IGM: Hep B C IgM: NEGATIVE

## 2018-12-29 LAB — HSV(HERPES SIMPLEX VRS) I + II AB-IGM: HSVI/II Comb IgM: <0.91 Ratio (ref 0.00–0.90)

## 2018-12-29 LAB — RASBURICASE - URIC ACID: Uric Acid, Serum: 0.7 mg/dL — ABNORMAL LOW (ref 2.5–7.1)

## 2018-12-29 LAB — HEPATITIS B CORE ANTIBODY, TOTAL: Hep B Core Total Ab: NEGATIVE

## 2018-12-29 LAB — MITOCHONDRIAL ANTIBODIES: Mitochondrial M2 Ab, IgG: <20 Units (ref 0.0–20.0)

## 2018-12-29 LAB — ANTI-SMOOTH MUSCLE ANTIBODY, IGG: F-Actin IgG: 16 Units (ref 0–19)

## 2018-12-29 LAB — HEPATITIS B SURFACE ANTIGEN: Hepatitis B Surface Ag: NEGATIVE

## 2018-12-29 MED ORDER — MORPHINE SULFATE (PF) 2 MG/ML IV SOLN
1.0000 mg | INTRAVENOUS | Status: DC | PRN
  Administered 2018-12-29 – 2018-12-30 (×2): 2 mg via INTRAVENOUS
  Filled 2018-12-29 (×2): qty 1

## 2018-12-29 MED ORDER — VANCOMYCIN HCL 10 G IV SOLR
1250.0000 mg | INTRAVENOUS | Status: DC
  Administered 2018-12-29: 1250 mg via INTRAVENOUS
  Filled 2018-12-29 (×2): qty 1250

## 2018-12-29 MED ORDER — SODIUM CHLORIDE 0.9% FLUSH: 10.0000 mL | INTRAVENOUS | Status: DC | PRN

## 2018-12-29 MED ORDER — LACTATED RINGERS IV BOLUS
250.0000 mL | Freq: Once | INTRAVENOUS | Status: AC
  Administered 2018-12-29: 250 mL via INTRAVENOUS

## 2018-12-29 MED ORDER — SODIUM CHLORIDE 0.9 % IV SOLN
2.0000 g | Freq: Two times a day (BID) | INTRAVENOUS | Status: DC
  Administered 2018-12-29 (×2): 2 g via INTRAVENOUS
  Filled 2018-12-29 (×4): qty 2

## 2018-12-29 MED ORDER — NOREPINEPHRINE 4 MG/250ML-% IV SOLN
INTRAVENOUS | Status: AC
  Filled 2018-12-29: qty 250

## 2018-12-29 MED ORDER — POTASSIUM CHLORIDE 2 MEQ/ML IV SOLN
INTRAVENOUS | Status: DC
  Administered 2018-12-29 – 2018-12-30 (×2): via INTRAVENOUS
  Filled 2018-12-29 (×5): qty 1000

## 2018-12-29 MED ORDER — MORPHINE SULFATE (PF) 2 MG/ML IV SOLN
2.0000 mg | INTRAVENOUS | Status: DC | PRN
  Administered 2018-12-29: 2 mg via INTRAVENOUS

## 2018-12-29 MED ORDER — NOREPINEPHRINE 4 MG/250ML-% IV SOLN
0.0000 ug/min | INTRAVENOUS | Status: DC
  Administered 2018-12-29: 5 ug/min via INTRAVENOUS

## 2018-12-29 MED ORDER — DEXMEDETOMIDINE HCL IN NACL 400 MCG/100ML IV SOLN
0.4000 ug/kg/h | INTRAVENOUS | Status: DC
  Administered 2018-12-29: 18:00:00 0.6 ug/kg/h via INTRAVENOUS
  Administered 2018-12-30 (×2): 0.8 ug/kg/h via INTRAVENOUS
  Administered 2018-12-31: 0.6 ug/kg/h via INTRAVENOUS
  Administered 2018-12-31 – 2019-01-01 (×7): 1.2 ug/kg/h via INTRAVENOUS
  Administered 2019-01-01: 1 ug/kg/h via INTRAVENOUS
  Administered 2019-01-01 – 2019-01-02 (×3): 1.2 ug/kg/h via INTRAVENOUS
  Filled 2018-12-29 (×16): qty 100

## 2018-12-29 MED ORDER — SODIUM CHLORIDE 0.9% FLUSH
10.0000 mL | Freq: Two times a day (BID) | INTRAVENOUS | Status: DC
  Administered 2018-12-29: 10 mL
  Administered 2018-12-30: 21:00:00 20 mL
  Administered 2018-12-30 – 2019-01-07 (×10): 10 mL

## 2018-12-29 NOTE — Progress Notes (Signed)
[place]  [phone] [phone]   Name: Abigail Ayers Date: 12/29/2018 MRN: [id number]  DOB: [birth year]  Patient Care Team: Sharyne Peach, MD as PCP - General (Family Medicine) Bary Castilla, Forest Gleason, MD (General Surgery) Sharyne Peach, MD (Family Medicine) Clent Jacks, RN as Registered Nurse    REASON FOR CONSULTATION: Palliative Care consult requested for [phone] y.o.femalewith multiple medical problems including locally advanced HCC currently on treatment withatezolizumab plus bevacizumab (last treated on 12/23/2018).PMH also notable for history of HCV status post Harvoni treatment, and portal vein thrombus.  Patient was admitted to the hospital on 12/04/2018 with altered mental status and was found to have presumed sepsis from unclear source.  She was also found to have multiorgan dysfunction with renal antibiotic involvement. Patient was referred to palliative care to help address symptoms and goals.  CODE STATUS: Full code  PAST MEDICAL HISTORY: Past Medical History:  Diagnosis Date   Hypertension    not currently taking meds    PAST SURGICAL HISTORY:  Past Surgical History:  Procedure Laterality Date   BREAST BIOPSY Left    neg   CHOLECYSTECTOMY  1992    HEMATOLOGY/ONCOLOGY HISTORY:  Oncology History  Hepatocellular carcinoma ([place])  12/16/2018 Initial Diagnosis   Hepatocellular carcinoma ([place])   12/23/2018 -  Chemotherapy   The patient had bevacizumab (AVASTIN) 1,200 mg in sodium chloride 0.9 % 100 mL chemo infusion, 15 mg/kg = 1,200 mg, Intravenous,  Once, 1 of 6 cycles Administration: 1,200 mg (12/23/2018) atezolizumab (TECENTRIQ) 1,200 mg in sodium chloride 0.9 % 250 mL chemo infusion, 1,200 mg, Intravenous, Once, 1 of 6 cycles Administration: 1,200 mg (12/23/2018)  for chemotherapy treatment.    12/25/2018 Cancer Staging   Staging form: Liver, AJCC 8th Edition - Clinical:  Stage IIIB (cT4, cN0, cM0) - Signed by Earlie Server, MD on 12/25/2018     ALLERGIES:  is allergic to lisinopril.  MEDICATIONS:  Current Facility-Administered Medications  Medication Dose Route Frequency Provider Last Rate Last Dose   acetaminophen (TYLENOL) tablet 650 mg  650 mg Oral Q6H PRN Pyreddy, Reatha Harps, MD       Or   acetaminophen (TYLENOL) suppository 650 mg  650 mg Rectal Q6H PRN Pyreddy, Pavan, MD       ceFEPIme (MAXIPIME) 2 g in sodium chloride 0.9 % 100 mL IVPB  2 g Intravenous Q12H Charlett Nose, RPH 200 mL/hr at 12/29/18 0938 2 g at 12/29/18 0938   famotidine (PEPCID) IVPB 20 mg premix  20 mg Intravenous QHS Charlett Nose, [facility]   Stopped at 12/28/18 2143   heparin injection 5,000 Units  5,000 Units Subcutaneous Q8H Charlett Nose, RPH   5,000 Units at 12/29/18 0505   lactated ringers 1,000 mL with potassium chloride 20 mEq infusion   Intravenous Continuous Charlett Nose, RPH 100 mL/hr at 12/29/18 1014     morphine 2 MG/ML injection 1 mg  1 mg Intravenous Q3H PRN [name] D, NP   1 mg at 12/29/18 0602   ondansetron (ZOFRAN) tablet 4 mg  4 mg Oral Q6H PRN Saundra Shelling, MD       Or   ondansetron (ZOFRAN) injection 4 mg  4 mg Intravenous Q6H PRN Pyreddy, Reatha Harps, MD       rifaximin (XIFAXAN) tablet 550 mg  550 mg Oral BID [name] B, MD       vancomycin variable dose per unstable renal function (pharmacist dosing)  Does not apply See admin instructions Pearla Dubonnet, RPH        VITAL SIGNS: BP 109/73    Pulse (!) 117    Temp 98.7 F (37.1 C) (Axillary)    Resp (!) 22    Ht 5' 0.5" (1.537 m)    Wt 165 lb 2 oz (74.9 kg)    SpO2 94%    BMI 31.72 kg/m  Filed Weights   12/16/2018 0814 12/03/2018 1400 12/28/18 0400  Weight: 170 lb 4.8 oz (77.2 kg) 162 lb 11.2 oz (73.8 kg) 165 lb 2 oz (74.9 kg)    Estimated body mass index is 31.72 kg/m as calculated from the following:   Height as of this encounter: 5' 0.5" (1.537 m).   Weight as of this  encounter: 165 lb 2 oz (74.9 kg).  LABS: CBC:    Component Value Date/Time   WBC 14.8 (H) 12/29/2018 0601   HGB 12.3 12/29/2018 0601   HCT 39.6 12/29/2018 0601   PLT 259 12/29/2018 0601   MCV 77.0 (L) 12/29/2018 0601   NEUTROABS 14.5 (H) 12/28/2018 0940   LYMPHSABS 0.8 12/08/2018 0940   MONOABS 1.8 (H) 12/15/2018 0940   EOSABS 0.1 12/19/2018 0940   BASOSABS 0.2 (H) 12/06/2018 0940   Comprehensive Metabolic Panel:    Component Value Date/Time   NA 141 12/29/2018 0601   K 3.3 (L) 12/29/2018 0601   CL 105 12/29/2018 0601   CO2 21 (L) 12/29/2018 0601   BUN 34 (H) 12/29/2018 0601   CREATININE 0.95 12/29/2018 0601   GLUCOSE 101 (H) 12/29/2018 0601   CALCIUM 8.8 (L) 12/29/2018 0601   AST 185 (H) 12/29/2018 0601   ALT 81 (H) 12/29/2018 0601   ALKPHOS 251 (H) 12/29/2018 0601   BILITOT 3.4 (H) 12/29/2018 0601   PROT 6.2 (L) 12/29/2018 0601   ALBUMIN 2.5 (L) 12/29/2018 0601    RADIOGRAPHIC STUDIES: Ct Head Wo Contrast  Result Date: 12/04/2018 CLINICAL DATA:  Liver carcinoma with altered mental status/confusion EXAM: CT HEAD WITHOUT CONTRAST TECHNIQUE: Contiguous axial images were obtained from the base of the skull through the vertex without intravenous contrast. COMPARISON:  None. FINDINGS: Brain: Ventricles and sulci are within normal limits for age. There is no demonstrable intracranial mass, hemorrhage, extra-axial fluid collection, or midline shift. There is slight small vessel disease in the centra semiovale bilaterally. No acute infarct evident. Vascular: No hyperdense vessel. There are foci of calcification in each carotid siphon region. Skull: The bony calvarium appears intact. No neoplastic focus involving bony structures. Sinuses/Orbits: Visualized paranasal sinuses are clear. Visualized orbits appear symmetric bilaterally. Other: Mastoid air cells are clear. IMPRESSION: Mild periventricular small vessel disease. No acute infarct. No mass or hemorrhage. There are foci of  arterial vascular calcification. Electronically Signed   By: Lowella Grip III M.D.   On: 12/10/2018 09:46   Ct Chest Wo Contrast  Result Date: 12/15/2018 CLINICAL DATA:  Hepatocellular carcinoma staging. Chest pain. Portal vein thrombosis. EXAM: CT CHEST WITHOUT CONTRAST TECHNIQUE: Multidetector CT imaging of the chest was performed following the standard protocol without IV contrast. COMPARISON:  Abdominal CT 11/05/2018.  No prior chest imaging. FINDINGS: Cardiovascular: Aortic atherosclerosis. Tortuous thoracic aorta. Borderline cardiomegaly. Multivessel coronary artery atherosclerosis. Mediastinum/Nodes: No supraclavicular adenopathy. No mediastinal or definite hilar adenopathy, given limitations of unenhanced CT. Lungs/Pleura: Trace right pleural fluid. No suspicious pulmonary nodule or mass. Areas of bilateral major fissure thickening versus small subpleural lymph nodes. A right apical subpleural 3 mm nodule on image 29/3  is also most likely a subpleural lymph node. Upper Abdomen: Cirrhosis with heterogeneity throughout the liver, better evaluated on contrast-enhanced studies. New small volume ascites since 11/05/2018. Musculoskeletal: Thoracic spondylosis. IMPRESSION: 1.  No acute process or evidence of metastatic disease in the chest. 2. Coronary artery atherosclerosis. Aortic Atherosclerosis (ICD10-I70.0). 3. Trace right pleural fluid. 4. New small volume ascites. Electronically Signed   By: Abigail Miyamoto M.D.   On: 12/15/2018 08:51   US Biopsy (liver)  Result Date: 12/06/2018 INDICATION: 73 year old female with a history liver mass EXAM: ULTRASOUND-GUIDED BIOPSY LIVER MASS MEDICATIONS: None. ANESTHESIA/SEDATION: Moderate (conscious) sedation was employed during this procedure. A total of Versed 1.5 mg and Fentanyl 75 mcg was administered intravenously. Moderate Sedation Time: 25 minutes. The patient's level of consciousness and vital signs were monitored continuously by radiology nursing  throughout the procedure under my direct supervision. FLUOROSCOPY TIME:  ULTRASOUND COMPLICATIONS: NONE PROCEDURE: Informed written consent was obtained from the patient after a thorough discussion of the procedural risks, benefits and alternatives. All questions were addressed. Maximal Sterile Barrier Technique was utilized including caps, mask, sterile gowns, sterile gloves, sterile drape, hand hygiene and skin antiseptic. A timeout was performed prior to the initiation of the procedure Ultrasound survey of the right liver lobe performed with images stored and sent to PACs. The right lower thorax/right upper abdomen was prepped with chlorhexidine in a sterile fashion, and a sterile drape was applied covering the operative field. A sterile gown and sterile gloves were used for the procedure. Local anesthesia was provided with 1% Lidocaine. Once the patient is prepped and draped sterilely and the skin and subcutaneous tissues were generously infiltrated with 1% lidocaine, a small stab incision was made with an 11 blade scalpel. A 17 gauge introducer needle was advanced under ultrasound guidance in an intercostal location into the right liver lobe, targeting ill-defined parenchyma of the liver tissue of the right liver. The stylet was removed, and multiple separate 18 gauge core biopsy were retrieved. Samples were placed into formalin for transportation to the lab. Gel-Foam slurry was then infused with a small amount of saline for assistance with hemostasis. The needle was removed, and a final ultrasound image was performed. The patient tolerated the procedure well and remained hemodynamically stable throughout. No complications were encountered and no significant blood loss was encounter. IMPRESSION: Status post ultrasound-guided biopsy of ill-defined mass of the right liver. Signed, Dulcy Fanny. Dellia Nims, RPVI [facility] Electronically Signed   By: Corrie Mckusick D.O.   On: 12/06/2018 10:17   Dg Chest Port 1 View  Result Date: 12/03/2018 CLINICAL DATA:  73 year old female with history of confusion and disorientation after initial doses of chemotherapy for liver cancer. EXAM: PORTABLE CHEST 1 VIEW COMPARISON:  No priors. FINDINGS: Lung volumes are low. Linear opacities in lung bases bilaterally favored to reflect areas of subsegmental atelectasis or scarring. No consolidative airspace disease. No pleural effusions. No evidence of pulmonary edema. Heart size is normal. The patient is rotated to the left on today's exam, resulting in distortion of the mediastinal contours and reduced diagnostic sensitivity and specificity for mediastinal pathology. Aortic atherosclerosis. IMPRESSION: 1. Low lung volumes with bibasilar areas of atelectasis and/or scarring (left greater than right). 2. Aortic atherosclerosis. Electronically Signed   By: Vinnie Langton M.D.   On: 12/21/2018 09:13   US Abdomen Limited Ruq  Result Date: 12/18/2018 CLINICAL DATA:  Hepatocellular carcinoma with tumor thrombus shown on prior imaging. Elevated liver enzymes. EXAM:  ULTRASOUND ABDOMEN LIMITED RIGHT UPPER QUADRANT COMPARISON:  11/11/2018 MRI FINDINGS: Gallbladder: Surgically absent Common bile duct: Diameter: 5 mm Liver: Heterogeneous right hepatic lobe likely due to underlying tumors and underlying tumor thrombus. There is notable thrombus in the vicinity of the right portal vein and portal vein confluence, with some reconstitution of portal venous flow or flow around the thrombus in the porta hepatis. There is some evidence of retrograde flow in parts of the portal vein before entering the liver for example on image 27 and image 28. There is also some heterogeneity in the left hepatic lobe, for example a 3.7 by 2.8 by 3.4 cm potential lesion posteriorly, and tumor involvement in the left hepatic lobe is not excluded. IMPRESSION: 1. Tumor primarily in the right hepatic lobe and possibly  in the left hepatic lobe, with ill definition. 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 There is some retrograde flow in the segment of the portal vein leading towards this thrombus as shown on image 27. 2. No significant biliary dilatation is identified. Electronically Signed   By: Van Clines M.D.   On: 12/10/2018 17:04    PERFORMANCE STATUS (ECOG) : 4 - Bedbound  Review of Systems Unless otherwise noted, a complete review of systems is negative.  Physical Exam General: Ill-appearing Cardiovascular: regular rate and rhythm Pulmonary: Unlabored Abdomen: soft, nontender, + bowel sounds Extremities: no edema Skin: no rashes Neurological: Nonverbal but wakes with stimuli.  Does not follow commands.  IMPRESSION: Follow-up visit made.  Patient remains in ICU.  Mental status appears slightly improved today.  Patient is more awake but remains confused, nonverbal, and does not follow commands.  No family currently at bedside.  Will call family today.  PLAN: -Continue current scope of treatment -We will continue to follow   Patient expressed understanding and was in agreement with this plan. She also understands that She can call the clinic at any time with any questions, concerns, or complaints.     Time Total: 15 minutes  Visit consisted of counseling and education dealing with the complex and emotionally intense issues of symptom management and palliative care in the setting of serious and potentially life-threatening illness.Greater than 50%  of this time was spent counseling and coordinating care related to the above assessment and plan.  Signed by: Altha Harm, PhD, NP-C [phone] (Work Cell)

## 2018-12-29 NOTE — Progress Notes (Signed)
Took over care of patient around 1500.  Patient very agitated and flipping in the bed-lying on belly- patient pulled out IV- IV team placed midline- morphine given and did not seem to make patient anymore comfortable.  Hinton Dyer, NP notified and Precedex ordered.  Patient's son Quita Skye is at bedside.  Patient is more relaxed at this time since the start of the Precedex infusion.

## 2018-12-29 NOTE — Progress Notes (Signed)
Hematology/Oncology Progress Note [facility] [phone] [phone]  Patient Care Team: Sharyne Peach, MD as PCP - General (Family Medicine) Bary Castilla, Forest Gleason, MD (General Surgery) Sharyne Peach, MD (Family Medicine) Clent Jacks, RN as Registered Nurse   Name of the patient: Abigail Ayers  [id number]  Oct 05, 1945  Date of visit: 12/29/18   INTERVAL HISTORY-  Patient was seen in the bedside.  Mental status status improved still confused.  Moving all her extremities.  Daughter [name] at bedside.   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   Claudication of calf muscles ([place]) 12/01/2017   Osteoarthritis of knee 06/17/2017   Nipple discharge 04/29/2013   Essential hypertension 09/17/2012     Past Medical History:  Diagnosis Date   Hypertension    not currently taking meds     Past Surgical History:  Procedure Laterality Date   BREAST BIOPSY Left    neg   CHOLECYSTECTOMY  1992    Social History   Socioeconomic History   Marital status: Widowed    Spouse name: Not on file   Number of children: Not on file   Years of education: Not on file   Highest education level: Not on file  Occupational History   Not on file  Social Needs   Financial resource strain: Not on file   Food insecurity    Worry: Not on file   Inability: Not on file   Transportation needs    Medical: Not on file    Non-medical: Not on file  Tobacco Use   Smoking status: Never Smoker   Smokeless tobacco: Never Used  Substance and Sexual Activity   Alcohol use: No   Drug use: No   Sexual activity: Not on file  Lifestyle   Physical activity    Days per week: Not on file    Minutes per session: Not on file   Stress: Not on file  Relationships   Social connections    Talks on phone: Not on file    Gets together: Not on file    Attends religious service: Not on file    Active member of club or organization: Not on file    Attends meetings of clubs or organizations: Not on file    Relationship status: Not on file   Intimate partner violence    Fear of current or ex partner: Not on file    Emotionally abused: Not on file    Physically abused: Not on file    Forced sexual activity: Not on file  Other Topics Concern   Not on file  Social History Narrative   Not on file     Family History  Problem Relation Age of Onset   Alzheimer's disease Mother    Stroke Father    Breast cancer Daughter 29       [place], genetic  test negative     Current Facility-Administered Medications:    acetaminophen (TYLENOL) tablet 650 mg, 650 mg, Oral, Q6H PRN **OR** acetaminophen (TYLENOL) suppository 650 mg, 650 mg, Rectal, Q6H PRN, Pyreddy, Pavan, MD   ceFEPIme (MAXIPIME) 2 g in sodium chloride 0.9 % 100 mL IVPB, 2 g, Intravenous, Q12H, Charlett Nose, RPH, Stopped at 12/29/18 1008   famotidine (PEPCID) IVPB 20 mg premix, 20 mg, Intravenous, QHS, Charlett Nose, RPH, Stopped at 12/28/18 2143   heparin injection 5,000 Units, 5,000 Units, Subcutaneous, Q8H, Charlett Nose, RPH, 5,000 Units at 12/29/18 1403   lactated ringers 1,000 mL with potassium chloride 20 mEq infusion, , Intravenous, Continuous, Charlett Nose, RPH, Last Rate: 100 mL/hr at 12/29/18 1359   morphine 2 MG/ML injection 1-2 mg, 1-2 mg,  Intravenous, Q4H PRN, Awilda Bill, NP   ondansetron (ZOFRAN) tablet 4 mg, 4 mg, Oral, Q6H PRN **OR** ondansetron (ZOFRAN) injection 4 mg, 4 mg, Intravenous, Q6H PRN, Pyreddy, Pavan, MD   rifaximin (XIFAXAN) tablet 550 mg, 550 mg, Oral, BID, Tahiliani, Varnita B, MD   vancomycin (VANCOCIN) 1,250 mg in sodium chloride 0.9 % 250 mL IVPB, 1,250 mg, Intravenous, Q36H, Charlett Nose, RPH, Stopped at 12/29/18 1330   Physical exam:  Vitals:   12/29/18 1100 12/29/18 1200 12/29/18 1300 12/29/18 1400  BP:  117/77    Pulse: (!) 115 (!) 112 (!) 102 80  Resp: 20 (!) 26 20 (!) 27  Temp:    98.6 F (37 C)  TempSrc:    Axillary  SpO2: 97% 99% 95% (!) 64%  Weight:      Height:       Physical Exam  Cardiovascular:  tachycardia  Pulmonary/Chest: Effort normal. No respiratory distress.  Abdominal: Soft. She exhibits no distension.  Neurological:  Confused, not following command. She moves all 4 extremities. Eyes are open.  Skin: No rash noted.       CMP Latest Ref Rng & Units 12/29/2018  Glucose 70 - 99 mg/dL 101(H)  BUN 8 - 23 mg/dL 34(H)  Creatinine 0.44 - 1.00 mg/dL 0.95  Sodium 135 - 145 mmol/L 141  Potassium 3.5 - 5.1 mmol/L 3.3(L)  Chloride 98 - 111 mmol/L 105  CO2 22 - 32 mmol/L 21(L)  Calcium 8.9 - 10.3 mg/dL 8.8(L)  Total Protein 6.5 - 8.1 g/dL 6.2(L)  Total Bilirubin 0.3 - 1.2 mg/dL 3.4(H)  Alkaline Phos 38 - 126 U/L 251(H)  AST 15 - 41 U/L 185(H)  ALT 0 - 44 U/L 81(H)   CBC Latest Ref Rng & Units 12/29/2018  WBC 4.0 - 10.5 K/uL 14.8(H)  Hemoglobin 12.0 - 15.0 g/dL 12.3  Hematocrit 36.0 - 46.0 % 39.6  Platelets 150 - 400 K/uL 259   RADIOGRAPHIC STUDIES: I have personally reviewed the radiological images as listed and agreed with the findings in the report.   Ct Head Wo Contrast  Result Date: 12/02/2018 CLINICAL DATA:  Liver carcinoma with altered mental status/confusion EXAM: CT HEAD WITHOUT CONTRAST TECHNIQUE: Contiguous axial images were obtained from the  base of the skull through the vertex without intravenous contrast. COMPARISON:  None. FINDINGS: Brain: Ventricles and sulci are within normal limits for age. There is no demonstrable intracranial mass, hemorrhage, extra-axial fluid collection, or midline shift. There is slight small vessel disease in the centra semiovale bilaterally. No acute infarct evident. Vascular: No hyperdense vessel. There are foci of calcification in each carotid siphon region. Skull: The bony calvarium appears intact. No neoplastic focus  involving bony structures. Sinuses/Orbits: Visualized paranasal sinuses are clear. Visualized orbits appear symmetric bilaterally. Other: Mastoid air cells are clear. IMPRESSION: Mild periventricular small vessel disease. No acute infarct. No mass or hemorrhage. There are foci of arterial vascular calcification. Electronically Signed   By: Lowella Grip III M.D.   On: 12/05/2018 09:46   Ct Chest Wo Contrast  Result Date: 12/15/2018 CLINICAL DATA:  Hepatocellular carcinoma staging. Chest pain. Portal vein thrombosis. EXAM: CT CHEST WITHOUT CONTRAST TECHNIQUE: Multidetector CT imaging of the chest was performed following the standard protocol without IV contrast. COMPARISON:  Abdominal CT 11/05/2018.  No prior chest imaging. FINDINGS: Cardiovascular: Aortic atherosclerosis. Tortuous thoracic aorta. Borderline cardiomegaly. Multivessel coronary artery atherosclerosis. Mediastinum/Nodes: No supraclavicular adenopathy. No mediastinal or definite hilar adenopathy, given limitations of unenhanced CT. Lungs/Pleura: Trace right pleural fluid. No suspicious pulmonary nodule or mass. Areas of bilateral major fissure thickening versus small subpleural lymph nodes. A right apical subpleural 3 mm nodule on image 29/3 is also most likely a subpleural lymph node. Upper Abdomen: Cirrhosis with heterogeneity throughout the liver, better evaluated on contrast-enhanced studies. New small volume ascites since  11/05/2018. Musculoskeletal: Thoracic spondylosis. IMPRESSION: 1.  No acute process or evidence of metastatic disease in the chest. 2. Coronary artery atherosclerosis. Aortic Atherosclerosis (ICD10-I70.0). 3. Trace right pleural fluid. 4. New small volume ascites. Electronically Signed   By: Abigail Miyamoto M.D.   On: 12/15/2018 08:51   US Biopsy (liver)  Result Date: 12/06/2018 INDICATION: 73 year old female with a history liver mass EXAM: ULTRASOUND-GUIDED BIOPSY LIVER MASS MEDICATIONS: None. ANESTHESIA/SEDATION: Moderate (conscious) sedation was employed during this procedure. A total of Versed 1.5 mg and Fentanyl 75 mcg was administered intravenously. Moderate Sedation Time: 25 minutes. The patient's level of consciousness and vital signs were monitored continuously by radiology nursing throughout the procedure under my direct supervision. FLUOROSCOPY TIME:  ULTRASOUND COMPLICATIONS: NONE PROCEDURE: Informed written consent was obtained from the patient after a thorough discussion of the procedural risks, benefits and alternatives. All questions were addressed. Maximal Sterile Barrier Technique was utilized including caps, mask, sterile gowns, sterile gloves, sterile drape, hand hygiene and skin antiseptic. A timeout was performed prior to the initiation of the procedure Ultrasound survey of the right liver lobe performed with images stored and sent to PACs. The right lower thorax/right upper abdomen was prepped with chlorhexidine in a sterile fashion, and a sterile drape was applied covering the operative field. A sterile gown and sterile gloves were used for the procedure. Local anesthesia was provided with 1% Lidocaine. Once the patient is prepped and draped sterilely and the skin and subcutaneous tissues were generously infiltrated with 1% lidocaine, a small stab incision was made with an 11 blade scalpel. A 17 gauge introducer needle was advanced under ultrasound guidance in an intercostal location into the  right liver lobe, targeting ill-defined parenchyma of the liver tissue of the right liver. The stylet was removed, and multiple separate 18 gauge core biopsy were retrieved. Samples were placed into formalin for transportation to the lab. Gel-Foam slurry was then infused with a small amount of saline for assistance with hemostasis. The needle was removed, and a final ultrasound image was performed. The patient tolerated the procedure well and remained hemodynamically stable throughout. No complications were encountered and no significant blood loss was encounter. IMPRESSION: Status post ultrasound-guided biopsy of ill-defined mass of the right liver. Signed, Dulcy Fanny. Dellia Nims, [facility] Radiology Electronically Signed   By: Corrie Mckusick D.O.  On: 12/06/2018 10:17   Dg Chest Port 1 View  Result Date: 12/26/2018 CLINICAL DATA:  73 year old female with history of confusion and disorientation after initial doses of chemotherapy for liver cancer. EXAM: PORTABLE CHEST 1 VIEW COMPARISON:  No priors. FINDINGS: Lung volumes are low. Linear opacities in lung bases bilaterally favored to reflect areas of subsegmental atelectasis or scarring. No consolidative airspace disease. No pleural effusions. No evidence of pulmonary edema. Heart size is normal. The patient is rotated to the left on today's exam, resulting in distortion of the mediastinal contours and reduced diagnostic sensitivity and specificity for mediastinal pathology. Aortic atherosclerosis. IMPRESSION: 1. Low lung volumes with bibasilar areas of atelectasis and/or scarring (left greater than right). 2. Aortic atherosclerosis. Electronically Signed   By: Vinnie Langton M.D.   On: 12/03/2018 09:13   US Abdomen Limited Ruq  Result Date: 12/25/2018 CLINICAL DATA:  Hepatocellular carcinoma with tumor thrombus shown on prior imaging. Elevated liver enzymes. EXAM: ULTRASOUND ABDOMEN LIMITED RIGHT UPPER  QUADRANT COMPARISON:  11/11/2018 MRI FINDINGS: Gallbladder: Surgically absent Common bile duct: Diameter: 5 mm Liver: Heterogeneous right hepatic lobe likely due to underlying tumors and underlying tumor thrombus. There is notable thrombus in the vicinity of the right portal vein and portal vein confluence, with some reconstitution of portal venous flow or flow around the thrombus in the porta hepatis. There is some evidence of retrograde flow in parts of the portal vein before entering the liver for example on image 27 and image 28. There is also some heterogeneity in the left hepatic lobe, for example a 3.7 by 2.8 by 3.4 cm potential lesion posteriorly, and tumor involvement in the left hepatic lobe is not excluded. IMPRESSION: 1. Tumor primarily in the right hepatic lobe and possibly in the left hepatic lobe, with ill definition. 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 There is some retrograde flow in the segment of the portal vein leading towards this thrombus as shown on image 27. 2. No significant biliary dilatation is identified. Electronically Signed   By: Van Clines M.D.   On: 12/10/2018 17:04    Assessment and plan-  Patient is a 73 y.o. female with newly diagnosed Impact, just started on first cycle of Tecentriq plus Avastin currently presents with altered mental status.    #Sepsis/lactic acidosis.  Patient did have elevated procalcitonin.  Urine culture is negative, blood culture no growth for 2 days. MRSA negativeCOVID-19 at admission was negative. Prior to her ER presentation, she has had nausea which relieved by antiemetics.  Developed diarrhea. She has got lactulose due to high ammonia level.  Lactulose has stopped. Per bedside RN, patient still have ongoing diarrhea. I discussed with Dr. Lanney Gins, recommend checking C. difficile, GI virus panel.  Appreciate GI recommendation. If infectious etiology is ruled  out, I recommend treating for possible immunotherapy induced colitis-consider Solu-Medrol 2mg /kg.   #Altered mental status likely multifactorial.  Due to hepatic encephalopathy as well as dehydration/infection. I will check TSH, free T4, morning cortisol level and ACTH level.  Rule out immunotherapy related hypothyroidism, adrenal insufficiency or hypophysitis  #Acute renal failure, creatinine function resolving. #HCC, status post 1 cycle of Tecentriq and Avastin.  Hold additional chemotherapy due to acute issue. #Discussed with daughter [name] at the bedside.  Goal of care reviewed. Palliative care on board.  Thank you for allowing me to participate in the care of this patient.  I spent sufficient time with daughter to discuss many  aspect of care, questions were answered  Total face to face encounter time for this patient visit was 35 min. >50% of the time was  spent in counseling and coordination of care.   Earlie Server, MD, PhD Hematology Oncology [facility] Pager- [phone] 12/29/2018

## 2018-12-29 NOTE — Progress Notes (Signed)
Abigail Ayers NAME: Abigail Ayers    MR#:  [id number]  DATE OF BIRTH:  01/10/1946  SUBJECTIVE:  CHIEF COMPLAINT:   Chief Complaint  Patient presents with  . Altered Mental Status   Awake.  Nonverbal.  Afebrile.   MItts on  REVIEW OF SYSTEMS:    Review of Systems  Unable to perform ROS: Mental status change    DRUG ALLERGIES:   Allergies  Allergen Reactions  . Lisinopril Other (See Comments)    Other reaction(s): Cough    VITALS:  Blood pressure 109/73, pulse (!) 115, temperature 98.7 F (37.1 C), temperature source Axillary, resp. rate 20, height 5' 0.5" (1.537 m), weight 74.9 kg, SpO2 97 %.  PHYSICAL EXAMINATION:   Physical Exam  GENERAL:  73 y.o.-year-old patient lying in the bed with no acute distress.  EYES: Pupils equal, round, reactive to light and accommodation. No scleral icterus. Extraocular muscles intact.  HEENT: Head atraumatic, normocephalic. Oropharynx and nasopharynx clear.  NECK:  Supple, no jugular venous distention. No thyroid enlargement, no tenderness.  LUNGS: Normal breath sounds bilaterally, no wheezing, rales, rhonchi. No use of accessory muscles of respiration.  CARDIOVASCULAR: S1, S2 normal. No murmurs, rubs, or gallops.  ABDOMEN: Soft, nontender, nondistended. Bowel sounds present. No organomegaly or mass.  EXTREMITIES: No cyanosis, clubbing or edema b/l.    NEUROLOGIC: Not following instructions but moves all 4 extremities PSYCHIATRIC: The patient is .  Awake but nonverbal and confused SKIN: No obvious rash, lesion, or ulcer.   LABORATORY PANEL:   CBC Recent Labs  Lab 12/29/18 0601  WBC 14.8*  HGB 12.3  HCT 39.6  PLT 259   ------------------------------------------------------------------------------------------------------------------ Chemistries  Recent Labs  Lab 12/28/18 0324 12/29/18 0601  NA 140 141  K 3.7 3.3*  CL 105 105  CO2 21* 21*  GLUCOSE 116* 101*  BUN 44*  34*  CREATININE 1.59* 0.95  CALCIUM 8.6* 8.8*  MG 2.5*  --   AST 419* 185*  ALT 103* 81*  ALKPHOS 222* 251*  BILITOT 2.8* 3.4*   ------------------------------------------------------------------------------------------------------------------  Cardiac Enzymes No results for input(s): TROPONINI in the last 168 hours. ------------------------------------------------------------------------------------------------------------------  RADIOLOGY:  US Abdomen Limited Ruq  Result Date: 12/08/2018 CLINICAL DATA:  Hepatocellular carcinoma with tumor thrombus shown on prior imaging. Elevated liver enzymes. EXAM: ULTRASOUND ABDOMEN LIMITED RIGHT UPPER QUADRANT COMPARISON:  11/11/2018 MRI FINDINGS: Gallbladder: Surgically absent Common bile duct: Diameter: 5 mm Liver: Heterogeneous right hepatic lobe likely due to underlying tumors and underlying tumor thrombus. There is notable thrombus in the vicinity of the right portal vein and portal vein confluence, with some reconstitution of portal venous flow or flow around the thrombus in the porta hepatis. There is some evidence of retrograde flow in parts of the portal vein before entering the liver for example on image 27 and image 28. There is also some heterogeneity in the left hepatic lobe, for example a 3.7 by 2.8 by 3.4 cm potential lesion posteriorly, and tumor involvement in the left hepatic lobe is not excluded. IMPRESSION: 1. Tumor primarily in the right hepatic lobe and possibly in the left hepatic lobe, with ill definition. 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 There is some retrograde flow in the segment of the portal vein leading towards this thrombus as shown on image 27. 2. No significant biliary dilatation is identified. Electronically Signed   By: Cindra Eves.D.  On: 12/22/2018 17:04     ASSESSMENT AND PLAN:   73 year old female with a known history of  hypertension, hepatocellular carcinoma presented to the emergency room for confusion  Patient is altered.   -Acute hepatic encephalopathy Lactulose.  Ammonia level has trended down Intensivist and gastroenterology evaluation  -Sepsis Unknown etiology Follow-up cultures and lactic acid level IV fluids Broad-spectrum antibiotics Cultures no growth to date  -Hepatocellular carcinoma Oncology follow-up Poor prognosis  -Dehydration IV fluids  -Abnormal LFT secondary to hepatocellular carcinoma  -Acute kidney injury IV fluid hydration Resolved  -DVT prophylaxis Subcutaneous Lovenox daily  If no improvement will benefit from transition to hospice home.  All the records are reviewed and case discussed with Care Management/Social Worker Management plans discussed with the patient, family and they are in agreement.  CODE STATUS: FULL CODE  TOTAL TIME TAKING CARE OF THIS PATIENT: 35 minutes.   POSSIBLE D/C IN 3-4 DAYS, DEPENDING ON CLINICAL CONDITION.  Abigail Ayers M.D on 12/29/2018 at 12:21 PM  Between 7am to 6pm - Pager - [phone]  After 6pm go to [email or web address] - password [email or web address] [facility]  Office  [phone]  CC: Primary care physician; Sharyne Peach, MD  Note: This dictation was prepared with Dragon dictation along with smaller phrase technology. Any transcriptional errors that result from this process are unintentional.

## 2018-12-29 NOTE — Progress Notes (Signed)
Pharmacy Electrolyte Monitoring Consult:  Pharmacy consulted to assist in monitoring and replacing electrolytes in this 73 y.o. female admitted on 12/10/2018 with Altered Mental Status   Labs:  Sodium (mmol/L)  Date Value  12/29/2018 141   Potassium (mmol/L)  Date Value  12/29/2018 3.3 (L)   Magnesium (mg/dL)  Date Value  12/28/2018 2.5 (H)   Phosphorus (mg/dL)  Date Value  12/26/2018 2.7   Calcium (mg/dL)  Date Value  12/29/2018 8.8 (L)   Albumin (g/dL)  Date Value  12/29/2018 2.5 (L)    Assessment/Plan: LR transitioned to LR/Potassium 20mEq at 133mL/hr.   Will obtain electrolytes with am labs.   Pharmacy will continue to monitor and adjust per consult.    Simpson,Michael L 12/29/2018 4:55 PM

## 2018-12-29 NOTE — Progress Notes (Signed)
Abigail Antigua, MD [address], [place], [place], [place], [address] [address], [place], [place], [place], [address] Phone: [phone]  Fax: [phone]   Subjective: Pt remains confused but somewhat improved today. No N/V.    Objective: Exam: Vital signs in last 24 hours: Vitals:   12/29/18 1100 12/29/18 1200 12/29/18 1300 12/29/18 1400  BP:  117/77    Pulse: (!) 115 (!) 112 (!) 102 80  Resp: 20 (!) 26 20 (!) 27  Temp:    98.6 F (37 C)  TempSrc:    Axillary  SpO2: 97% 99% 95% (!) 64%  Weight:      Height:       Weight change:   Intake/Output Summary (Last 24 hours) at 12/29/2018 1720 Last data filed at 12/29/2018 1406 Gross per 24 hour  Intake 2367.11 ml  Output 660 ml  Net 1707.11 ml    General: No acute distress, Abd: Soft, NT/ND, No HSM Skin: Warm, no rashes Neck: Supple, Trachea midline   Lab Results: Lab Results  Component Value Date   WBC 14.8 (H) 12/29/2018   HGB 12.3 12/29/2018   HCT 39.6 12/29/2018   MCV 77.0 (L) 12/29/2018   PLT 259 12/29/2018   Micro Results: Recent Results (from the past 240 hour(s))  Novel Coronavirus, NAA ([facility])     Status: None   Collection Time: 12/20/18 10:18 AM  Result Value Ref Range Status   SARS-CoV-2, NAA Not Detected Not Detected Final    Comment: This test was developed and its performance characteristics determined by [employer]. This test has not been FDA cleared or approved. This test has been authorized by FDA under an Emergency Use Authorization (EUA). This test is only authorized for the duration of time the declaration that circumstances exist justifying the authorization of the emergency use of in vitro diagnostic tests for detection of SARS-CoV-2 virus and/or diagnosis of COVID-19 infection under section 564(b)(1) of the Act, 21 U.S.C. 381OFB-5(Z)(0), unless the authorization is terminated or revoked sooner. When diagnostic testing is negative, the possibility of a false  negative result should be considered in the context of a patient's recent exposures and the presence of clinical signs and symptoms consistent with COVID-19. An individual without symptoms of COVID-19 and who is not shedding SARS-CoV-2 virus would expect to have a negative (not detected) result in this assay.   Blood culture (routine x 2)     Status: None (Preliminary result)   Collection Time: 12/28/2018  9:40 AM   Specimen: BLOOD  Result Value Ref Range Status   Specimen Description BLOOD LEFT ANTECUBITAL  Final   Special Requests   Final    BOTTLES DRAWN AEROBIC AND ANAEROBIC Blood Culture adequate volume   Culture   Final    NO GROWTH 2 DAYS Performed at [facility], [address], [place], [place] [address]    Report Status PENDING  Incomplete  Blood culture (routine x 2)     Status: None (Preliminary result)   Collection Time: 12/23/2018  9:51 AM   Specimen: BLOOD LEFT HAND  Result Value Ref Range Status   Specimen Description BLOOD LEFT HAND  Final   Special Requests   Final    BOTTLES DRAWN AEROBIC AND ANAEROBIC Blood Culture results may not be optimal due to an inadequate volume of blood received in culture bottles   Culture   Final    NO GROWTH 2 DAYS Performed at [facility], [address] [place], [place]  [address]    Report Status PENDING  Incomplete  SARS Coronavirus 2 (CEPHEID - Performed in [facility] hospital lab), Hosp Order     Status: None   Collection Time: 12/16/2018 10:24 AM   Specimen: Nasopharyngeal Swab  Result Value Ref Range Status   SARS Coronavirus 2 NEGATIVE NEGATIVE Final    Comment: (NOTE) If result is NEGATIVE SARS-CoV-2 target nucleic acids are NOT DETECTED. The SARS-CoV-2 RNA is generally detectable in upper and lower  respiratory specimens during the acute phase of infection. The lowest  concentration of SARS-CoV-2 viral copies this assay can detect is 250  copies / mL. A negative result does not preclude SARS-CoV-2  infection  and should not be used as the sole basis for treatment or other  patient management decisions.  A negative result may occur with  improper specimen collection / handling, submission of specimen other  than nasopharyngeal swab, presence of viral mutation(s) within the  areas targeted by this assay, and inadequate number of viral copies  (<250 copies / mL). A negative result must be combined with clinical  observations, patient history, and epidemiological information. If result is POSITIVE SARS-CoV-2 target nucleic acids are DETECTED. The SARS-CoV-2 RNA is generally detectable in upper and lower  respiratory specimens dur ing the acute phase of infection.  Positive  results are indicative of active infection with SARS-CoV-2.  Clinical  correlation with patient history and other diagnostic information is  necessary to determine patient infection status.  Positive results do  not rule out bacterial infection or co-infection with other viruses. If result is PRESUMPTIVE POSTIVE SARS-CoV-2 nucleic acids MAY BE PRESENT.   A presumptive positive result was obtained on the submitted specimen  and confirmed on repeat testing.  While 2019 novel coronavirus  (SARS-CoV-2) nucleic acids may be present in the submitted sample  additional confirmatory testing may be necessary for epidemiological  and / or clinical management purposes  to differentiate between  SARS-CoV-2 and other Sarbecovirus currently known to infect humans.  If clinically indicated additional testing with an alternate test  methodology [phone]) is advised. The SARS-CoV-2 RNA is generally  detectable in upper and lower respiratory sp ecimens during the acute  phase of infection. The expected result is Negative. Fact Sheet for Patients:  StrictlyIdeas.no Fact Sheet for Healthcare Providers: BankingDealers.co.za This test is not yet approved or cleared by the [place]  FDA and has been authorized for detection and/or diagnosis of SARS-CoV-2 by FDA under an Emergency Use Authorization (EUA).  This EUA will remain in effect (meaning this test can be used) for the duration of the COVID-19 declaration under Section 564(b)(1) of the Act, 21 U.S.C. section 360bbb-3(b)(1), unless the authorization is terminated or revoked sooner. Performed at [facility], [address] [place], [place] [address]   MRSA PCR Screening     Status: None   Collection Time: 12/05/2018  3:29 PM   Specimen: Nasal Mucosa; Nasopharyngeal  Result Value Ref Range Status   MRSA by PCR NEGATIVE NEGATIVE Final    Comment:        The GeneXpert MRSA Assay (FDA approved for NASAL specimens only), is one component of a comprehensive MRSA colonization surveillance program. It is not intended to diagnose MRSA infection nor to guide or monitor treatment for MRSA infections. Performed at [facility], [address], [place], [place] [address]   Urine culture     Status: None   Collection Time: 12/15/2018  3:40 PM   Specimen: Urine,  Random  Result Value Ref Range Status   Specimen Description   Final    URINE, RANDOM Performed at [facility], [address] [place], [place] [address]    Special Requests   Final    NONE Performed at [facility], [address], [place], [place] [address]    Culture   Final    NO GROWTH Performed at [facility], [place] [address] [place], [place] [address]    Report Status 12/28/2018 FINAL  Final   Studies/Results: No results found. Medications:  Scheduled Meds: . heparin injection (subcutaneous)  5,000 Units Subcutaneous Q8H  . rifaximin  550 mg Oral BID   Continuous Infusions: . ceFEPime (MAXIPIME) IV Stopped (12/29/18 1008)  . famotidine (PEPCID) IV Stopped (12/28/18 2143)  . lactated ringers with kcl 100 mL/hr at 12/29/18 1359  . vancomycin Stopped (12/29/18 1330)   PRN  Meds:.acetaminophen **OR** acetaminophen, morphine injection, ondansetron **OR** ondansetron (ZOFRAN) IV   Assessment: Active Problems:   Sepsis ([place])   Altered mental status   Dehydration   Hyperammonemia (HCC)   Elevated liver enzymes   Palliative care encounter    Plan: Transaminases have improved Bilirubin is elevated at baseline due to [facility], but is slightly more elevated today.   If bili continues to worsen, pt may need MRCP. However, [place] did not show CBD obstruction or dilation. Given her confusion MRI may not be the best study at this time as movement of the pt will significantly affect the results. Therefore, will wait to order if labs worsen.   Stool workup pending for diarrhea  Lactulose d/c'd  Continue rifaximin  Acutely elevated ferritin is likely due to acute phase reactant.   Hep C RNA pending. HCV Ab positive due to previous hx of Hep C (treated with Harvoni in the past)     LOS: 2 days   Abigail Antigua, MD 12/29/2018, 5:20 PM

## 2018-12-29 NOTE — Consult Note (Signed)
Pharmacy Antibiotic Note  Abigail Ayers is a 73 y.o. female admitted on 12/14/2018 with sepsis.  Pharmacy has been consulted for Cefepime and Vancomycin dosing. Patient currently suffering from AKI(baseline<1). Doses have been adjusted accordingly.  Plan: 1) Vancomycin 1250mg  IV Q36hr.   2) Cefepime 2g Q12 hours   Height: 5' 0.5" (153.7 cm) Weight: 165 lb 2 oz (74.9 kg) IBW/kg (Calculated) : 46.65  Temp (24hrs), Avg:98.8 F (37.1 C), Min:98.6 F (37 C), Max:99 F (37.2 C)  Recent Labs  Lab 12/23/18 0815 12/24/2018 0940 12/08/2018 1427 12/28/18 0324 12/29/18 0601  WBC 11.1* 17.5*  --  16.6* 14.8*  CREATININE 0.70 3.28*  --  1.59* 0.95  LATICACIDVEN  --  5.6* 4.3*  --   --   VANCORANDOM  --   --   --  16  --     Estimated Creatinine Clearance: 49.7 mL/min (by C-G formula based on SCr of 0.95 mg/dL).    Allergies  Allergen Reactions  . Lisinopril Other (See Comments)    Other reaction(s): Cough    Antimicrobials this admission: Vancomycin 6/29 >>   Cefepime 6/29 >> Flagyl 6/29 x 1   Microbiology results: 6/29 BCx: no growth x 2 days  6/29 UCx: no growth  6/29 COVID: negative  6/29 MRSA: negative   Thank you for allowing pharmacy to be a part of this patient's care.  Alcario Tinkey L 12/29/2018 4:54 PM

## 2018-12-29 NOTE — Progress Notes (Signed)
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    Had meeting with daughter at bedside today, discussed care plan and answered questions.   PAST MEDICAL HISTORY   Past Medical History:  Diagnosis Date  . Hypertension    not currently taking meds     SURGICAL HISTORY   Past Surgical History:  Procedure Laterality Date  . BREAST BIOPSY Left    neg  . CHOLECYSTECTOMY  1992     FAMILY HISTORY   Family History  Problem Relation Age of Onset  . Alzheimer's disease Mother   . Stroke Father   . Breast cancer Daughter 66       [place], genetic test negative     SOCIAL HISTORY   Social History   Tobacco Use  . Smoking status: Never Smoker  . Smokeless tobacco: Never Used  Substance Use Topics  . Alcohol use: No  . Drug use: No     MEDICATIONS   Current Medication:  Current Facility-Administered Medications:  .  acetaminophen (TYLENOL) tablet 650 mg, 650 mg, Oral, Q6H PRN **OR** acetaminophen (TYLENOL) suppository 650 mg, 650 mg, Rectal, Q6H PRN, Pyreddy, Pavan, MD .  ceFEPIme (MAXIPIME) 1 g in sodium chloride 0.9 % 100 mL IVPB, 1 g, Intravenous, Q12H, Charlett Nose, RPH, Stopped at 12/29/18 0636 .  famotidine (PEPCID) IVPB 20 mg premix, 20 mg, Intravenous, QHS, Charlett Nose, RPH, Stopped at 12/29/18 0540 .  heparin injection 5,000 Units, 5,000 Units, Subcutaneous, Q8H, Charlett Nose, RPH, 5,000 Units at 12/29/18 0505 .  lactated ringers 1,000  mL with potassium chloride 20 mEq infusion, , Intravenous, Continuous, Charlett Nose, RPH .  morphine 2 MG/ML injection 1 mg, 1 mg, Intravenous, Q3H PRN, [name] D, NP, 1 mg at 12/29/18 0602 .  ondansetron (ZOFRAN) tablet 4 mg, 4 mg, Oral, Q6H PRN **OR** ondansetron (ZOFRAN) injection 4 mg, 4 mg, Intravenous, Q6H PRN, Pyreddy, Pavan, MD .  rifaximin (XIFAXAN) tablet 550 mg, 550 mg, Oral, BID, Tahiliani, Varnita B, MD .  vancomycin variable dose per unstable renal function (pharmacist dosing), , Does not apply, See admin instructions, Brantley Stage, Walid A, RPH    ALLERGIES   Lisinopril    REVIEW OF SYSTEMS     Unable to obtain due to obtunded state and lethargy  PHYSICAL EXAMINATION   Vitals:   12/29/18 0600 12/29/18 0700  BP: (!) 142/87   Pulse: (!) 119 (!) 105  Resp: 16 13  Temp:    SpO2: 96% 96%    GENERAL: Age-appropriate patient with altered mental status HEAD: Normocephalic, atraumatic.  EYES: Pupils equal, round, reactive to light.  No scleral icterus.  MOUTH: Moist mucosal membrane. NECK: Supple. No thyromegaly. No nodules. No JVD.  PULMONARY: Mild bibasilar crackles CARDIOVASCULAR: S1 and S2. Regular rate and rhythm. No murmurs, rubs, or gallops.  GASTROINTESTINAL: Soft, nontender, non-distended. No masses. Positive bowel sounds. No hepatosplenomegaly.  MUSCULOSKELETAL: No swelling, clubbing, or edema.  NEUROLOGIC: Mild distress due to acute illness SKIN:intact,warm,dry   LABS AND IMAGING     LAB RESULTS: Recent Labs  Lab 12/26/2018 0940 12/28/18 0324 12/29/18 0601  NA 135 140 141  K 3.6  3.7 3.3*  CL 95* 105 105  CO2 17* 21* 21*  BUN 50* 44* 34*  CREATININE 3.28* 1.59* 0.95  GLUCOSE 122* 116* 101*   Recent Labs  Lab 12/05/2018 0940 12/28/18 0324 12/29/18 0601  HGB 12.7 11.9* 12.3  HCT 40.6 38.4 39.6  WBC 17.5* 16.6* 14.8*  PLT 395 311 259     IMAGING RESULTS: No results found.    ASSESSMENT AND PLAN    -Multidisciplinary  rounds held today  Altered mental status with lethargy  - likely due to uremic encephalopathy, possible septic encephalopathy with concomitant hepatic encephalopathy.   - stopping lactulose due to high volume diarreah and significant improvement in venous ammonia level - fluid resuscitation and empiric IV antibiotics - on immunotherapy with Avastin and Tecentriq which can predispose to infection and extensive cytolysis for bulky tumors including HCC.  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 Tasia Catchings oncology-appreciate input -Status post treatment with Tecentriq and Avastin -Hep C antibody positive RNA in progress    Acute Kidney Injury - KDIGO stage 3  - noted UA  - d/c non-essential nephrotoxic medications  - suspect partially due to transient hypotension  - renal US   - urine electrolytes      Probable sepsis -Blood culture x2 -will consider LP -on immunotherapy predisposing to infection 1 -Procalcitonin elevated >4, trending -use vasopressors to keep MAP>65 -follow ABG and LA -follow up cultures-no growth x2 days -emperic ABX- cefepime, vancomycin, rifaximin -consider stress dose steroids -noted elevated lactate, trending    Transaminitis -Possibly due to transient hypotension, hepatic congestion in lieu of extensive thormbosis of portal venous circulation, HCC itself, and with component possibly related to recent immunotherapy  -DC nonessential essential hepatotoxic  medications and IV resuscitation -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 -GI on case-appreciate input   GI/Nutrition GI PROPHYLAXIS as indicated DIET-->TF's as tolerated Constipation protocol as indicated   ENDO - ICU hypoglycemic\Hyperglycemia protocol -check FSBS per protocol   ELECTROLYTES -follow labs as needed -replace as needed -pharmacy consultation   DVT/GI PRX ordered -SCDs  TRANSFUSIONS AS NEEDED MONITOR FSBS ASSESS the need for LABS as needed   Critical care provider statement:    Critical care time (minutes):  33   Critical care time was exclusive of:  Separately billable procedures and treating other patients   Critical care was necessary to treat or prevent imminent or life-threatening deterioration of the following conditions:   Acute mental status with lethargy, transaminitis, newly diagnosed hepatocellular carcinoma, extensive venous portal thrombosis, acute kidney injury stage III, probable sepsis, multiple comorbid conditions   Critical care was time spent personally by me on the following activities:  Development of treatment plan with patient or surrogate, discussions with consultants, evaluation of patient's response to treatment, examination of patient, obtaining history from patient or surrogate, ordering and performing treatments and interventions, ordering and review of laboratory studies and re-evaluation of patient's condition.  I assumed direction of critical care for this patient from another provider in my specialty: no    This document was prepared  using Systems analyst and may include unintentional dictation errors.    Ottie Glazier, M.D.  Division of [place]

## 2018-12-29 DEATH — deceased

## 2018-12-30 ENCOUNTER — Encounter: Payer: Medicare Other | Admitting: Hospice and Palliative Medicine

## 2018-12-30 ENCOUNTER — Inpatient Hospital Stay: Payer: Medicare Other | Admitting: Oncology

## 2018-12-30 ENCOUNTER — Other Ambulatory Visit: Payer: Medicare Other

## 2018-12-30 ENCOUNTER — Ambulatory Visit: Payer: Medicare Other | Admitting: Oncology

## 2018-12-30 ENCOUNTER — Inpatient Hospital Stay: Payer: Medicare Other | Admitting: Hospice and Palliative Medicine

## 2018-12-30 LAB — GASTROINTESTINAL PANEL BY PCR, STOOL (REPLACES STOOL CULTURE)

## 2018-12-30 LAB — GLUCOSE, CAPILLARY
Glucose-Capillary: 67 mg/dL — ABNORMAL LOW (ref 70–99)
Glucose-Capillary: 98 mg/dL (ref 70–99)
Glucose-Capillary: 90 mg/dL (ref 70–99)
Glucose-Capillary: 122 mg/dL — ABNORMAL HIGH (ref 70–99)

## 2018-12-30 LAB — CBC
HCT: 35 % — ABNORMAL LOW (ref 36.0–46.0)
Hemoglobin: 10.6 g/dL — ABNORMAL LOW (ref 12.0–15.0)
MCH: 23.6 pg — ABNORMAL LOW (ref 26.0–34.0)
MCHC: 30.3 g/dL (ref 30.0–36.0)
MCV: 77.8 fL — ABNORMAL LOW (ref 80.0–100.0)
Platelets: 237 10*3/uL (ref 150–400)
RBC: 4.5 MIL/uL (ref 3.87–5.11)
RDW: 21.2 % — ABNORMAL HIGH (ref 11.5–15.5)
WBC: 15.9 10*3/uL — ABNORMAL HIGH (ref 4.0–10.5)
nRBC: 2.3 % — ABNORMAL HIGH (ref 0.0–0.2)

## 2018-12-30 LAB — AMMONIA: Ammonia: 14 umol/L (ref 9–35)

## 2018-12-30 LAB — COMPREHENSIVE METABOLIC PANEL
ALT: 64 U/L — ABNORMAL HIGH (ref 0–44)
AST: 111 U/L — ABNORMAL HIGH (ref 15–41)
Albumin: 2.4 g/dL — ABNORMAL LOW (ref 3.5–5.0)
Alkaline Phosphatase: 227 U/L — ABNORMAL HIGH (ref 38–126)
Anion gap: 13 (ref 5–15)
BUN: 36 mg/dL — ABNORMAL HIGH (ref 8–23)
CO2: 20 mmol/L — ABNORMAL LOW (ref 22–32)
Calcium: 8.6 mg/dL — ABNORMAL LOW (ref 8.9–10.3)
Chloride: 111 mmol/L (ref 98–111)
Creatinine, Ser: 0.82 mg/dL (ref 0.44–1.00)
GFR calc Af Amer: >60 mL/min (ref >60)
GFR calc non Af Amer: >60 mL/min (ref >60)
Glucose, Bld: 98 mg/dL (ref 70–99)
Potassium: 3 mmol/L — ABNORMAL LOW (ref 3.5–5.1)
Sodium: 144 mmol/L (ref 135–145)
Total Bilirubin: 2.7 mg/dL — ABNORMAL HIGH (ref 0.3–1.2)
Total Protein: 6 g/dL — ABNORMAL LOW (ref 6.5–8.1)

## 2018-12-30 LAB — MAGNESIUM: Magnesium: 2.6 mg/dL — ABNORMAL HIGH (ref 1.7–2.4)

## 2018-12-30 LAB — T4, FREE: Free T4: 1.16 ng/dL — ABNORMAL HIGH (ref 0.61–1.12)

## 2018-12-30 LAB — CORTISOL: Cortisol, Plasma: 32.9 ug/dL

## 2018-12-30 LAB — PROCALCITONIN: Procalcitonin: 1.37 ng/mL

## 2018-12-30 LAB — TSH: TSH: 1.609 u[IU]/mL (ref 0.350–4.500)

## 2018-12-30 LAB — POTASSIUM: Potassium: 4 mmol/L (ref 3.5–5.1)

## 2018-12-30 LAB — PHOSPHORUS: Phosphorus: 3.3 mg/dL (ref 2.5–4.6)

## 2018-12-30 MED ORDER — POTASSIUM CHLORIDE 10 MEQ/100ML IV SOLN
10.0000 meq | INTRAVENOUS | Status: AC
  Administered 2018-12-30 (×4): 10 meq via INTRAVENOUS
  Filled 2018-12-30 (×4): qty 100

## 2018-12-30 MED ORDER — SIMETHICONE 80 MG PO CHEW: 80.0000 mg | CHEWABLE_TABLET | Freq: Four times a day (QID) | ORAL | Status: DC | PRN

## 2018-12-30 MED ORDER — FENTANYL CITRATE (PF) 100 MCG/2ML IJ SOLN
12.5000 ug | INTRAMUSCULAR | Status: DC | PRN
  Administered 2018-12-30: 12.5 ug via INTRAVENOUS
  Filled 2018-12-30: qty 2

## 2018-12-30 MED ORDER — HALOPERIDOL LACTATE 5 MG/ML IJ SOLN: 2.0000 mg | Freq: Once | INTRAMUSCULAR | Status: DC

## 2018-12-30 MED ORDER — POTASSIUM CHLORIDE 2 MEQ/ML IV SOLN
INTRAVENOUS | Status: DC
  Administered 2018-12-30: 11:00:00 via INTRAVENOUS
  Filled 2018-12-30 (×5): qty 1000

## 2018-12-30 MED ORDER — PIPERACILLIN-TAZOBACTAM 3.375 G IVPB
3.3750 g | Freq: Three times a day (TID) | INTRAVENOUS | Status: DC
  Administered 2018-12-30 – 2019-01-04 (×14): 3.375 g via INTRAVENOUS
  Filled 2018-12-30 (×14): qty 50

## 2018-12-30 MED ORDER — SIMETHICONE 80 MG PO CHEW
80.0000 mg | CHEWABLE_TABLET | Freq: Four times a day (QID) | ORAL | Status: DC | PRN
  Administered 2019-01-04: 80 mg via ORAL
  Filled 2018-12-30 (×3): qty 1

## 2018-12-30 MED ORDER — PANTOPRAZOLE SODIUM 40 MG IV SOLR
40.0000 mg | Freq: Two times a day (BID) | INTRAVENOUS | Status: DC
  Administered 2018-12-30 – 2019-01-06 (×14): 40 mg via INTRAVENOUS
  Filled 2018-12-30 (×14): qty 40

## 2018-12-30 MED ORDER — DEXTROSE IN LACTATED RINGERS 5 % IV SOLN
INTRAVENOUS | Status: DC
  Administered 2018-12-30 – 2019-01-06 (×11): via INTRAVENOUS

## 2018-12-30 MED ORDER — MORPHINE SULFATE (PF) 2 MG/ML IV SOLN
1.0000 mg | INTRAVENOUS | Status: DC | PRN
  Filled 2018-12-30: qty 1

## 2018-12-30 MED ORDER — FENTANYL 12 MCG/HR TD PT72
1.0000 | MEDICATED_PATCH | TRANSDERMAL | Status: DC
  Administered 2018-12-30: 1 via TRANSDERMAL
  Filled 2018-12-30: qty 1

## 2018-12-30 MED ORDER — DEXTROSE 50 % IV SOLN
INTRAVENOUS | Status: AC
  Administered 2018-12-30: 16:00:00
  Filled 2018-12-30: qty 50

## 2018-12-30 MED ORDER — DEXTROSE 5 % IV SOLN: INTRAVENOUS | Status: DC

## 2018-12-30 MED ORDER — HALOPERIDOL LACTATE 5 MG/ML IJ SOLN
INTRAMUSCULAR | Status: AC
  Filled 2018-12-30: qty 1

## 2018-12-30 MED ORDER — MORPHINE SULFATE (PF) 2 MG/ML IV SOLN
1.0000 mg | INTRAVENOUS | Status: DC | PRN
  Administered 2018-12-30: 1 mg via INTRAVENOUS

## 2018-12-30 NOTE — Progress Notes (Signed)
Pharmacy Electrolyte Monitoring Consult:  Pharmacy consulted to assist in monitoring and replacing electrolytes in this 73 y.o. female admitted on 12/05/2018 with Altered Mental Status   Labs:  Sodium (mmol/L)  Date Value  12/30/2018 144   Potassium (mmol/L)  Date Value  12/30/2018 4.0   Magnesium (mg/dL)  Date Value  12/30/2018 2.6 (H)   Phosphorus (mg/dL)  Date Value  12/30/2018 3.3   Calcium (mg/dL)  Date Value  12/30/2018 8.6 (L)   Albumin (g/dL)  Date Value  12/30/2018 2.4 (L)    Assessment/Plan: D5/LR at 149mL/hr.   Will obtain electrolytes with am labs.   Pharmacy will continue to monitor and adjust per consult.    Simpson,Michael L 12/30/2018 4:34 PM

## 2018-12-30 NOTE — Progress Notes (Signed)
[place] at [place] NAME: Abigail Ayers    MR#:  [id number]  DATE OF BIRTH:  1946/03/17  SUBJECTIVE:  CHIEF COMPLAINT:   Chief Complaint  Patient presents with  . Altered Mental Status   Restless. Non verbal  REVIEW OF SYSTEMS:    Review of Systems  Unable to perform ROS: Mental status change    DRUG ALLERGIES:   Allergies  Allergen Reactions  . Lisinopril Other (See Comments)    Other reaction(s): Cough    VITALS:  Blood pressure 110/64, pulse (!) 59, temperature 97.9 F (36.6 C), temperature source Axillary, resp. rate 10, height 5' 0.5" (1.537 m), weight 74.9 kg, SpO2 100 %.  PHYSICAL EXAMINATION:   Physical Exam  GENERAL:  73 y.o.-year-old patient lying in the bed with no acute distress.  EYES: Pupils equal, round, reactive to light and accommodation. No scleral icterus. Extraocular muscles intact.  HEENT: Head atraumatic, normocephalic. Oropharynx and nasopharynx clear.  NECK:  Supple, no jugular venous distention. No thyroid enlargement, no tenderness.  LUNGS: Normal breath sounds bilaterally, no wheezing, rales, rhonchi. No use of accessory muscles of respiration.  CARDIOVASCULAR: S1, S2 normal. No murmurs, rubs, or gallops.  ABDOMEN: Soft, nontender, nondistended. Bowel sounds present. No organomegaly or mass.  EXTREMITIES: No cyanosis, clubbing or edema b/l.    NEUROLOGIC: Not following instructions but moves all 4 extremities PSYCHIATRIC: The patient is .  Awake but nonverbal and confused SKIN: No obvious rash, lesion, or ulcer.   LABORATORY PANEL:   CBC Recent Labs  Lab 12/30/18 0227  WBC 15.9*  HGB 10.6*  HCT 35.0*  PLT 237   ------------------------------------------------------------------------------------------------------------------ Chemistries  Recent Labs  Lab 12/30/18 0227 12/30/18 1555  NA 144  --   K 3.0* 4.0  CL 111  --   CO2 20*  --   GLUCOSE 98  --   BUN 36*  --   CREATININE  0.82  --   CALCIUM 8.6*  --   MG 2.6*  --   AST 111*  --   ALT 64*  --   ALKPHOS 227*  --   BILITOT 2.7*  --    ------------------------------------------------------------------------------------------------------------------  Cardiac Enzymes No results for input(s): TROPONINI in the last 168 hours. ------------------------------------------------------------------------------------------------------------------  RADIOLOGY:  No results found.   ASSESSMENT AND PLAN:   73 year old female with a known history of hypertension, hepatocellular carcinoma presented to the emergency room for confusion  Patient is altered.   -Acute hepatic encephalopathy Lactulose.  Ammonia level has trended down Intensivist and gastroenterology evaluation  -Sepsis Unknown etiology Follow-up cultures and lactic acid level IV fluids Broad-spectrum antibiotics Cultures no growth to date Stool PCR negative  -Hepatocellular carcinoma Oncology follow-up Poor prognosis  -Dehydration IV fluids  -Abnormal LFT secondary to hepatocellular carcinoma  -Acute kidney injury IV fluid hydration Resolved  -DVT prophylaxis Subcutaneous Lovenox daily  If no improvement will benefit from transition to hospice home.  All the records are reviewed and case discussed with Care Management/Social Worker Management plans discussed with the patient, family and they are in agreement.  CODE STATUS: FULL CODE  TOTAL TIME TAKING CARE OF THIS PATIENT: 35 minutes.   POSSIBLE D/C IN 3-4 DAYS, DEPENDING ON CLINICAL CONDITION.  Leia Alf Abigail Ayers M.D on 12/30/2018 at 10:15 PM  Between 7am to 6pm - Pager - [phone]  After 6pm go to [email or web address] - password [email or web address] [facility]  Office  [phone]  CC: Primary care physician; Iona Beard,  Sionne A, MD  Note: This dictation was prepared with Dragon dictation along with smaller phrase technology. Any transcriptional errors that result from  this process are unintentional.

## 2018-12-30 NOTE — Progress Notes (Signed)
Patient uncomfortable in bed, rolling from side to side.    Attempted to reposition patient for comfort with no success.  Rectal tube and rectal probe removed in attempt to make patient more comfortable.  After removal of tube patient appeared more comfortable and appears to be resting. Daughter at bedside and call bel within reach for patient and daughter to utilize.

## 2018-12-30 NOTE — Progress Notes (Signed)
[name], MD [address] [place], [place], [place], [address] [address], [place], [place], [place], [address] Phone: [phone]  Fax: [phone]   Subjective: Family at bedside and states patient's mental status has improved somewhat.  Patient is comfortably asleep when I walked in the room today.   Objective: Exam: Vital signs in last 24 hours: Vitals:   12/30/18 0800 12/30/18 0900 12/30/18 1000 12/30/18 1100  BP: 93/72 96/69 118/67 (!) 92/57  Pulse: (!) 56 (!) 56 69 (!) 59  Resp: (!) 9 (!) 8 10 (!) 9  Temp:  (!) 92.7 F (33.7 C) (!) 94.1 F (34.5 C) (!) 94.5 F (34.7 C)  TempSrc:      SpO2:      Weight:      Height:       Weight change:   Intake/Output Summary (Last 24 hours) at 12/30/2018 1433 Last data filed at 12/30/2018 0454 Gross per 24 hour  Intake 1743.01 ml  Output 1095 ml  Net 648.01 ml    General: No acute distress Abd: Soft, NT/ND, No HSM Skin: Warm, no rashes Neck: Supple, Trachea midline   Lab Results: Lab Results  Component Value Date   WBC 15.9 (H) 12/30/2018   HGB 10.6 (L) 12/30/2018   HCT 35.0 (L) 12/30/2018   MCV 77.8 (L) 12/30/2018   PLT 237 12/30/2018   Micro Results: Recent Results (from the past 240 hour(s))  Blood culture (routine x 2)     Status: None (Preliminary result)   Collection Time: 12/12/2018  9:40 AM   Specimen: BLOOD  Result Value Ref Range Status   Specimen Description BLOOD LEFT ANTECUBITAL  Final   Special Requests   Final    BOTTLES DRAWN AEROBIC AND ANAEROBIC Blood Culture adequate volume   Culture   Final    NO GROWTH 3 DAYS Performed at [facility], [address] [place], [place] [address]    Report Status PENDING  Incomplete  Blood culture (routine x 2)     Status: None (Preliminary result)   Collection Time: 12/28/2018  9:51 AM   Specimen: BLOOD LEFT HAND  Result Value Ref Range Status   Specimen Description BLOOD LEFT HAND  Final   Special Requests   Final    BOTTLES  DRAWN AEROBIC AND ANAEROBIC Blood Culture results may not be optimal due to an inadequate volume of blood received in culture bottles   Culture   Final    NO GROWTH 3 DAYS Performed at [facility], [address], [place], [place] [address]    Report Status PENDING  Incomplete  SARS Coronavirus 2 (CEPHEID - Performed in [facility] hospital lab), Hosp Order     Status: None   Collection Time: 11/30/2018 10:24 AM   Specimen: Nasopharyngeal Swab  Result Value Ref Range Status   SARS Coronavirus 2 NEGATIVE NEGATIVE Final    Comment: (NOTE) If result is NEGATIVE SARS-CoV-2 target nucleic acids are NOT DETECTED. The SARS-CoV-2 RNA is generally detectable in upper and lower  respiratory specimens during the acute phase of infection. The lowest  concentration of SARS-CoV-2 viral copies this assay can detect is 250  copies / mL. A negative result does not preclude SARS-CoV-2 infection  and should not be used as the sole basis for treatment or other  patient management decisions.  A negative result may occur with  improper specimen collection / handling, submission of specimen other  than nasopharyngeal swab, presence of viral mutation(s) within  the  areas targeted by this assay, and inadequate number of viral copies  (<250 copies / mL). A negative result must be combined with clinical  observations, patient history, and epidemiological information. If result is POSITIVE SARS-CoV-2 target nucleic acids are DETECTED. The SARS-CoV-2 RNA is generally detectable in upper and lower  respiratory specimens dur ing the acute phase of infection.  Positive  results are indicative of active infection with SARS-CoV-2.  Clinical  correlation with patient history and other diagnostic information is  necessary to determine patient infection status.  Positive results do  not rule out bacterial infection or co-infection with other viruses. If result is PRESUMPTIVE POSTIVE SARS-CoV-2 nucleic acids  MAY BE PRESENT.   A presumptive positive result was obtained on the submitted specimen  and confirmed on repeat testing.  While 2019 novel coronavirus  (SARS-CoV-2) nucleic acids may be present in the submitted sample  additional confirmatory testing may be necessary for epidemiological  and / or clinical management purposes  to differentiate between  SARS-CoV-2 and other Sarbecovirus currently known to infect humans.  If clinically indicated additional testing with an alternate test  methodology [phone]) is advised. The SARS-CoV-2 RNA is generally  detectable in upper and lower respiratory sp ecimens during the acute  phase of infection. The expected result is Negative. Fact Sheet for Patients:  StrictlyIdeas.no Fact Sheet for Healthcare Providers: BankingDealers.co.za This test is not yet approved or cleared by the [place] FDA and has been authorized for detection and/or diagnosis of SARS-CoV-2 by FDA under an Emergency Use Authorization (EUA).  This EUA will remain in effect (meaning this test can be used) for the duration of the COVID-19 declaration under Section 564(b)(1) of the Act, 21 U.S.C. section 360bbb-3(b)(1), unless the authorization is terminated or revoked sooner. Performed at [facility], [address] [place], [place] [address]   MRSA PCR Screening     Status: None   Collection Time: 12/04/2018  3:29 PM   Specimen: Nasal Mucosa; Nasopharyngeal  Result Value Ref Range Status   MRSA by PCR NEGATIVE NEGATIVE Final    Comment:        The GeneXpert MRSA Assay (FDA approved for NASAL specimens only), is one component of a comprehensive MRSA colonization surveillance program. It is not intended to diagnose MRSA infection nor to guide or monitor treatment for MRSA infections. Performed at [facility], [address] [place], [place] [address]   Urine culture     Status: None   Collection  Time: 12/23/2018  3:40 PM   Specimen: Urine, Random  Result Value Ref Range Status   Specimen Description   Final    URINE, RANDOM Performed at [facility], [address] [place], [place] [address]    Special Requests   Final    NONE Performed at [facility], [address], [place], [place] [address]    Culture   Final    NO GROWTH Performed at [facility], [place] [address], [place], [place] [address]    Report Status 12/28/2018 FINAL  Final  Gastrointestinal Panel by PCR , Stool     Status: None   Collection Time: 12/29/18  8:51 PM   Specimen: Stool  Result Value Ref Range Status   Campylobacter species NOT DETECTED NOT DETECTED Final   Plesimonas shigelloides NOT DETECTED NOT DETECTED Final   Salmonella species NOT DETECTED NOT DETECTED Final   Yersinia enterocolitica NOT DETECTED NOT DETECTED Final   Vibrio species  NOT DETECTED NOT DETECTED Final   Vibrio cholerae NOT DETECTED NOT DETECTED Final   Enteroaggregative E coli (EAEC) NOT DETECTED NOT DETECTED Final   Enteropathogenic E coli (EPEC) NOT DETECTED NOT DETECTED Final   Enterotoxigenic E coli (ETEC) NOT DETECTED NOT DETECTED Final   Shiga like toxin producing E coli (STEC) NOT DETECTED NOT DETECTED Final   Shigella/Enteroinvasive E coli (EIEC) NOT DETECTED NOT DETECTED Final   Cryptosporidium NOT DETECTED NOT DETECTED Final   Cyclospora cayetanensis NOT DETECTED NOT DETECTED Final   Entamoeba histolytica NOT DETECTED NOT DETECTED Final   Giardia lamblia NOT DETECTED NOT DETECTED Final   Adenovirus F40/41 NOT DETECTED NOT DETECTED Final   Astrovirus NOT DETECTED NOT DETECTED Final   Norovirus GI/GII NOT DETECTED NOT DETECTED Final   Rotavirus A NOT DETECTED NOT DETECTED Final   Sapovirus (I, II, IV, and V) NOT DETECTED NOT DETECTED Final    Comment: Performed at [facility], [address] [place], [place] [address]  C difficile quick scan w PCR reflex     Status: None    Collection Time: 12/29/18  8:51 PM   Specimen: STOOL  Result Value Ref Range Status   C Diff antigen NEGATIVE NEGATIVE Final   C Diff toxin NEGATIVE NEGATIVE Final   C Diff interpretation No C. difficile detected.  Final    Comment: Performed at [facility], [address] [place], [place] [address]   Studies/Results: No results found. Medications:  Scheduled Meds:  heparin injection (subcutaneous)  5,000 Units Subcutaneous Q8H   rifaximin  550 mg Oral BID   sodium chloride flush  10-40 mL Intracatheter Q12H   Continuous Infusions:  dexmedetomidine (PRECEDEX) IV infusion Stopped (12/30/18 0940)   famotidine (PEPCID) IV Stopped (12/29/18 2138)   lactated ringers with kcl 100 mL/hr at 12/30/18 1127   norepinephrine (LEVOPHED) Adult infusion Stopped (12/30/18 0452)   piperacillin-tazobactam (ZOSYN)  IV 3.375 g (12/30/18 1402)   PRN Meds:.acetaminophen **OR** acetaminophen, morphine injection, morphine injection, ondansetron **OR** ondansetron (ZOFRAN) IV, sodium chloride flush   Assessment: Active Problems:   Sepsis (HCC)   Altered mental status   Dehydration   Hyperammonemia (HCC)   Elevated liver enzymes   Palliative care encounter    Plan: Patient's liver enzymes continue to improve This is consistent with initial elevation likely being from hypotension and dehydration  Continue to avoid hypotension Continue fluid resuscitation as needed  Continue to hold lactulose to prevent diarrhea Continue rifaximin  Infectious stool panel is negative  Baseline elevation liver enzymes due to [facility] which is being monitored by oncology  Hep C RNA and HBV DNA pending, otherwise work-up so far has been unrevealing except for elevated ferritin which is expected in acute phase reaction  Follow-up pending labs  GI service will sign off at this time, please page with any questions or concerns  LOS: 3 days   [name], MD 12/30/2018, 2:33 PM

## 2018-12-30 NOTE — Progress Notes (Signed)
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    Spoke to son Gearldine Shown this am at bedside.  ICU provider Marda Stalker also spoke to daughter Brandon Melnick today at bedside  PAST MEDICAL HISTORY   Past Medical History:  Diagnosis Date  . Hypertension    not currently taking meds     SURGICAL HISTORY   Past Surgical History:  Procedure Laterality Date  . BREAST BIOPSY Left    neg  . CHOLECYSTECTOMY  1992     FAMILY HISTORY   Family History  Problem Relation Age of Onset  . Alzheimer's disease Mother   . Stroke Father   . Breast cancer Daughter 51       [place], genetic test negative     SOCIAL HISTORY   Social History   Tobacco Use  . Smoking status: Never Smoker  . Smokeless tobacco: Never Used  Substance Use Topics  . Alcohol use: No  . Drug use: No     MEDICATIONS   Current Medication:  Current Facility-Administered Medications:  .  acetaminophen (TYLENOL) tablet 650 mg, 650 mg, Oral, Q6H PRN **OR** acetaminophen (TYLENOL) suppository 650 mg, 650 mg, Rectal, Q6H PRN, Pyreddy, Pavan, MD .  dexmedetomidine (PRECEDEX) 400 MCG/100ML (4 mcg/mL) infusion, 0.4-0.8 mcg/kg/hr, Intravenous, Titrated, Blakeney, Dreama Saa, NP, Stopped at 12/30/18 0940 .  famotidine (PEPCID) IVPB 20 mg premix, 20 mg, Intravenous, QHS, Charlett Nose, RPH, Stopped at 12/29/18 2138 .  haloperidol lactate (HALDOL) 5 MG/ML injection, , , ,  .  heparin injection 5,000 Units,  5,000 Units, Subcutaneous, Q8H, Charlett Nose, RPH, 5,000 Units at 12/30/18 0503 .  lactated ringers 1,000 mL with potassium chloride 40 mEq infusion, , Intravenous, Continuous, Charlett Nose, RPH, Last Rate: 100 mL/hr at 12/30/18 1127 .  morphine 2 MG/ML injection 1 mg, 1 mg, Intravenous, Q4H PRN, Awilda Bill, NP .  morphine 2 MG/ML injection 1 mg, 1 mg, Intravenous, Q4H PRN, Awilda Bill, NP, 1 mg at 12/30/18 1610 .  norepinephrine (LEVOPHED) 4mg  in 225mL premix infusion, 0-40 mcg/min, Intravenous, Titrated, [name], NP, Stopped at 12/30/18 [phone] .  ondansetron (ZOFRAN) tablet 4 mg, 4 mg, Oral, Q6H PRN **OR** ondansetron (ZOFRAN) injection 4 mg, 4 mg, Intravenous, Q6H PRN, Pyreddy, Pavan, MD .  piperacillin-tazobactam (ZOSYN) IVPB 3.375 g, 3.375 g, Intravenous, Q8H, Blakeney, Dana G, NP .  rifaximin (XIFAXAN) tablet 550 mg, 550 mg, Oral, BID, Tahiliani, Varnita B, MD .  sodium chloride flush (NS) 0.9 % injection 10-40 mL, 10-40 mL, Intracatheter, Q12H, Sudini, Srikar, MD, 10 mL at 12/30/18 1102 .  sodium chloride flush (NS) 0.9 % injection 10-40 mL, 10-40 mL, Intracatheter, PRN, Sudini, Srikar, MD    ALLERGIES   Lisinopril    REVIEW OF SYSTEMS     Unable to obtain due to obtunded state and lethargy  PHYSICAL EXAMINATION   Vitals:   12/30/18 1000 12/30/18 1100  BP: 118/67 (!) 92/57  Pulse: 69 (!) 59  Resp: 10 (!) 9  Temp: (!) 94.1 F (34.5 C) (!) 94.5 F (34.7 C)  SpO2:      GENERAL: Age-appropriate patient with altered mental status  HEAD: Normocephalic, atraumatic.  EYES: Pupils equal, round, reactive to light.  No scleral icterus.  MOUTH: Moist mucosal membrane. NECK: Supple. No thyromegaly. No nodules. No JVD.  PULMONARY: Mild bibasilar crackles CARDIOVASCULAR: S1 and S2. Regular rate and rhythm. No murmurs, rubs, or gallops.  GASTROINTESTINAL: Soft, nontender, non-distended. No masses. Positive bowel sounds. No hepatosplenomegaly.   MUSCULOSKELETAL: No swelling, clubbing, or edema.  NEUROLOGIC: Mild distress due to acute illness SKIN:intact,warm,dry   LABS AND IMAGING     LAB RESULTS: Recent Labs  Lab 12/28/18 0324 12/29/18 0601 12/30/18 0227  NA 140 141 144  K 3.7 3.3* 3.0*  CL 105 105 111  CO2 21* 21* 20*  BUN 44* 34* 36*  CREATININE 1.59* 0.95 0.82  GLUCOSE 116* 101* 98   Recent Labs  Lab 12/28/18 0324 12/28/18 0956 12/29/18 0601 12/30/18 0227  HGB 11.9* 12.6 12.3 10.6*  HCT 38.4  --  39.6 35.0*  WBC 16.6*  --  14.8* 15.9*  PLT 311  --  259 237     IMAGING RESULTS: No results found.    ASSESSMENT AND PLAN    -Multidisciplinary rounds held today  Altered mental status with lethargy  - likely due to uremic encephalopathy, possible septic encephalopathy with concomitant hepatic encephalopathy.   - lactulose stopped  - fluid resuscitation and empiric IV antibiotics - on immunotherapy with Avastin and Tecentriq which can predispose to infection and extensive cytolysis for bulky tumors including HCC.  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 Tasia Catchings oncology-appreciate input -Status post treatment with Tecentriq and Avastin -Hep C antibody positive RNA in progress    Acute Kidney Injury - KDIGO stage 3  - noted UA  - d/c non-essential nephrotoxic medications  - suspect partially due to transient hypotension  - renal US   - urine electrolytes      Probable sepsis -Blood culture  x2 -will consider LP -on immunotherapy predisposing to infection 1 -Procalcitonin elevated >4, trending -use vasopressors to keep MAP>65 -follow ABG and LA -follow up cultures-no growth x2 days -emperic ABX- cefepime, vancomycin, rifaximin -consider stress dose steroids -noted elevated lactate, trending    Transaminitis -Possibly due to transient hypotension, hepatic congestion in lieu of extensive thormbosis of portal venous circulation, HCC itself, and with component possibly related to recent immunotherapy  -DC nonessential essential hepatotoxic medications and IV resuscitation -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 -GI on case-appreciate input   GI/Nutrition GI PROPHYLAXIS as indicated DIET-->TF's as tolerated Constipation protocol as indicated   ENDO - ICU hypoglycemic\Hyperglycemia protocol -check FSBS per protocol   ELECTROLYTES -follow labs as needed -replace as needed -pharmacy consultation   DVT/GI PRX ordered -SCDs  TRANSFUSIONS AS NEEDED MONITOR FSBS ASSESS the need for LABS as needed   Critical care provider statement:    Critical care time (minutes):  34   Critical care time was exclusive of:  Separately billable procedures and treating other patients   Critical care was necessary to treat or prevent imminent or life-threatening deterioration of the following conditions:   Acute mental  status with lethargy, transaminitis, newly diagnosed hepatocellular carcinoma, extensive venous portal thrombosis, acute kidney injury stage III, probable sepsis, multiple comorbid conditions   Critical care was time spent personally by me on the following activities:  Development of treatment plan with patient or surrogate, discussions with consultants, evaluation of patient's response to treatment, examination of patient, obtaining history from patient or surrogate, ordering and performing treatments and interventions, ordering and review of laboratory studies and  re-evaluation of patient's condition.  I assumed direction of critical care for this patient from another provider in my specialty: no    This document was prepared using Dragon voice recognition software and may include unintentional dictation errors.    Ottie Glazier, M.D.  Division of [place]

## 2018-12-30 NOTE — Progress Notes (Signed)
Pharmacy Electrolyte Monitoring Consult:  Pharmacy consulted to assist in monitoring and replacing electrolytes in this 73 y.o. female admitted on 12/18/2018 with Altered Mental Status   Labs:  Sodium (mmol/L)  Date Value  12/30/2018 144   Potassium (mmol/L)  Date Value  12/30/2018 3.0 (L)   Magnesium (mg/dL)  Date Value  12/30/2018 2.6 (H)   Phosphorus (mg/dL)  Date Value  12/30/2018 3.3   Calcium (mg/dL)  Date Value  12/30/2018 8.6 (L)   Albumin (g/dL)  Date Value  12/30/2018 2.4 (L)    Assessment/Plan: LR transitioned to LR/Potassium 64mEq at 162mL/hr.  Will replace w/ KCI 10 mEq IV x 4 and will recheck electrolytes @ 0900 post K-runs. Goal K > 4.0  Pharmacy will continue to monitor and adjust per consult.    Tobie Lords, PharmD, BCPS Clinical Pharmacist 12/30/2018 4:35 AM

## 2018-12-31 ENCOUNTER — Inpatient Hospital Stay: Payer: Medicare Other

## 2018-12-31 ENCOUNTER — Other Ambulatory Visit: Payer: Medicare Other

## 2018-12-31 DIAGNOSIS — Z888 Allergy status to other drugs, medicaments and biological substances status: Secondary | ICD-10-CM

## 2018-12-31 DIAGNOSIS — Z8619 Personal history of other infectious and parasitic diseases: Secondary | ICD-10-CM

## 2018-12-31 DIAGNOSIS — C220 Liver cell carcinoma: Secondary | ICD-10-CM

## 2018-12-31 DIAGNOSIS — N179 Acute kidney failure, unspecified: Secondary | ICD-10-CM

## 2018-12-31 DIAGNOSIS — E790 Hyperuricemia without signs of inflammatory arthritis and tophaceous disease: Secondary | ICD-10-CM

## 2018-12-31 DIAGNOSIS — D72829 Elevated white blood cell count, unspecified: Secondary | ICD-10-CM

## 2018-12-31 DIAGNOSIS — R740 Nonspecific elevation of levels of transaminase and lactic acid dehydrogenase [LDH]: Secondary | ICD-10-CM

## 2018-12-31 DIAGNOSIS — G9340 Encephalopathy, unspecified: Secondary | ICD-10-CM

## 2018-12-31 LAB — CBC WITH DIFFERENTIAL/PLATELET
Abs Immature Granulocytes: 1.19 10*3/uL — ABNORMAL HIGH (ref 0.00–0.07)
Basophils Absolute: 0.1 10*3/uL (ref 0.0–0.1)
Basophils Relative: 1 %
Eosinophils Absolute: 0.1 10*3/uL (ref 0.0–0.5)
Eosinophils Relative: 1 %
HCT: 33.7 % — ABNORMAL LOW (ref 36.0–46.0)
Hemoglobin: 10.3 g/dL — ABNORMAL LOW (ref 12.0–15.0)
Immature Granulocytes: 9 %
Lymphocytes Relative: 7 %
Lymphs Abs: 1 10*3/uL (ref 0.7–4.0)
MCH: 23.7 pg — ABNORMAL LOW (ref 26.0–34.0)
MCHC: 30.6 g/dL (ref 30.0–36.0)
MCV: 77.6 fL — ABNORMAL LOW (ref 80.0–100.0)
Monocytes Absolute: 1 10*3/uL (ref 0.1–1.0)
Monocytes Relative: 7 %
Neutro Abs: 10.6 10*3/uL — ABNORMAL HIGH (ref 1.7–7.7)
Neutrophils Relative %: 75 %
Platelets: 214 10*3/uL (ref 150–400)
RBC: 4.34 MIL/uL (ref 3.87–5.11)
RDW: 21.4 % — ABNORMAL HIGH (ref 11.5–15.5)
Smear Review: UNDETERMINED
WBC: 14 10*3/uL — ABNORMAL HIGH (ref 4.0–10.5)
nRBC: 4.2 % — ABNORMAL HIGH (ref 0.0–0.2)

## 2018-12-31 LAB — COMPREHENSIVE METABOLIC PANEL
ALT: 51 U/L — ABNORMAL HIGH (ref 0–44)
AST: 80 U/L — ABNORMAL HIGH (ref 15–41)
Albumin: 2.1 g/dL — ABNORMAL LOW (ref 3.5–5.0)
Alkaline Phosphatase: 246 U/L — ABNORMAL HIGH (ref 38–126)
Anion gap: 7 (ref 5–15)
BUN: 31 mg/dL — ABNORMAL HIGH (ref 8–23)
CO2: 22 mmol/L (ref 22–32)
Calcium: 8.6 mg/dL — ABNORMAL LOW (ref 8.9–10.3)
Chloride: 115 mmol/L — ABNORMAL HIGH (ref 98–111)
Creatinine, Ser: 0.92 mg/dL (ref 0.44–1.00)
GFR calc Af Amer: >60 mL/min (ref >60)
GFR calc non Af Amer: >60 mL/min (ref >60)
Glucose, Bld: 171 mg/dL — ABNORMAL HIGH (ref 70–99)
Potassium: 3.8 mmol/L (ref 3.5–5.1)
Sodium: 144 mmol/L (ref 135–145)
Total Bilirubin: 1.7 mg/dL — ABNORMAL HIGH (ref 0.3–1.2)
Total Protein: 5.7 g/dL — ABNORMAL LOW (ref 6.5–8.1)

## 2018-12-31 LAB — GLUCOSE, CAPILLARY
Glucose-Capillary: 155 mg/dL — ABNORMAL HIGH (ref 70–99)
Glucose-Capillary: 142 mg/dL — ABNORMAL HIGH (ref 70–99)
Glucose-Capillary: 156 mg/dL — ABNORMAL HIGH (ref 70–99)
Glucose-Capillary: 163 mg/dL — ABNORMAL HIGH (ref 70–99)

## 2018-12-31 LAB — FERRITIN: Ferritin: 1625 ng/mL — ABNORMAL HIGH (ref 11–307)

## 2018-12-31 LAB — PROCALCITONIN: Procalcitonin: 0.9 ng/mL

## 2018-12-31 LAB — ACTH: C206 ACTH: 30.3 pg/mL (ref 7.2–63.3)

## 2018-12-31 LAB — PHOSPHORUS: Phosphorus: 2.4 mg/dL — ABNORMAL LOW (ref 2.5–4.6)

## 2018-12-31 LAB — MAGNESIUM: Magnesium: 2.3 mg/dL (ref 1.7–2.4)

## 2018-12-31 MED ORDER — FENTANYL CITRATE (PF) 100 MCG/2ML IJ SOLN
12.5000 ug | INTRAMUSCULAR | Status: DC | PRN
  Administered 2018-12-31 – 2019-01-01 (×6): 12.5 ug via INTRAVENOUS
  Filled 2018-12-31 (×6): qty 2

## 2018-12-31 MED ORDER — FENTANYL 25 MCG/HR TD PT72
1.0000 | MEDICATED_PATCH | TRANSDERMAL | Status: DC
  Administered 2018-12-31: 1 via TRANSDERMAL
  Filled 2018-12-31: qty 1

## 2018-12-31 MED ORDER — GADOBUTROL 1 MMOL/ML IV SOLN
7.0000 mL | Freq: Once | INTRAVENOUS | Status: AC | PRN
  Administered 2018-12-31: 7 mL via INTRAVENOUS

## 2018-12-31 MED ORDER — METHYLPREDNISOLONE SODIUM SUCC 125 MG IJ SOLR
50.0000 mg | Freq: Three times a day (TID) | INTRAMUSCULAR | Status: DC
  Administered 2018-12-31 – 2019-01-02 (×6): 50 mg via INTRAVENOUS
  Filled 2018-12-31 (×6): qty 2

## 2018-12-31 NOTE — Progress Notes (Signed)
Pharmacy Electrolyte Monitoring Consult:  Pharmacy consulted to assist in monitoring and replacing electrolytes in this 73 y.o. female admitted on 12/24/2018. Patient with  Extensive protal venous thrombosis s/p immunotherapy. Patient admitted with lethargy and AKI. Patient is off vasopressors.   Labs:  Sodium (mmol/L)  Date Value  12/31/2018 144   Potassium (mmol/L)  Date Value  12/31/2018 3.8   Magnesium (mg/dL)  Date Value  12/31/2018 2.3   Phosphorus (mg/dL)  Date Value  12/31/2018 2.4 (L)   Calcium (mg/dL)  Date Value  12/31/2018 8.6 (L)   Albumin (g/dL)  Date Value  12/31/2018 2.1 (L)    Assessment/Plan: D5/LR; rate reduced to 81mL/hr during AM ICU rounds.   Will obtain electrolytes with am labs.   Will replace for goal potassium ~ 4 and goal magnesium ~ 2.   Pharmacy will continue to monitor and adjust per consult.    Chimaobi Casebolt L 12/31/2018 4:18 PM

## 2018-12-31 NOTE — Procedures (Signed)
ELECTROENCEPHALOGRAM REPORT   Patient: Abigail Ayers       Room #: IC06A-AA EEG No. ID: [id number] Age: 73 y.o.        Sex: female Referring Physician: Lanney Gins Report Date:  12/31/2018        Interpreting Physician: Alexis Goodell  History: Abigail Ayers is an 73 y.o. female with altered mental status  Medications:  Precedex, Fentanyl, Solumedrol, Zosyn   Conditions of Recording:  This is a 21 channel routine scalp EEG performed with bipolar and monopolar montages arranged in accordance to the international 10/20 system of electrode placement. One channel was dedicated to EKG recording.  The patient is in the sedated state.  Description:  The background activity is slow and poorly organized.  It consists of low voltage, polymorphic delta rhythm that is diffusely distributed and continuous.   There are superimposed, intermittent discharges of triphasic morphology that are noted as well.  Hyperventilation and intermittent photic stimulation were not performed.  IMPRESSION: This is an abnormal electroencephalogram due to general background slowing and triphasic waves.  This is seen most commonly in encephalopathic states.  Although most often seen with hepatic encephalopathies, it is not isolated to this clinical scenario.   Alexis Goodell, MD Neurology [phone] 12/31/2018, 6:25 PM

## 2018-12-31 NOTE — Consult Note (Addendum)
NAME: Abigail Ayers  DOB: 1946-06-10  MRN: [id number]  Date/Time: 12/31/2018 2:35 PM  REQUESTING PROVIDER: Lanney Gins Subjective:  REASON FOR CONSULT: Encephalopathy   No history available from patient.  Chart reviewed and spoke with the daughter.  ? Abigail Ayers is a 73 y.o. female with a history of hepatitis C status post Harvoni treatment which he finished in 2016, newly diagnosed hepatocellular carcinoma is admitted with altered mental status.  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  On 11/11/2018 she underwent MRCP with and without contrast which showed a 2.5 cm hypovascular mass.  The portal vein thrombosis also enhanced with contrast consistent with tumor thrombus.  On 11/19/2018 she had an alpha-fetoprotein which was 8616.  She saw Dr. Tasia Catchings the oncologist on Nov 25, 2018 and at that time her WBC was 21.3.  She also had a temperature of 101.  At that time she was referred for a liver biopsy and she underwent one on 12/06/2018 which showed hepatocellular carcinoma moderately differentiated.  She was started on Tecentriq's and Avastin both immune checkpoint inhibitors on 12/23/2018.  She was also started on lenvatinib a tyrosine kinase inhibitor.  Her first dose of the immune checkpoint inhibitors was on 12/23/2018.  On 12/24/2018 she called the cancer center and inform them that she did not sleep well and also had vomiting into 3 episodes and diarrhea and to 5 times.  She was asked to take Zofran and Imodium .  On 6/29 the daughter informed Dr. Tasia Catchings that the patient was continuing to have multiple episodes of diarrhea and was drinking very little and was feeling disoriented.  She was asked to go to the ED.  Patient was admitted to the hospital on 12/18/2018. In the ED her temperature was 97.8 respiratory of 18 heart rate of  70 BP of 105 x 87.  She was admitted to ICU and has been started on Vanco and cefepime. I am asked to see the patient for encephalopathy As per daughter patient was okay before she got the chemotherapy and was at her baseline.  She did not have any fever or chills or cough.  She did not travel.  She did not have any sick contacts.  She lives on her own  Past medical history Hypertension Hepatitis C treated Past Surgical History:  Procedure Laterality Date  . BREAST BIOPSY Left    neg  . CHOLECYSTECTOMY  1992    Social History   Socioeconomic History  . Marital status: Widowed    Spouse name: Not on file  . Number of children: Not on file  . Years of education: Not on file  . Highest education level: Not on file  Occupational History  . Not on file  Social Needs  . Financial resource strain: Not on file  . Food insecurity    Worry: Not on file    Inability: Not on file  . Transportation needs    Medical: Not on file    Non-medical: Not on file  Tobacco Use  . Smoking status: Never Smoker  . Smokeless tobacco: Never Used  Substance and Sexual Activity  . Alcohol use: No  . Drug use: No  . Sexual activity: Not on file  Lifestyle  . Physical activity    Days per week: Not on file    Minutes  per session: Not on file  . Stress: Not on file  Relationships  . Social Herbalist on phone: Not on file    Gets together: Not on file    Attends religious service: Not on file    Active member of club or organization: Not on file    Attends meetings of clubs or organizations: Not on file    Relationship status: Not on file  . Intimate partner violence    Fear of current or ex partner: Not on file    Emotionally abused: Not on file    Physically abused: Not on file    Forced sexual activity: Not on file  Other Topics Concern  . Not on file  Social History Narrative  . Not on file    Family History  Problem Relation Age of Onset  . Alzheimer's disease Mother    . Stroke Father   . Breast cancer Daughter 72       [place], genetic test negative   Allergies  Allergen Reactions  . Lisinopril Other (See Comments)    Other reaction(s): Cough     ? Current Facility-Administered Medications  Medication Dose Route Frequency Provider Last Rate Last Dose  . dexmedetomidine (PRECEDEX) 400 MCG/100ML (4 mcg/mL) infusion  0.4-1.2 mcg/kg/hr Intravenous Titrated [name] D, NP 11.24 mL/hr at 12/31/18 1210 0.6 mcg/kg/hr at 12/31/18 1210  . dextrose 5 % in lactated ringers infusion   Intravenous Continuous Awilda Bill, NP 50 mL/hr at 12/31/18 1210    . fentaNYL (DURAGESIC) 25 MCG/HR 1 patch  1 patch Transdermal Q72H Awilda Bill, NP   1 patch at 12/31/18 1208  . fentaNYL (SUBLIMAZE) injection 12.5 mcg  12.5 mcg Intravenous Q2H PRN [name] D, NP   12.5 mcg at 12/31/18 0523  . norepinephrine (LEVOPHED) 4mg  in 27mL premix infusion  0-40 mcg/min Intravenous Titrated [name], NP   Stopped at 12/30/18 [phone]  . ondansetron (ZOFRAN) tablet 4 mg  4 mg Oral Q6H PRN Pyreddy, Reatha Harps, MD       Or  . ondansetron (ZOFRAN) injection 4 mg  4 mg Intravenous Q6H PRN Pyreddy, Pavan, MD      . pantoprazole (PROTONIX) injection 40 mg  40 mg Intravenous Q12H Awilda Bill, NP   40 mg at 12/31/18 1100  . piperacillin-tazobactam (ZOSYN) IVPB 3.375 g  3.375 g Intravenous Q8H Awilda Bill, NP 12.5 mL/hr at 12/31/18 0543    . simethicone (MYLICON) chewable tablet 80 mg  80 mg Oral Q6H PRN Awilda Bill, NP      . sodium chloride flush (NS) 0.9 % injection 10-40 mL  10-40 mL Intracatheter Q12H Hillary Bow, MD   20 mL at 12/30/18 2120  . sodium chloride flush (NS) 0.9 % injection 10-40 mL  10-40 mL Intracatheter PRN Sudini, Alveta Heimlich, MD         Abtx:  Anti-infectives (From admission, onward)   Start     Dose/Rate Route Frequency Ordered Stop   12/30/18 1400  piperacillin-tazobactam (ZOSYN) IVPB 3.375 g     3.375 g 12.5 mL/hr over 240 Minutes  Intravenous Every 8 hours 12/30/18 0946     12/29/18 1200  vancomycin (VANCOCIN) 1,250 mg in sodium chloride 0.9 % 250 mL IVPB  Status:  Discontinued     1,250 mg 166.7 mL/hr over 90 Minutes Intravenous Every 36 hours 12/29/18 1119 12/30/18 0946   12/29/18 1000  ceFEPIme (MAXIPIME) 2 g in sodium chloride 0.9 %  100 mL IVPB  Status:  Discontinued     2 g 200 mL/hr over 30 Minutes Intravenous Every 12 hours 12/29/18 0856 12/30/18 0946   12/28/18 1715  vancomycin (VANCOCIN) IVPB 1000 mg/200 mL premix     1,000 mg 200 mL/hr over 60 Minutes Intravenous  Once 12/28/18 1707 12/28/18 1855   12/25/2018 2200  ceFEPIme (MAXIPIME) 1 g in sodium chloride 0.9 % 100 mL IVPB  Status:  Discontinued     1 g 200 mL/hr over 30 Minutes Intravenous  Once 12/06/2018 1719 12/13/2018 1828   12/02/2018 2200  ceFEPIme (MAXIPIME) 1 g in sodium chloride 0.9 % 100 mL IVPB  Status:  Discontinued     1 g 200 mL/hr over 30 Minutes Intravenous Every 12 hours 12/21/2018 1828 12/29/18 0856   12/26/2018 1800  piperacillin-tazobactam (ZOSYN) IVPB 3.375 g  Status:  Discontinued     3.375 g 12.5 mL/hr over 240 Minutes Intravenous Every 12 hours 12/15/2018 1237 12/25/2018 1719   12/13/2018 1730  vancomycin (VANCOCIN) IVPB 1000 mg/200 mL premix  Status:  Discontinued     1,000 mg 200 mL/hr over 60 Minutes Intravenous  Once 12/08/2018 1719 12/03/2018 1736   12/24/2018 1500  rifaximin (XIFAXAN) tablet 550 mg  Status:  Discontinued     550 mg Oral 2 times daily 12/12/2018 1439 12/30/18 1536   12/21/2018 1402  vancomycin variable dose per unstable renal function (pharmacist dosing)  Status:  Discontinued      Does not apply See admin instructions 12/07/2018 1402 12/29/18 1124   12/13/2018 1345  vancomycin (VANCOCIN) 500 mg in sodium chloride 0.9 % 100 mL IVPB     500 mg 100 mL/hr over 60 Minutes Intravenous  Once 12/28/2018 1326 12/19/2018 1643   12/25/2018 1045  ceFEPIme (MAXIPIME) 2 g in sodium chloride 0.9 % 100 mL IVPB     2 g 200 mL/hr over 30 Minutes Intravenous   Once 12/03/2018 1036 12/17/2018 1127   12/01/2018 1045  metroNIDAZOLE (FLAGYL) IVPB 500 mg     500 mg 100 mL/hr over 60 Minutes Intravenous  Once 12/06/2018 1036 12/02/2018 1238   12/20/2018 1045  vancomycin (VANCOCIN) IVPB 1000 mg/200 mL premix     1,000 mg 200 mL/hr over 60 Minutes Intravenous  Once 12/16/2018 1036 12/05/2018 1305      REVIEW OF SYSTEMS:  NA Objective:  VITALS:  BP 113/61   Pulse 78   Temp 98 F (36.7 C) (Axillary)   Resp 16   Ht 5' 0.5" (1.537 m)   Wt 74.9 kg   SpO2 97%   BMI 31.72 kg/m  PHYSICAL EXAM:  General: Obtunded, on calling her name she moans and mumbles Head: Normocephalic, without obvious abnormality, atraumatic. Eyes: Conjunctivae clear, anicteric sclerae. Pupils are equal ENT unable to examine Neck: Supple, symmetrical, no adenopathy, thyroid: non tender no carotid bruit and no JVD.  Lungs: Bilateral air entry Heart: S1-S2 Abdomen: Soft, non-tender,not distended. Bowel sounds normal. No masses Extremities: atraumatic, no cyanosis. No edema. No clubbing Skin: No rashes or lesions. Or bruising Lymph: Cervical, supraclavicular normal. Neurologic: Obtunded Trying to move her limbs pertinent Labs Lab Results CBC      Component Value Date/Time   WBC 14.0 (H) 12/31/2018 0507   RBC 4.34 12/31/2018 0507   HGB 10.3 (L) 12/31/2018 0507   HGB 12.6 12/28/2018 0956   HCT 33.7 (L) 12/31/2018 0507   PLT 214 12/31/2018 0507   MCV 77.6 (L) 12/31/2018 0507   MCH 23.7 (L) 12/31/2018 0507  MCHC 30.6 12/31/2018 0507   RDW 21.4 (H) 12/31/2018 0507   LYMPHSABS 1.0 12/31/2018 0507   MONOABS 1.0 12/31/2018 0507   EOSABS 0.1 12/31/2018 0507   BASOSABS 0.1 12/31/2018 0507    CMP Latest Ref Rng & Units 12/31/2018 12/30/2018 12/30/2018  Glucose 70 - 99 mg/dL 171(H) - 98  BUN 8 - 23 mg/dL 31(H) - 36(H)  Creatinine 0.44 - 1.00 mg/dL 0.92 - 0.82  Sodium 135 - 145 mmol/L 144 - 144  Potassium 3.5 - 5.1 mmol/L 3.8 4.0 3.0(L)  Chloride 98 - 111 mmol/L 115(H) - 111  CO2 22  - 32 mmol/L 22 - 20(L)  Calcium 8.9 - 10.3 mg/dL 8.6(L) - 8.6(L)  Total Protein 6.5 - 8.1 g/dL 5.7(L) - 6.0(L)  Total Bilirubin 0.3 - 1.2 mg/dL 1.7(H) - 2.7(H)  Alkaline Phos 38 - 126 U/L 246(H) - 227(H)  AST 15 - 41 U/L 80(H) - 111(H)  ALT 0 - 44 U/L 51(H) - 64(H)       URIC acid 104, 0.7 NH3  121-44-14  Hepatitis panel neg HSV/CMV neg  Microbiology: Recent Results (from the past 240 hour(s))  Blood culture (routine x 2)     Status: None (Preliminary result)   Collection Time: 12/17/2018  9:40 AM   Specimen: BLOOD  Result Value Ref Range Status   Specimen Description BLOOD LEFT ANTECUBITAL  Final   Special Requests   Final    BOTTLES DRAWN AEROBIC AND ANAEROBIC Blood Culture adequate volume   Culture   Final    NO GROWTH 4 DAYS Performed at [facility], [address], [place], [place] [address]    Report Status PENDING  Incomplete  Blood culture (routine x 2)     Status: None (Preliminary result)   Collection Time: 12/16/2018  9:51 AM   Specimen: BLOOD LEFT HAND  Result Value Ref Range Status   Specimen Description BLOOD LEFT HAND  Final   Special Requests   Final    BOTTLES DRAWN AEROBIC AND ANAEROBIC Blood Culture results may not be optimal due to an inadequate volume of blood received in culture bottles   Culture   Final    NO GROWTH 4 DAYS Performed at [facility], [address] [place], [place] [address]    Report Status PENDING  Incomplete  SARS Coronavirus 2 (CEPHEID - Performed in [place] hospital lab), Hosp Order     Status: None   Collection Time: 12/22/2018 10:24 AM   Specimen: Nasopharyngeal Swab  Result Value Ref Range Status   SARS Coronavirus 2 NEGATIVE NEGATIVE Final    Comment: (NOTE) If result is NEGATIVE SARS-CoV-2 target nucleic acids are NOT DETECTED. The SARS-CoV-2 RNA is generally detectable in upper and lower  respiratory specimens during the acute phase of infection. The lowest  concentration of SARS-CoV-2 viral  copies this assay can detect is 250  copies / mL. A negative result does not preclude SARS-CoV-2 infection  and should not be used as the sole basis for treatment or other  patient management decisions.  A negative result may occur with  improper specimen collection / handling, submission of specimen other  than nasopharyngeal swab, presence of viral mutation(s) within the  areas targeted by this assay, and inadequate number of viral copies  (<250 copies / mL). A negative result must be combined with clinical  observations, patient history, and epidemiological information. If result is POSITIVE SARS-CoV-2 target nucleic acids are DETECTED. The SARS-CoV-2 RNA is generally detectable in  upper and lower  respiratory specimens dur ing the acute phase of infection.  Positive  results are indicative of active infection with SARS-CoV-2.  Clinical  correlation with patient history and other diagnostic information is  necessary to determine patient infection status.  Positive results do  not rule out bacterial infection or co-infection with other viruses. If result is PRESUMPTIVE POSTIVE SARS-CoV-2 nucleic acids MAY BE PRESENT.   A presumptive positive result was obtained on the submitted specimen  and confirmed on repeat testing.  While 2019 novel coronavirus  (SARS-CoV-2) nucleic acids may be present in the submitted sample  additional confirmatory testing may be necessary for epidemiological  and / or clinical management purposes  to differentiate between  SARS-CoV-2 and other Sarbecovirus currently known to infect humans.  If clinically indicated additional testing with an alternate test  methodology [phone]) is advised. The SARS-CoV-2 RNA is generally  detectable in upper and lower respiratory sp ecimens during the acute  phase of infection. The expected result is Negative. Fact Sheet for Patients:  StrictlyIdeas.no Fact Sheet for Healthcare Providers:  BankingDealers.co.za This test is not yet approved or cleared by the [place] FDA and has been authorized for detection and/or diagnosis of SARS-CoV-2 by FDA under an Emergency Use Authorization (EUA).  This EUA will remain in effect (meaning this test can be used) for the duration of the COVID-19 declaration under Section 564(b)(1) of the Act, 21 U.S.C. section 360bbb-3(b)(1), unless the authorization is terminated or revoked sooner. Performed at [facility], [address] [place], [place] [address]   MRSA PCR Screening     Status: None   Collection Time: 11/29/2018  3:29 PM   Specimen: Nasal Mucosa; Nasopharyngeal  Result Value Ref Range Status   MRSA by PCR NEGATIVE NEGATIVE Final    Comment:        The GeneXpert MRSA Assay (FDA approved for NASAL specimens only), is one component of a comprehensive MRSA colonization surveillance program. It is not intended to diagnose MRSA infection nor to guide or monitor treatment for MRSA infections. Performed at [facility], [address] [place], [place] [address]   Urine culture     Status: None   Collection Time: 12/03/2018  3:40 PM   Specimen: Urine, Random  Result Value Ref Range Status   Specimen Description   Final    URINE, RANDOM Performed at [facility], [address] [place], [place] [address]    Special Requests   Final    NONE Performed at [facility], [address], [place], [place] [address]    Culture   Final    NO GROWTH Performed at [facility], [place] [address], [place], [place] [address]    Report Status 12/28/2018 FINAL  Final  Gastrointestinal Panel by PCR , Stool     Status: None   Collection Time: 12/29/18  8:51 PM   Specimen: Stool  Result Value Ref Range Status   Campylobacter species NOT DETECTED NOT DETECTED Final   Plesimonas shigelloides NOT DETECTED NOT DETECTED Final   Salmonella species NOT DETECTED NOT DETECTED  Final   Yersinia enterocolitica NOT DETECTED NOT DETECTED Final   Vibrio species NOT DETECTED NOT DETECTED Final   Vibrio cholerae NOT DETECTED NOT DETECTED Final   Enteroaggregative E coli (EAEC) NOT DETECTED NOT DETECTED Final   Enteropathogenic E coli (EPEC) NOT DETECTED NOT DETECTED Final   Enterotoxigenic E coli (ETEC) NOT DETECTED NOT DETECTED Final   Shiga  like toxin producing E coli (STEC) NOT DETECTED NOT DETECTED Final   Shigella/Enteroinvasive E coli (EIEC) NOT DETECTED NOT DETECTED Final   Cryptosporidium NOT DETECTED NOT DETECTED Final   Cyclospora cayetanensis NOT DETECTED NOT DETECTED Final   Entamoeba histolytica NOT DETECTED NOT DETECTED Final   Giardia lamblia NOT DETECTED NOT DETECTED Final   Adenovirus F40/41 NOT DETECTED NOT DETECTED Final   Astrovirus NOT DETECTED NOT DETECTED Final   Norovirus GI/GII NOT DETECTED NOT DETECTED Final   Rotavirus A NOT DETECTED NOT DETECTED Final   Sapovirus (I, II, IV, and V) NOT DETECTED NOT DETECTED Final    Comment: Performed at [facility], [address] [place], [place] [address]  C difficile quick scan w PCR reflex     Status: None   Collection Time: 12/29/18  8:51 PM   Specimen: STOOL  Result Value Ref Range Status   C Diff antigen NEGATIVE NEGATIVE Final   C Diff toxin NEGATIVE NEGATIVE Final   C Diff interpretation No C. difficile detected.  Final    Comment: Performed at [facility], [address] [place], [place] [address]    IMAGING RESULTS:     I have personally reviewed the films ? Impression/Recommendation ?73 year old female with history of hepatocellular carcinoma got her first dose of anticancer medication with atezolizumab which is immune checkpoint inhibitor and bevacizumab (a vascular endothelial growth factor inhibitor ) on 12/23/2018.  Since 26 June she has had diarrhea and then has become confused.  Encephalopathy likely could be metabolic as she had acute kidney injury and  also increased ammonia and hepatic injury.  Other differential includes encephalitis from immune checkpoint inhibitor or an infection. MRI done does not show any acute changes neither does it show any temporal lobe enhancement.  So HSV is less likely. EEG done does not show any PLEDs so HSV unlikely. She has been on antibiotics initially on Vanco and cefepime with no improvement so bacterial meningitis is less likely as well. With diarrhea which could be colitis, hepatitis and kidney injury, she could have immune encephalitis this could all be secondary to immune toxicity from the checkpoint inhibitors  AKI, transaminitis, leukocytosis and hyperlactetemia favor sepsis-like picture.  But she has had negative blood culture, urine and chest x-ray.  She was on vancomycin and cefepime and currently on Zosyn.   Hyperuricemia on admission.  Was thought to be due to tumor lysis syndrome.  It has resolved she did get restful cares ? 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    Discussed with intensivist, oncologist and neurologist. If steroids are needed there is no contraindication from an infection perspective to use them . LP would be helpful __________________________________________________ Discussed with daughter Note:  This document was prepared using Dragon voice recognition software and may include unintentional dictation errors.

## 2018-12-31 NOTE — Progress Notes (Signed)
eeg complete.

## 2018-12-31 NOTE — Progress Notes (Signed)
[facility] Hematology/Oncology Progress Note  Date of admission: 12/21/2018  Hospital day:  12/31/2018  Chief Complaint: Abigail Ayers is a 73 y.o. female with hepatocellular carcinoma who was admitted with altered mental status.  Subjective:  Patient unresponsive in the ICU.  She is hypothermic.  Social History: The patient is alone today.  Allergies:  Allergies  Allergen Reactions  . Lisinopril Other (See Comments)    Other reaction(s): Cough    Scheduled Medications: . fentaNYL  1 patch Transdermal Q72H  . pantoprazole (PROTONIX) IV  40 mg Intravenous Q12H  . sodium chloride flush  10-40 mL Intracatheter Q12H    Review of Systems: Unable to be obtained.  Physical Exam: Blood pressure 113/61, pulse 78, temperature 98 F (36.7 C), temperature source Axillary, resp. rate 16, height 5' 0.5" (1.537 m), weight 165 lb 2 oz (74.9 kg), SpO2 97 %.  GENERAL:  Chronically ill appearing woman lying in the ICU under a warming blanket in no acute distress. MENTAL STATUS:  Unresponsive to sternal rub. HEAD:  Fraying hair.  Normocephalic, atraumatic, face thin and symmetric, no Cushingoid features. EYES:  Brown eyes.  Pupils equal round and reactive to light and accomodation.  No conjunctivitis or scleral icterus. ENT:  [place] in place.  Oropharynx clear without lesion.  Tongue normal. Mucous membranes dry.  RESPIRATORY:  Clear to auscultation anteriorly without rales, wheezes or rhonchi. CARDIOVASCULAR:  Regular rate and rhythm without murmur, rub or gallop. ABDOMEN:  Soft, non-tender, with active bowel sounds, and no appreciable hepatosplenomegaly.  No masses. SKIN:  No rashes, ulcers or lesions. EXTREMITIES:  Thin extremities.  No edema, no skin discoloration or tenderness.  No palpable cords. LYMPH NODES: No palpable cervical, supraclavicular, axillary or inguinal adenopathy  NEUROLOGICAL:  Unresponsive to sternal rub and stimuli to great toe bilaterally.  Moves all  4 extremities spontaneously.  Intermittently moans.   Results for orders placed or performed during the hospital encounter of 12/17/2018 (from the past 48 hour(s))  Gastrointestinal Panel by PCR , Stool     Status: None   Collection Time: 12/29/18  8:51 PM   Specimen: Stool  Result Value Ref Range   Campylobacter species NOT DETECTED NOT DETECTED   Plesimonas shigelloides NOT DETECTED NOT DETECTED   Salmonella species NOT DETECTED NOT DETECTED   Yersinia enterocolitica NOT DETECTED NOT DETECTED   Vibrio species NOT DETECTED NOT DETECTED   Vibrio cholerae NOT DETECTED NOT DETECTED   Enteroaggregative E coli (EAEC) NOT DETECTED NOT DETECTED   Enteropathogenic E coli (EPEC) NOT DETECTED NOT DETECTED   Enterotoxigenic E coli (ETEC) NOT DETECTED NOT DETECTED   Shiga like toxin producing E coli (STEC) NOT DETECTED NOT DETECTED   Shigella/Enteroinvasive E coli (EIEC) NOT DETECTED NOT DETECTED   Cryptosporidium NOT DETECTED NOT DETECTED   Cyclospora cayetanensis NOT DETECTED NOT DETECTED   Entamoeba histolytica NOT DETECTED NOT DETECTED   Giardia lamblia NOT DETECTED NOT DETECTED   Adenovirus F40/41 NOT DETECTED NOT DETECTED   Astrovirus NOT DETECTED NOT DETECTED   Norovirus GI/GII NOT DETECTED NOT DETECTED   Rotavirus A NOT DETECTED NOT DETECTED   Sapovirus (I, II, IV, and V) NOT DETECTED NOT DETECTED    Comment: Performed at [facility], [address] [place], [place] [address]  C difficile quick scan w PCR reflex     Status: None   Collection Time: 12/29/18  8:51 PM   Specimen: STOOL  Result Value Ref Range   C Diff antigen NEGATIVE  NEGATIVE   C Diff toxin NEGATIVE NEGATIVE   C Diff interpretation No C. difficile detected.     Comment: Performed at [facility], [address] [place], [place] [address]  Magnesium     Status: Abnormal   Collection Time: 12/30/18  2:27 AM  Result Value Ref Range   Magnesium 2.6 (H) 1.7 - 2.4 mg/dL    Comment: Performed at  [facility], [address] [place], [place] [address]  Phosphorus     Status: None   Collection Time: 12/30/18  2:27 AM  Result Value Ref Range   Phosphorus 3.3 2.5 - 4.6 mg/dL    Comment: Performed at [facility], [address] [place], [place] [address]  Cortisol     Status: None   Collection Time: 12/30/18  2:27 AM  Result Value Ref Range   Cortisol, Plasma 32.9 ug/dL    Comment: (NOTE) AM    6.7 - 22.6 ug/dL PM   <10.0       ug/dL Performed at [facility] [address], [place], [place] [address]   TSH     Status: None   Collection Time: 12/30/18  2:27 AM  Result Value Ref Range   TSH 1.609 0.350 - 4.500 uIU/mL    Comment: Performed by a 3rd Generation assay with a functional sensitivity of <=0.01 uIU/mL. Performed at [facility], [address] [place], [place] [address]   T4, free     Status: Abnormal   Collection Time: 12/30/18  2:27 AM  Result Value Ref Range   Free T4 1.16 (H) 0.61 - 1.12 ng/dL    Comment: (NOTE) Biotin ingestion may interfere with free T4 tests. If the results are inconsistent with the TSH level, previous test results, or the clinical presentation, then consider biotin interference. If needed, order repeat testing after stopping biotin. Performed at [facility], [address] [place],  [address]   Comprehensive metabolic panel     Status: Abnormal   Collection Time: 12/30/18  2:27 AM  Result Value Ref Range   Sodium 144 135 - 145 mmol/L   Potassium 3.0 (L) 3.5 - 5.1 mmol/L   Chloride 111 98 - 111 mmol/L   CO2 20 (L) 22 - 32 mmol/L   Glucose, Bld 98 70 - 99 mg/dL   BUN 36 (H) 8 - 23 mg/dL   Creatinine, Ser 0.82 0.44 - 1.00 mg/dL   Calcium 8.6 (L) 8.9 - 10.3 mg/dL   Total Protein 6.0 (L) 6.5 - 8.1 g/dL   Albumin 2.4 (L) 3.5 - 5.0 g/dL   AST 111 (H) 15 - 41 U/L   ALT 64 (H) 0 - 44 U/L   Alkaline Phosphatase 227 (H) 38 - 126 U/L   Total Bilirubin 2.7 (H) 0.3 - 1.2 mg/dL   GFR  calc non Af Amer >60 >60 mL/min   GFR calc Af Amer >60 >60 mL/min   Anion gap 13 5 - 15    Comment: Performed at [facility], [address] [place],  [address]  CBC     Status: Abnormal   Collection Time: 12/30/18  2:27 AM  Result Value Ref Range   WBC 15.9 (H) 4.0 - 10.5 K/uL   RBC 4.50 3.87 - 5.11 MIL/uL   Hemoglobin 10.6 (L) 12.0 - 15.0 g/dL   HCT 35.0 (L) 36.0 - 46.0 %   MCV 77.8 (L) 80.0 - 100.0 fL   MCH 23.6 (L)  26.0 - 34.0 pg   MCHC 30.3 30.0 - 36.0 g/dL   RDW 21.2 (H) 11.5 - 15.5 %   Platelets 237 150 - 400 K/uL   nRBC 2.3 (H) 0.0 - 0.2 %    Comment: Performed at [facility], [address] [place], [place] [address]  Ammonia     Status: None   Collection Time: 12/30/18  9:54 AM  Result Value Ref Range   Ammonia 14 9 - 35 umol/L    Comment: Performed at [facility], [address] [place], [place] [address]  Procalcitonin - Baseline     Status: None   Collection Time: 12/30/18  9:54 AM  Result Value Ref Range   Procalcitonin 1.37 ng/mL    Comment:        Interpretation: PCT > 0.5 ng/mL and <= 2 ng/mL: Systemic infection (sepsis) is possible, but other conditions are known to elevate PCT as well. (NOTE)       Sepsis PCT Algorithm           Lower Respiratory Tract                                      Infection PCT Algorithm    ----------------------------     ----------------------------         PCT < 0.25 ng/mL                PCT < 0.10 ng/mL         Strongly encourage             Strongly discourage   discontinuation of antibiotics    initiation of antibiotics    ----------------------------     -----------------------------       PCT 0.25 - 0.50 ng/mL            PCT 0.10 - 0.25 ng/mL               OR       >80% decrease in PCT            Discourage initiation of                                            antibiotics      Encourage discontinuation           of antibiotics    ----------------------------      -----------------------------         PCT >= 0.50 ng/mL              PCT 0.26 - 0.50 ng/mL                AND       <80% decrease in PCT             Encourage initiation of                                             antibiotics       Encourage continuation           of antibiotics    ----------------------------     -----------------------------  PCT >= 0.50 ng/mL                  PCT > 0.50 ng/mL               AND         increase in PCT                  Strongly encourage                                      initiation of antibiotics    Strongly encourage escalation           of antibiotics                                     -----------------------------                                           PCT <= 0.25 ng/mL                                                 OR                                        > 80% decrease in PCT                                     Discontinue / Do not initiate                                             antibiotics Performed at [facility], [address] [place], [place] [address]   Glucose, capillary     Status: Abnormal   Collection Time: 12/30/18  3:33 PM  Result Value Ref Range   Glucose-Capillary 67 (L) 70 - 99 mg/dL  Potassium     Status: None   Collection Time: 12/30/18  3:55 PM  Result Value Ref Range   Potassium 4.0 3.5 - 5.1 mmol/L    Comment: Performed at [facility], [address] [place], [place] [address]  Glucose, capillary     Status: None   Collection Time: 12/30/18  5:11 PM  Result Value Ref Range   Glucose-Capillary 98 70 - 99 mg/dL  Glucose, capillary     Status: None   Collection Time: 12/30/18  7:15 PM  Result Value Ref Range   Glucose-Capillary 90 70 - 99 mg/dL   Comment 1 Notify RN   Glucose, capillary     Status: Abnormal   Collection Time: 12/30/18 11:07 PM  Result Value Ref Range   Glucose-Capillary 122 (H) 70 - 99 mg/dL   Comment 1 Notify RN   Glucose, capillary     Status:  Abnormal   Collection Time: 12/31/18  3:05  AM  Result Value Ref Range   Glucose-Capillary 155 (H) 70 - 99 mg/dL   Comment 1 Notify RN   Procalcitonin     Status: None   Collection Time: 12/31/18  5:07 AM  Result Value Ref Range   Procalcitonin 0.90 ng/mL    Comment:        Interpretation: PCT > 0.5 ng/mL and <= 2 ng/mL: Systemic infection (sepsis) is possible, but other conditions are known to elevate PCT as well. (NOTE)       Sepsis PCT Algorithm           Lower Respiratory Tract                                      Infection PCT Algorithm    ----------------------------     ----------------------------         PCT < 0.25 ng/mL                PCT < 0.10 ng/mL         Strongly encourage             Strongly discourage   discontinuation of antibiotics    initiation of antibiotics    ----------------------------     -----------------------------       PCT 0.25 - 0.50 ng/mL            PCT 0.10 - 0.25 ng/mL               OR       >80% decrease in PCT            Discourage initiation of                                            antibiotics      Encourage discontinuation           of antibiotics    ----------------------------     -----------------------------         PCT >= 0.50 ng/mL              PCT 0.26 - 0.50 ng/mL                AND       <80% decrease in PCT             Encourage initiation of                                             antibiotics       Encourage continuation           of antibiotics    ----------------------------     -----------------------------        PCT >= 0.50 ng/mL                  PCT > 0.50 ng/mL               AND         increase in PCT                  Strongly encourage  initiation of antibiotics    Strongly encourage escalation           of antibiotics                                     -----------------------------                                           PCT <= 0.25 ng/mL                                                  OR                                        > 80% decrease in PCT                                     Discontinue / Do not initiate                                             antibiotics Performed at [facility], [address] [place], [place] [address]   CBC with Differential/Platelet     Status: Abnormal   Collection Time: 12/31/18  5:07 AM  Result Value Ref Range   WBC 14.0 (H) 4.0 - 10.5 K/uL   RBC 4.34 3.87 - 5.11 MIL/uL   Hemoglobin 10.3 (L) 12.0 - 15.0 g/dL   HCT 33.7 (L) 36.0 - 46.0 %   MCV 77.6 (L) 80.0 - 100.0 fL   MCH 23.7 (L) 26.0 - 34.0 pg   MCHC 30.6 30.0 - 36.0 g/dL   RDW 21.4 (H) 11.5 - 15.5 %   Platelets 214 150 - 400 K/uL   nRBC 4.2 (H) 0.0 - 0.2 %   Neutrophils Relative % 75 %   Neutro Abs 10.6 (H) 1.7 - 7.7 K/uL   Lymphocytes Relative 7 %   Lymphs Abs 1.0 0.7 - 4.0 K/uL   Monocytes Relative 7 %   Monocytes Absolute 1.0 0.1 - 1.0 K/uL   Eosinophils Relative 1 %   Eosinophils Absolute 0.1 0.0 - 0.5 K/uL   Basophils Relative 1 %   Basophils Absolute 0.1 0.0 - 0.1 K/uL   WBC Morphology MILD LEFT SHIFT (1-5% METAS, OCC MYELO, OCC BANDS)    Smear Review PLATELET CLUMPS NOTED ON SMEAR, UNABLE TO ESTIMATE    Immature Granulocytes 9 %   Abs Immature Granulocytes 1.19 (H) 0.00 - 0.07 K/uL   Polychromasia PRESENT    Target Cells PRESENT     Comment: Performed at [facility], [address] [place], [place] [address]  Comprehensive metabolic panel     Status: Abnormal   Collection Time: 12/31/18  5:07 AM  Result Value Ref Range   Sodium 144 135 - 145 mmol/L   Potassium 3.8 3.5 - 5.1 mmol/L   Chloride 115 (H) 98 -  111 mmol/L   CO2 22 22 - 32 mmol/L   Glucose, Bld 171 (H) 70 - 99 mg/dL   BUN 31 (H) 8 - 23 mg/dL   Creatinine, Ser 0.92 0.44 - 1.00 mg/dL   Calcium 8.6 (L) 8.9 - 10.3 mg/dL   Total Protein 5.7 (L) 6.5 - 8.1 g/dL   Albumin 2.1 (L) 3.5 - 5.0 g/dL   AST 80 (H) 15 - 41 U/L   ALT 51 (H) 0 - 44 U/L   Alkaline  Phosphatase 246 (H) 38 - 126 U/L   Total Bilirubin 1.7 (H) 0.3 - 1.2 mg/dL   GFR calc non Af Amer >60 >60 mL/min   GFR calc Af Amer >60 >60 mL/min   Anion gap 7 5 - 15    Comment: Performed at [facility], [address], [place], [place] [address]  Magnesium     Status: None   Collection Time: 12/31/18  5:07 AM  Result Value Ref Range   Magnesium 2.3 1.7 - 2.4 mg/dL    Comment: Performed at [facility], [address] [place],  [address]  Phosphorus     Status: Abnormal   Collection Time: 12/31/18  5:07 AM  Result Value Ref Range   Phosphorus 2.4 (L) 2.5 - 4.6 mg/dL    Comment: Performed at [facility] [address] [place], [place] [address]  Glucose, capillary     Status: Abnormal   Collection Time: 12/31/18  7:36 AM  Result Value Ref Range   Glucose-Capillary 142 (H) 70 - 99 mg/dL   No results found.  Assessment:  Abigail Ayers is a 73 y.o. 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  She was admitted with altered mental status.  Symptomatically, she is unresponsive.  She is hypothermic.  Exam reveals no apparent source of infection.  Plan: 1. Hepatocellular carcinoma  Patient has a history of hepatitis C s/p Harvoni. Patient s/p cycle #1 Tecentriq and Avastin on 12/23/2018.  No evidence of hypophysitis, hypothyroidism or adrenal insufficiency.   ACTH, TSH, free T4, and cortisol normal.  No active diarrhea.  2. Diarrhea  Patient initially noted to have multiple episodes of diarrhea.  Per nursing, last stool was semisolid.  Stool negative for C diff and GI panel.   Holding lactulose to prevent diarrhea. 3. Elevated liver function tests, improving  12/28/2018:  AST 419, ALT 103, bilirubin 2.8, alkaline phosphatase 222.  Ammonia 44.   12/31/2018:  AST 80, ALT 51, bilirubin 1.7, alkaline phosphatase 246.  Ammonia 14 on 12/30/2018.  Hepatitis B DNA and hepatitis C RNA testing  pending. 4. Presumed sepsis from unknown source  She was empirically treated with vancomycin and Cefepime.  Cultures negative to date.  Concern for hypothermia (?autonomic dysregulation).  Agree with ID consultation. 5. Acute renal insufficiency  12/19/2018:  Creatinine 3.28.  12/31/2018:  Creatinine 0.92 6. Altered mental status  Etiology unclear.  Head CT without contrast on 12/26/2018 revealed mild periventricular small vessel disease.  Consider head MRI.  Agree with neurology consultation. 7.   Code status  Altha Harm, NP involved in patient care.  Full code.   Lequita Asal, MD  12/31/2018, 3:31 PM

## 2018-12-31 NOTE — Progress Notes (Signed)
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    Discussed with ID specialist for consult today. Plan for MRI today and possible LP to eval for infectious encephalitis  Will consider neuro consult as well for additional input.   PAST MEDICAL HISTORY   Past Medical History:  Diagnosis Date  . Hypertension    not currently taking meds     SURGICAL HISTORY   Past Surgical History:  Procedure Laterality Date  . BREAST BIOPSY Left    neg  . CHOLECYSTECTOMY  1992     FAMILY HISTORY   Family History  Problem Relation Age of Onset  . Alzheimer's disease Mother   . Stroke Father   . Breast cancer Daughter 80       [place], genetic test negative     SOCIAL HISTORY   Social History   Tobacco Use  . Smoking status: Never Smoker  . Smokeless tobacco: Never Used  Substance Use Topics  . Alcohol use: No  . Drug use: No     MEDICATIONS   Current Medication:  Current Facility-Administered Medications:  .  dexmedetomidine (PRECEDEX) 400 MCG/100ML (4 mcg/mL) infusion, 0.4-1.2 mcg/kg/hr, Intravenous, Titrated, [name] D, NP, Last Rate: 22.5 mL/hr at 12/31/18 0543, 1.2 mcg/kg/hr at 12/31/18 0543 .  dextrose 5 % in lactated ringers infusion, , Intravenous, Continuous, Charlett Nose, RPH, Last Rate: 100 mL/hr at 12/31/18 0543 .  fentaNYL (DURAGESIC) 12 MCG/HR 1 patch, 1 patch, Transdermal, Q72H, Awilda Bill, NP, 1 patch at 12/30/18 1615 .  fentaNYL  (SUBLIMAZE) injection 12.5 mcg, 12.5 mcg, Intravenous, Q2H PRN, [name] D, NP, 12.5 mcg at 12/31/18 0523 .  norepinephrine (LEVOPHED) 4mg  in 238mL premix infusion, 0-40 mcg/min, Intravenous, Titrated, [name], NP, Stopped at 12/30/18 [phone] .  ondansetron (ZOFRAN) tablet 4 mg, 4 mg, Oral, Q6H PRN **OR** ondansetron (ZOFRAN) injection 4 mg, 4 mg, Intravenous, Q6H PRN, Pyreddy, Pavan, MD .  pantoprazole (PROTONIX) injection 40 mg, 40 mg, Intravenous, Q12H, Blakeney, Dana G, NP, 40 mg at 12/30/18 2111 .  piperacillin-tazobactam (ZOSYN) IVPB 3.375 g, 3.375 g, Intravenous, Q8H, Blakeney, Dana G, NP, Last Rate: 12.5 mL/hr at 12/31/18 0543 .  simethicone (MYLICON) chewable tablet 80 mg, 80 mg, Oral, Q6H PRN, Awilda Bill, NP .  sodium chloride flush (NS) 0.9 % injection 10-40 mL, 10-40 mL, Intracatheter, Q12H, Sudini, Srikar, MD, 20 mL at 12/30/18 2120 .  sodium chloride flush (NS) 0.9 % injection 10-40 mL, 10-40 mL, Intracatheter, PRN, Sudini, Srikar, MD    ALLERGIES   Lisinopril    REVIEW OF SYSTEMS     Unable to obtain due to obtunded state and lethargy  PHYSICAL EXAMINATION   Vitals:   12/31/18 0630 12/31/18 0645  BP: 110/68   Pulse: (!) 56   Resp: 10   Temp:  (!) 97.1 F (36.2 C)  SpO2: 100%     GENERAL: Age-appropriate patient with altered mental status HEAD: Normocephalic, atraumatic.  EYES: Pupils equal, round, reactive to light.  No scleral icterus.  MOUTH: Moist mucosal membrane. NECK: Supple. No thyromegaly. No nodules. No JVD.  PULMONARY: Mild bibasilar crackles CARDIOVASCULAR: S1 and S2. Regular rate  and rhythm. No murmurs, rubs, or gallops.  GASTROINTESTINAL: Soft, nontender, non-distended. No masses. Positive bowel sounds. No hepatosplenomegaly.  MUSCULOSKELETAL: No swelling, clubbing, or edema.  NEUROLOGIC: Mild distress due to acute illness SKIN:intact,warm,dry   LABS AND IMAGING     LAB RESULTS: Recent Labs  Lab 12/29/18 0601  12/30/18 0227 12/30/18 1555 12/31/18 0507  NA 141 144  --  144  K 3.3* 3.0* 4.0 3.8  CL 105 111  --  115*  CO2 21* 20*  --  22  BUN 34* 36*  --  31*  CREATININE 0.95 0.82  --  0.92  GLUCOSE 101* 98  --  171*   Recent Labs  Lab 12/29/18 0601 12/30/18 0227 12/31/18 0507  HGB 12.3 10.6* 10.3*  HCT 39.6 35.0* 33.7*  WBC 14.8* 15.9* 14.0*  PLT 259 237 214     IMAGING RESULTS: No results found.    ASSESSMENT AND PLAN    -Multidisciplinary rounds held today  Altered mental status with lethargy  - poss septic in etiology vs toxic metabolic,  Uremia and ammonia has been evaluated and treated with some improvement evident per children but not to baseline.  - will obtain MRI brain as well as ID and neuro eval  - lactulose stopped  - fluid resuscitation and empiric IV antibiotics - on immunotherapy with Avastin and Tecentriq which can predispose to infection and extensive cytolysis for bulky tumors including HCC.  -Procalcitonin elevated indicative of possible infection   Possible TLS   -evidenced by significant hyperuricemia and AKI stage III with lethargy post initiation of chemo but electrolytes not meeting criteria for TLS  - will obtain G6PD level and initiate Rasburicase if necessary   Hepatocellular carcinoma with portal vein throbosis - s/p Liver Bx - newly diagnosed -Followed by Dr. Tasia Catchings oncology-appreciate input -Status post treatment with Tecentriq and Avastin -Hep C antibody positive RNA in progress    Acute Kidney Injury - KDIGO stage 3  - noted UA  - d/c non-essential nephrotoxic medications  - suspect partially due to transient hypotension  - renal US   - urine electrolytes      Probable sepsis -Blood culture x2 -will consider LP -on immunotherapy predisposing to infection 1 -Procalcitonin elevated >4, trending -use vasopressors to keep MAP>65 -follow ABG and LA -follow up cultures-no growth x2 days -emperic ABX- cefepime, vancomycin,  rifaximin -consider stress dose steroids -noted elevated lactate, trending    Transaminitis -Possibly due to transient hypotension, hepatic congestion in lieu of extensive thormbosis of portal venous circulation, HCC itself, and with component possibly related to recent immunotherapy  -DC nonessential essential hepatotoxic medications and IV resuscitation -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, 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 with assumed cure -GI on case-appreciate input   GI/Nutrition GI PROPHYLAXIS as indicated DIET-->TF's as tolerated Constipation protocol as indicated   ENDO - ICU hypoglycemic\Hyperglycemia protocol -check FSBS per protocol   ELECTROLYTES -follow labs as needed -replace as needed -pharmacy consultation   DVT/GI PRX ordered -SCDs  TRANSFUSIONS AS NEEDED MONITOR FSBS ASSESS the need for LABS as needed   Critical care provider statement:    Critical care time (minutes):  34   Critical care time was exclusive of:  Separately billable procedures and treating other patients   Critical care was necessary to treat or prevent imminent or life-threatening deterioration of the following conditions:   Acute mental status with lethargy, transaminitis, newly diagnosed hepatocellular carcinoma, extensive venous portal thrombosis, acute kidney injury stage III, probable sepsis, multiple  comorbid conditions   Critical care was time spent personally by me on the following activities:  Development of treatment plan with patient or surrogate, discussions with consultants, evaluation of patient's response to treatment, examination of patient, obtaining history from patient or surrogate, ordering and performing treatments and interventions, ordering and review of laboratory studies and re-evaluation of patient's condition.  I assumed direction of critical care for this patient from another provider in my specialty: no    This document was prepared using Dragon voice recognition  software and may include unintentional dictation errors.    Ottie Glazier, M.D.  Division of [facility]

## 2018-12-31 NOTE — Progress Notes (Signed)
[facility]  Telephone:([phone] [phone]   Name: Abigail Ayers Date: 12/31/2018 MRN: [id number]  DOB: 03-Feb-1946  Patient Care Team: Sharyne Peach, MD as PCP - General (Family Medicine) Bary Castilla, Forest Gleason, MD (General Surgery) Sharyne Peach, MD (Family Medicine) Clent Jacks, RN as Registered Nurse    REASON FOR CONSULTATION: Palliative Care consult requested for [phone] y.o.femalewith multiple medical problems including locally advanced HCC currently on treatment withatezolizumab plus bevacizumab (last treated on 12/23/2018).PMH also notable for history of HCV status post Harvoni treatment, and portal vein thrombus.  Patient was admitted to the hospital on 12/09/2018 with altered mental status and was found to have presumed sepsis from unclear source.  She was also found to have multiorgan dysfunction with renal antibiotic involvement. Patient was referred to palliative care to help address symptoms and goals.  CODE STATUS: Full code  PAST MEDICAL HISTORY: Past Medical History:  Diagnosis Date   Hypertension    not currently taking meds    PAST SURGICAL HISTORY:  Past Surgical History:  Procedure Laterality Date   BREAST BIOPSY Left    neg   CHOLECYSTECTOMY  1992    HEMATOLOGY/ONCOLOGY HISTORY:  Oncology History  Hepatocellular carcinoma ([place])  12/16/2018 Initial Diagnosis   Hepatocellular carcinoma ([place])   12/23/2018 -  Chemotherapy   The patient had bevacizumab (AVASTIN) 1,200 mg in sodium chloride 0.9 % 100 mL chemo infusion, 15 mg/kg = 1,200 mg, Intravenous,  Once, 1 of 6 cycles Administration: 1,200 mg (12/23/2018) atezolizumab (TECENTRIQ) 1,200 mg in sodium chloride 0.9 % 250 mL chemo infusion, 1,200 mg, Intravenous, Once, 1 of 6 cycles Administration: 1,200 mg (12/23/2018)  for chemotherapy treatment.    12/25/2018 Cancer Staging   Staging form: Liver, AJCC 8th Edition - Clinical:  Stage IIIB (cT4, cN0, cM0) - Signed by Earlie Server, MD on 12/25/2018     ALLERGIES:  is allergic to lisinopril.  MEDICATIONS:  Current Facility-Administered Medications  Medication Dose Route Frequency Provider Last Rate Last Dose   dexmedetomidine (PRECEDEX) 400 MCG/100ML (4 mcg/mL) infusion  0.4-1.2 mcg/kg/hr Intravenous Titrated [name] D, NP 22.5 mL/hr at 12/31/18 0543 1.2 mcg/kg/hr at 12/31/18 0543   dextrose 5 % in lactated ringers infusion   Intravenous Continuous Awilda Bill, NP 100 mL/hr at 12/31/18 0543     fentaNYL (DURAGESIC) 25 MCG/HR 1 patch  1 patch Transdermal Q72H Awilda Bill, NP       fentaNYL (SUBLIMAZE) injection 12.5 mcg  12.5 mcg Intravenous Q2H PRN [name] D, NP   12.5 mcg at 12/31/18 0523   norepinephrine (LEVOPHED) 4mg  in 276mL premix infusion  0-40 mcg/min Intravenous Titrated [name], NP   Stopped at 12/30/18 0452   ondansetron (ZOFRAN) tablet 4 mg  4 mg Oral Q6H PRN Saundra Shelling, MD       Or   ondansetron (ZOFRAN) injection 4 mg  4 mg Intravenous Q6H PRN Pyreddy, Reatha Harps, MD       pantoprazole (PROTONIX) injection 40 mg  40 mg Intravenous Q12H Awilda Bill, NP   40 mg at 12/30/18 2111   piperacillin-tazobactam (ZOSYN) IVPB 3.375 g  3.375 g Intravenous Q8H Awilda Bill, NP 12.5 mL/hr at 12/31/18 0543     simethicone (MYLICON) chewable tablet 80 mg  80 mg Oral Q6H PRN Awilda Bill, NP       sodium chloride flush (NS) 0.9 % injection 10-40 mL  10-40 mL Intracatheter Q12H Sudini,  Srikar, MD   20 mL at 12/30/18 2120   sodium chloride flush (NS) 0.9 % injection 10-40 mL  10-40 mL Intracatheter PRN Hillary Bow, MD        VITAL SIGNS: BP 110/68    Pulse (!) 56    Temp (!) 97.1 F (36.2 C) (Axillary)    Resp 10    Ht 5' 0.5" (1.537 m)    Wt 165 lb 2 oz (74.9 kg)    SpO2 100%    BMI 31.72 kg/m  Filed Weights   12/26/2018 0814 12/03/2018 1400 12/28/18 0400  Weight: 170 lb 4.8 oz (77.2 kg) 162 lb 11.2 oz (73.8 kg)  165 lb 2 oz (74.9 kg)    Estimated body mass index is 31.72 kg/m as calculated from the following:   Height as of this encounter: 5' 0.5" (1.537 m).   Weight as of this encounter: 165 lb 2 oz (74.9 kg).  LABS: CBC:    Component Value Date/Time   WBC 14.0 (H) 12/31/2018 0507   HGB 10.3 (L) 12/31/2018 0507   HGB 12.6 12/28/2018 0956   HCT 33.7 (L) 12/31/2018 0507   PLT 214 12/31/2018 0507   MCV 77.6 (L) 12/31/2018 0507   NEUTROABS 10.6 (H) 12/31/2018 0507   LYMPHSABS 1.0 12/31/2018 0507   MONOABS 1.0 12/31/2018 0507   EOSABS 0.1 12/31/2018 0507   BASOSABS 0.1 12/31/2018 0507   Comprehensive Metabolic Panel:    Component Value Date/Time   NA 144 12/31/2018 0507   K 3.8 12/31/2018 0507   CL 115 (H) 12/31/2018 0507   CO2 22 12/31/2018 0507   BUN 31 (H) 12/31/2018 0507   CREATININE 0.92 12/31/2018 0507   GLUCOSE 171 (H) 12/31/2018 0507   CALCIUM 8.6 (L) 12/31/2018 0507   AST 80 (H) 12/31/2018 0507   ALT 51 (H) 12/31/2018 0507   ALKPHOS 246 (H) 12/31/2018 0507   BILITOT 1.7 (H) 12/31/2018 0507   PROT 5.7 (L) 12/31/2018 0507   ALBUMIN 2.1 (L) 12/31/2018 0507    RADIOGRAPHIC STUDIES: Ct Head Wo Contrast  Result Date: 12/10/2018 CLINICAL DATA:  Liver carcinoma with altered mental status/confusion EXAM: CT HEAD WITHOUT CONTRAST TECHNIQUE: Contiguous axial images were obtained from the base of the skull through the vertex without intravenous contrast. COMPARISON:  None. FINDINGS: Brain: Ventricles and sulci are within normal limits for age. There is no demonstrable intracranial mass, hemorrhage, extra-axial fluid collection, or midline shift. There is slight small vessel disease in the centra semiovale bilaterally. No acute infarct evident. Vascular: No hyperdense vessel. There are foci of calcification in each carotid siphon region. Skull: The bony calvarium appears intact. No neoplastic focus involving bony structures. Sinuses/Orbits: Visualized paranasal sinuses are clear.  Visualized orbits appear symmetric bilaterally. Other: Mastoid air cells are clear. IMPRESSION: Mild periventricular small vessel disease. No acute infarct. No mass or hemorrhage. There are foci of arterial vascular calcification. Electronically Signed   By: Lowella Grip III M.D.   On: 12/23/2018 09:46   Ct Chest Wo Contrast  Result Date: 12/15/2018 CLINICAL DATA:  Hepatocellular carcinoma staging. Chest pain. Portal vein thrombosis. EXAM: CT CHEST WITHOUT CONTRAST TECHNIQUE: Multidetector CT imaging of the chest was performed following the standard protocol without IV contrast. COMPARISON:  Abdominal CT 11/05/2018.  No prior chest imaging. FINDINGS: Cardiovascular: Aortic atherosclerosis. Tortuous thoracic aorta. Borderline cardiomegaly. Multivessel coronary artery atherosclerosis. Mediastinum/Nodes: No supraclavicular adenopathy. No mediastinal or definite hilar adenopathy, given limitations of unenhanced CT. Lungs/Pleura: Trace right pleural fluid. No suspicious pulmonary nodule  or mass. Areas of bilateral major fissure thickening versus small subpleural lymph nodes. A right apical subpleural 3 mm nodule on image 29/3 is also most likely a subpleural lymph node. Upper Abdomen: Cirrhosis with heterogeneity throughout the liver, better evaluated on contrast-enhanced studies. New small volume ascites since 11/05/2018. Musculoskeletal: Thoracic spondylosis. IMPRESSION: 1.  No acute process or evidence of metastatic disease in the chest. 2. Coronary artery atherosclerosis. Aortic Atherosclerosis (ICD10-I70.0). 3. Trace right pleural fluid. 4. New small volume ascites. Electronically Signed   By: Abigail Miyamoto M.D.   On: 12/15/2018 08:51   US Biopsy (liver)  Result Date: 12/06/2018 INDICATION: 72 year old female with a history liver mass EXAM: ULTRASOUND-GUIDED BIOPSY LIVER MASS MEDICATIONS: None. ANESTHESIA/SEDATION: Moderate (conscious) sedation was employed during this procedure. A total of Versed 1.5 mg  and Fentanyl 75 mcg was administered intravenously. Moderate Sedation Time: 25 minutes. The patient's level of consciousness and vital signs were monitored continuously by radiology nursing throughout the procedure under my direct supervision. FLUOROSCOPY TIME:  ULTRASOUND COMPLICATIONS: NONE PROCEDURE: Informed written consent was obtained from the patient after a thorough discussion of the procedural risks, benefits and alternatives. All questions were addressed. Maximal Sterile Barrier Technique was utilized including caps, mask, sterile gowns, sterile gloves, sterile drape, hand hygiene and skin antiseptic. A timeout was performed prior to the initiation of the procedure Ultrasound survey of the right liver lobe performed with images stored and sent to PACs. The right lower thorax/right upper abdomen was prepped with chlorhexidine in a sterile fashion, and a sterile drape was applied covering the operative field. A sterile gown and sterile gloves were used for the procedure. Local anesthesia was provided with 1% Lidocaine. Once the patient is prepped and draped sterilely and the skin and subcutaneous tissues were generously infiltrated with 1% lidocaine, a small stab incision was made with an 11 blade scalpel. A 17 gauge introducer needle was advanced under ultrasound guidance in an intercostal location into the right liver lobe, targeting ill-defined parenchyma of the liver tissue of the right liver. The stylet was removed, and multiple separate 18 gauge core biopsy were retrieved. Samples were placed into formalin for transportation to the lab. Gel-Foam slurry was then infused with a small amount of saline for assistance with hemostasis. The needle was removed, and a final ultrasound image was performed. The patient tolerated the procedure well and remained hemodynamically stable throughout. No complications were encountered and no significant blood loss was encounter. IMPRESSION: Status post ultrasound-guided  biopsy of ill-defined mass of the right liver. Signed, Dulcy Fanny. Dellia Nims, RPVI [facility] Electronically Signed   By: Corrie Mckusick D.O.   On: 12/06/2018 10:17   Dg Chest Port 1 View  Result Date: 12/15/2018 CLINICAL DATA:  73 year old female with history of confusion and disorientation after initial doses of chemotherapy for liver cancer. EXAM: PORTABLE CHEST 1 VIEW COMPARISON:  No priors. FINDINGS: Lung volumes are low. Linear opacities in lung bases bilaterally favored to reflect areas of subsegmental atelectasis or scarring. No consolidative airspace disease. No pleural effusions. No evidence of pulmonary edema. Heart size is normal. The patient is rotated to the left on today's exam, resulting in distortion of the mediastinal contours and reduced diagnostic sensitivity and specificity for mediastinal pathology. Aortic atherosclerosis. IMPRESSION: 1. Low lung volumes with bibasilar areas of atelectasis and/or scarring (left greater than right). 2. Aortic atherosclerosis. Electronically Signed   By: Vinnie Langton M.D.   On: 12/10/2018 09:13   US  Abdomen Limited Ruq  Result Date: 12/11/2018 CLINICAL DATA:  Hepatocellular carcinoma with tumor thrombus shown on prior imaging. Elevated liver enzymes. EXAM: ULTRASOUND ABDOMEN LIMITED RIGHT UPPER QUADRANT COMPARISON:  11/11/2018 MRI FINDINGS: Gallbladder: Surgically absent Common bile duct: Diameter: 5 mm Liver: Heterogeneous right hepatic lobe likely due to underlying tumors and underlying tumor thrombus. There is notable thrombus in the vicinity of the right portal vein and portal vein confluence, with some reconstitution of portal venous flow or flow around the thrombus in the porta hepatis. There is some evidence of retrograde flow in parts of the portal vein before entering the liver for example on image 27 and image 28. There is also some heterogeneity in the left hepatic lobe, for example a  3.7 by 2.8 by 3.4 cm potential lesion posteriorly, and tumor involvement in the left hepatic lobe is not excluded. IMPRESSION: 1. Tumor primarily in the right hepatic lobe and possibly in the left hepatic lobe, with ill definition. 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 There is some retrograde flow in the segment of the portal vein leading towards this thrombus as shown on image 27. 2. No significant biliary dilatation is identified. Electronically Signed   By: Van Clines M.D.   On: 12/16/2018 17:04    PERFORMANCE STATUS (ECOG) : 4 - Bedbound  Review of Systems Unless otherwise noted, a complete review of systems is negative.  Physical Exam General: Ill-appearing Cardiovascular: regular rate and rhythm Pulmonary: Unlabored Abdomen: soft, nontender, + bowel sounds Extremities: no edema Skin: no rashes Neurological: sedated.  IMPRESSION: Follow-up visit made.  Patient remains in ICU.  Hypothermic overnight and now on heating blanket. Patient's transaminases, ammonia, and renal function all significantly improved. However, patient remains encephalopathic. Etiology less clear. Would consider reimaging with CT or MRI of brain. Consider ID consult with consideration of LP.   Case discussed with Dr. Mike Gip.   I tried to call patient's son but did not reach him.   PLAN: -Continue current scope of treatment -Consider repeat CT of brain or MRI with sedation  -We will continue to follow   Patient expressed understanding and was in agreement with this plan. She also understands that She can call the clinic at any time with any questions, concerns, or complaints.     Time Total: 15 minutes  Visit consisted of counseling and education dealing with the complex and emotionally intense issues of symptom management and palliative care in the setting of serious and potentially life-threatening illness.Greater than  50%  of this time was spent counseling and coordinating care related to the above assessment and plan.  Signed by: Altha Harm, PhD, NP-C [phone] (Work Cell)

## 2018-12-31 NOTE — Progress Notes (Signed)
[facility] NAME: Abigail Ayers    MR#:  [id number]  DATE OF BIRTH:  1945-12-28  SUBJECTIVE:  CHIEF COMPLAINT:   Chief Complaint  Patient presents with  . Altered Mental Status   On Bair hugger due to hypothermia  REVIEW OF SYSTEMS:    Review of Systems  Unable to perform ROS: Mental status change    DRUG ALLERGIES:   Allergies  Allergen Reactions  . Lisinopril Other (See Comments)    Other reaction(s): Cough    VITALS:  Blood pressure 113/61, pulse 78, temperature 98 F (36.7 C), temperature source Axillary, resp. rate 16, height 5' 0.5" (1.537 m), weight 74.9 kg, SpO2 97 %.  PHYSICAL EXAMINATION:   Physical Exam  GENERAL:  73 y.o.-year-old patient lying in the bed with no acute distress.  EYES: Pupils equal, round, reactive to light and accommodation. No scleral icterus. Extraocular muscles intact.  HEENT: Head atraumatic, normocephalic. Oropharynx and nasopharynx clear.  NECK:  Supple, no jugular venous distention. No thyroid enlargement, no tenderness.  LUNGS: Normal breath sounds bilaterally, no wheezing, rales, rhonchi. No use of accessory muscles of respiration.  CARDIOVASCULAR: S1, S2 normal. No murmurs, rubs, or gallops.  ABDOMEN: Soft, nontender, nondistended. Bowel sounds present. No organomegaly or mass.  EXTREMITIES: No cyanosis, clubbing or edema b/l.    NEUROLOGIC: Not following instructions  PSYCHIATRIC: The patient is drowzy SKIN: No obvious rash, lesion, or ulcer.   LABORATORY PANEL:   CBC Recent Labs  Lab 12/31/18 0507  WBC 14.0*  HGB 10.3*  HCT 33.7*  PLT 214   ------------------------------------------------------------------------------------------------------------------ Chemistries  Recent Labs  Lab 12/31/18 0507  NA 144  K 3.8  CL 115*  CO2 22  GLUCOSE 171*  BUN 31*  CREATININE 0.92  CALCIUM 8.6*  MG 2.3  AST 80*  ALT 51*  ALKPHOS 246*  BILITOT 1.7*    ------------------------------------------------------------------------------------------------------------------  Cardiac Enzymes No results for input(s): TROPONINI in the last 168 hours. ------------------------------------------------------------------------------------------------------------------  RADIOLOGY:  No results found.   ASSESSMENT AND PLAN:   73 year old female with a known history of hypertension, hepatocellular carcinoma presented to the emergency room for confusion  Patient is altered.   -Acute hepatic encephalopathy Lactulose.  Ammonia level has trended down Intensivist and gastroenterology evaluation Neurology consulted  -Sepsis Unknown etiology Follow-up cultures and lactic acid level IV fluids Broad-spectrum antibiotics Cultures no growth to date LP being considered by ICU team  -Hepatocellular carcinoma Oncology follow-up Poor prognosis  -Dehydration IV fluids  -Abnormal LFT secondary to hepatocellular carcinoma  -Acute kidney injury IV fluid hydration Resolved  -DVT prophylaxis Subcutaneous Lovenox daily  If no improvement will benefit from transition to hospice home.  All the records are reviewed and case discussed with Care Management/Social Worker Management plans discussed with the patient, family and they are in agreement.  CODE STATUS: FULL CODE  TOTAL TIME TAKING CARE OF THIS PATIENT: 35 minutes.   POSSIBLE D/C IN 3-4 DAYS, DEPENDING ON CLINICAL CONDITION.  [name] M.D on 12/31/2018 at 2:26 PM  Between 7am to 6pm - Pager - [phone]  After 6pm go to [email or web address] - password [email or web address] [facility]  Office  [phone]  CC: Primary care physician; Sharyne Peach, MD  Note: This dictation was prepared with Dragon dictation along with smaller phrase technology. Any transcriptional errors that result from this process are unintentional.

## 2018-12-31 NOTE — Consult Note (Signed)
Reason for Consult:AMS Referring Physician: Lanney Gins  CC: AMS  HPI: Abigail Ayers is an 73 y.o. female admitted 6/29 with altered mental status.  Patient unable to provide history.  All history obtained from the chart.  Patient with newly diagnosed [place] who underwent first cycle of immunotherapy and targeted therapy with Tecentriq and Avastin on 12/23/2018.  Had multiple episodes of diarrhea, disorientated and unresponsive.  Patient brought to the ED.  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  Questionable sepsis.  Patient admitted to ICU.  Has been on Cefepime, Zosyn and Vancomycin.  No improvement in mental status noted.    Head CT unremarkable.    Past Medical History:  Diagnosis Date  . Hypertension    not currently taking meds    Past Surgical History:  Procedure Laterality Date  . BREAST BIOPSY Left    neg  . CHOLECYSTECTOMY  1992    Family History  Problem Relation Age of Onset  . Alzheimer's disease Mother   . Stroke Father   . Breast cancer Daughter 62       [place], genetic test negative    Social History:  reports that she has never smoked. She has never used smokeless tobacco. She reports that she does not drink alcohol or use drugs.  Allergies  Allergen Reactions  . Lisinopril Other (See Comments)    Other reaction(s): Cough    Medications:  I have reviewed the patient's current medications. Prior to Admission:  Medications Prior to Admission  Medication Sig Dispense Refill Last Dose  . Cholecalciferol (VITAMIN D) 1000 UNITS capsule Take 1,000 Units by mouth.   Past Week at Unknown time  . dronabinol (MARINOL) 5 MG capsule Take 1 capsule (5 mg total) by mouth 2 (two) times daily before a meal. 30 capsule 0 12/26/2018 at Unknown time  . loperamide (IMODIUM) 2 MG capsule Take 1 capsule (2 mg total) by mouth See admin instructions. With onset of loose stool, take 4mg  followed by 2mg  every  2 hours until 12 hours have passed without loose bowel movement. Maximum: 16 mg/day 120 capsule 1 Past Week at Unknown time  . Multiple Vitamin (MULTIVITAMIN) tablet Take 1 tablet by mouth.    Past Week at Unknown time  . ondansetron (ZOFRAN) 8 MG tablet Take 1 tablet (8 mg total) by mouth 2 (two) times daily as needed (Nausea or vomiting). 30 tablet 1 prn at prn  . potassium chloride (K-DUR) 10 MEQ tablet Take 1 tablet (10 mEq total) by mouth daily. 30 tablet 0 12/26/2018 at Unknown time  . prochlorperazine (COMPAZINE) 10 MG tablet Take 1 tablet (10 mg total) by mouth every 6 (six) hours as needed (Nausea or vomiting). 30 tablet 1 prn at prn  . traMADol (ULTRAM) 50 MG tablet Take 1 tablet (50 mg total) by mouth every 8 (eight) hours as needed. 30 tablet 0 prn at prn  . vitamin B-12 (CYANOCOBALAMIN) 100 MCG tablet Take 50 mcg by mouth daily.   Past Week at Unknown time   Scheduled: . fentaNYL  1 patch Transdermal Q72H  . pantoprazole (PROTONIX) IV  40 mg Intravenous Q12H  . sodium chloride flush  10-40 mL Intracatheter Q12H    ROS: Unable to provide due to mental status  Physical Examination: Blood pressure 113/61, pulse 78, temperature 98 F (36.7 C), temperature source Axillary, resp. rate 16, height 5' 0.5" (1.537 m), weight 74.9 kg, SpO2 97 %.  HEENT-  Normocephalic,  no lesions, without obvious abnormality.  Normal external eye and conjunctiva.  Normal TM's bilaterally.  Normal auditory canals and external ears. Normal external nose, mucus membranes and septum.  Normal pharynx. Cardiovascular- S1, S2 normal, pulses palpable throughout   Lungs- chest clear, no wheezing, rales, normal symmetric air entry Abdomen- soft, non-tender; bowel sounds normal; no masses,  no organomegaly Extremities- no edema Lymph-no adenopathy palpable Musculoskeletal-no joint tenderness, deformity or swelling Skin-warm and dry, no hyperpigmentation, vitiligo, or suspicious lesions  Neurological Examination    Mental Status: Patient does not respond to verbal stimuli.  Opens eyes with deep sternal rub.  Does not follow commands.  Groans at times.  Cranial Nerves: II: patient does not respond confrontation bilaterally, pupils right 3 mm, left 3 mm,and reactive bilaterally III,IV,VI: Oculocephalic response present bilaterally.  V,VII: corneal reflex present bilaterally  VIII: patient does not respond to verbal stimuli IX,X: gag reflex reduced, XI: trapezius strength unable to test bilaterally XII: tongue strength unable to test Motor: Spontaneous movement noted throughout.  No purposeful movements noted. Sensory: Does not respond to noxious stimuli in any extremity. Deep Tendon Reflexes:  2+ in the upper extremities and absent in the lower extremities Plantars: Mute bilaterally Cerebellar: Unable to perform  Laboratory Studies:   Basic Metabolic Panel: Recent Labs  Lab 12/28/2018 0940 12/19/2018 1813 12/28/18 0324 12/29/18 0601 12/30/18 0227 12/30/18 1555 12/31/18 0507  NA 135  --  140 141 144  --  144  K 3.6  --  3.7 3.3* 3.0* 4.0 3.8  CL 95*  --  105 105 111  --  115*  CO2 17*  --  21* 21* 20*  --  22  GLUCOSE 122*  --  116* 101* 98  --  171*  BUN 50*  --  44* 34* 36*  --  31*  CREATININE 3.28*  --  1.59* 0.95 0.82  --  0.92  CALCIUM 9.7  --  8.6* 8.8* 8.6*  --  8.6*  MG 2.7*  --  2.5*  --  2.6*  --  2.3  PHOS  --  2.7  --   --  3.3  --  2.4*    Liver Function Tests: Recent Labs  Lab 12/21/2018 0940 12/28/18 0324 12/29/18 0601 12/30/18 0227 12/31/18 0507  AST 422* 419* 185* 111* 80*  ALT 98* 103* 81* 64* 51*  ALKPHOS 243* 222* 251* 227* 246*  BILITOT 2.8* 2.8* 3.4* 2.7* 1.7*  PROT 8.3* 6.9 6.2* 6.0* 5.7*  ALBUMIN 3.4* 2.9* 2.5* 2.4* 2.1*   No results for input(s): LIPASE, AMYLASE in the last 168 hours. Recent Labs  Lab 12/28/2018 0940 12/28/18 0324 12/30/18 0954  AMMONIA 121* 44* 14    CBC: Recent Labs  Lab 12/11/2018 0940 12/28/18 0324 12/28/18 0956  12/29/18 0601 12/30/18 0227 12/31/18 0507  WBC 17.5* 16.6*  --  14.8* 15.9* 14.0*  NEUTROABS 14.5*  --   --   --   --  10.6*  HGB 12.7 11.9* 12.6 12.3 10.6* 10.3*  HCT 40.6 38.4  --  39.6 35.0* 33.7*  MCV 75.6* 76.5*  --  77.0* 77.8* 77.6*  PLT 395 311  --  259 237 214    Cardiac Enzymes: No results for input(s): CKTOTAL, CKMB, CKMBINDEX, TROPONINI in the last 168 hours.  BNP: Invalid input(s): POCBNP  CBG: Recent Labs  Lab 12/30/18 1711 12/30/18 1915 12/30/18 2307 12/31/18 0305 12/31/18 0736  GLUCAP 98 90 122* 155* 142*    Microbiology: Results for orders  placed or performed during the hospital encounter of 12/01/2018  Blood culture (routine x 2)     Status: None (Preliminary result)   Collection Time: 12/25/2018  9:40 AM   Specimen: BLOOD  Result Value Ref Range Status   Specimen Description BLOOD LEFT ANTECUBITAL  Final   Special Requests   Final    BOTTLES DRAWN AEROBIC AND ANAEROBIC Blood Culture adequate volume   Culture   Final    NO GROWTH 4 DAYS Performed at [facility], [address] [place], [place] [address]    Report Status PENDING  Incomplete  Blood culture (routine x 2)     Status: None (Preliminary result)   Collection Time: 12/15/2018  9:51 AM   Specimen: BLOOD LEFT HAND  Result Value Ref Range Status   Specimen Description BLOOD LEFT HAND  Final   Special Requests   Final    BOTTLES DRAWN AEROBIC AND ANAEROBIC Blood Culture results may not be optimal due to an inadequate volume of blood received in culture bottles   Culture   Final    NO GROWTH 4 DAYS Performed at [facility], [address] [place], [place] [address]    Report Status PENDING  Incomplete  SARS Coronavirus 2 (CEPHEID - Performed in [place] hospital lab), Hosp Order     Status: None   Collection Time: 12/28/2018 10:24 AM   Specimen: Nasopharyngeal Swab  Result Value Ref Range Status   SARS Coronavirus 2 NEGATIVE NEGATIVE Final    Comment: (NOTE) If  result is NEGATIVE SARS-CoV-2 target nucleic acids are NOT DETECTED. The SARS-CoV-2 RNA is generally detectable in upper and lower  respiratory specimens during the acute phase of infection. The lowest  concentration of SARS-CoV-2 viral copies this assay can detect is 250  copies / mL. A negative result does not preclude SARS-CoV-2 infection  and should not be used as the sole basis for treatment or other  patient management decisions.  A negative result may occur with  improper specimen collection / handling, submission of specimen other  than nasopharyngeal swab, presence of viral mutation(s) within the  areas targeted by this assay, and inadequate number of viral copies  (<250 copies / mL). A negative result must be combined with clinical  observations, patient history, and epidemiological information. If result is POSITIVE SARS-CoV-2 target nucleic acids are DETECTED. The SARS-CoV-2 RNA is generally detectable in upper and lower  respiratory specimens dur ing the acute phase of infection.  Positive  results are indicative of active infection with SARS-CoV-2.  Clinical  correlation with patient history and other diagnostic information is  necessary to determine patient infection status.  Positive results do  not rule out bacterial infection or co-infection with other viruses. If result is PRESUMPTIVE POSTIVE SARS-CoV-2 nucleic acids MAY BE PRESENT.   A presumptive positive result was obtained on the submitted specimen  and confirmed on repeat testing.  While 2019 novel coronavirus  (SARS-CoV-2) nucleic acids may be present in the submitted sample  additional confirmatory testing may be necessary for epidemiological  and / or clinical management purposes  to differentiate between  SARS-CoV-2 and other Sarbecovirus currently known to infect humans.  If clinically indicated additional testing with an alternate test  methodology [phone]) is advised. The SARS-CoV-2 RNA is generally   detectable in upper and lower respiratory sp ecimens during the acute  phase of infection. The expected result is Negative. Fact Sheet for Patients:  StrictlyIdeas.no Fact Sheet for Healthcare Providers: BankingDealers.co.za  This test is not yet approved or cleared by the [place] and has been authorized for detection and/or diagnosis of SARS-CoV-2 by FDA under an Emergency Use Authorization (EUA).  This EUA will remain in effect (meaning this test can be used) for the duration of the COVID-19 declaration under Section 564(b)(1) of the Act, 21 U.S.C. section 360bbb-3(b)(1), unless the authorization is terminated or revoked sooner. Performed at [facility], [address] [place], [place] [address]   MRSA PCR Screening     Status: None   Collection Time: 12/01/2018  3:29 PM   Specimen: Nasal Mucosa; Nasopharyngeal  Result Value Ref Range Status   MRSA by PCR NEGATIVE NEGATIVE Final    Comment:        The GeneXpert MRSA Assay (FDA approved for NASAL specimens only), is one component of a comprehensive MRSA colonization surveillance program. It is not intended to diagnose MRSA infection nor to guide or monitor treatment for MRSA infections. Performed at [facility], [address], [place], [place] [address]   Urine culture     Status: None   Collection Time: 12/01/2018  3:40 PM   Specimen: Urine, Random  Result Value Ref Range Status   Specimen Description   Final    URINE, RANDOM Performed at [facility], [address], [place], [place] [address]    Special Requests   Final    NONE Performed at [facility], [address] [place], [place] [address]    Culture   Final    NO GROWTH Performed at [facility], [place] [address] [place], [place] [address]    Report Status 12/28/2018 FINAL  Final  Gastrointestinal Panel by PCR , Stool     Status: None   Collection Time:  12/29/18  8:51 PM   Specimen: Stool  Result Value Ref Range Status   Campylobacter species NOT DETECTED NOT DETECTED Final   Plesimonas shigelloides NOT DETECTED NOT DETECTED Final   Salmonella species NOT DETECTED NOT DETECTED Final   Yersinia enterocolitica NOT DETECTED NOT DETECTED Final   Vibrio species NOT DETECTED NOT DETECTED Final   Vibrio cholerae NOT DETECTED NOT DETECTED Final   Enteroaggregative E coli (EAEC) NOT DETECTED NOT DETECTED Final   Enteropathogenic E coli (EPEC) NOT DETECTED NOT DETECTED Final   Enterotoxigenic E coli (ETEC) NOT DETECTED NOT DETECTED Final   Shiga like toxin producing E coli (STEC) NOT DETECTED NOT DETECTED Final   Shigella/Enteroinvasive E coli (EIEC) NOT DETECTED NOT DETECTED Final   Cryptosporidium NOT DETECTED NOT DETECTED Final   Cyclospora cayetanensis NOT DETECTED NOT DETECTED Final   Entamoeba histolytica NOT DETECTED NOT DETECTED Final   Giardia lamblia NOT DETECTED NOT DETECTED Final   Adenovirus F40/41 NOT DETECTED NOT DETECTED Final   Astrovirus NOT DETECTED NOT DETECTED Final   Norovirus GI/GII NOT DETECTED NOT DETECTED Final   Rotavirus A NOT DETECTED NOT DETECTED Final   Sapovirus (I, II, IV, and V) NOT DETECTED NOT DETECTED Final    Comment: Performed at [facility], [address] [place], [place] [address]  C difficile quick scan w PCR reflex     Status: None   Collection Time: 12/29/18  8:51 PM   Specimen: STOOL  Result Value Ref Range Status   C Diff antigen NEGATIVE NEGATIVE Final   C Diff toxin NEGATIVE NEGATIVE Final   C Diff interpretation No C. difficile detected.  Final    Comment: Performed at [facility], [address] [place], [place] [address]    Coagulation Studies: No results for input(s): LABPROT, INR in the last 72 hours.  Urinalysis:  Recent Labs  Lab 12/20/2018 1540  COLORURINE AMBER*  LABSPEC 1.018  PHURINE 5.0  GLUCOSEU NEGATIVE  HGBUR LARGE*  BILIRUBINUR NEGATIVE   KETONESUR 5*  PROTEINUR 100*  NITRITE NEGATIVE  LEUKOCYTESUR NEGATIVE    Lipid Panel:  No results found for: CHOL, TRIG, HDL, CHOLHDL, VLDL, LDLCALC  HgbA1C: No results found for: HGBA1C  Urine Drug Screen:  No results found for: LABOPIA, COCAINSCRNUR, LABBENZ, AMPHETMU, THCU, LABBARB  Alcohol Level: No results for input(s): ETH in the last 168 hours.  Other results: EKG: sinus tachycardia at 130 bpm.  Imaging: CT HEAD WITHOUT CONTRAST  TECHNIQUE: Contiguous axial images were obtained from the base of the skull through the vertex without intravenous contrast.  COMPARISON:  None.  FINDINGS: Brain: Ventricles and sulci are within normal limits for age. There is no demonstrable intracranial mass, hemorrhage, extra-axial fluid collection, or midline shift. There is slight small vessel disease in the centra semiovale bilaterally. No acute infarct evident.  Vascular: No hyperdense vessel. There are foci of calcification in each carotid siphon region.  Skull: The bony calvarium appears intact. No neoplastic focus involving bony structures.  Sinuses/Orbits: Visualized paranasal sinuses are clear. Visualized orbits appear symmetric bilaterally.  Other: Mastoid air cells are clear.  IMPRESSION: Mild periventricular small vessel disease. No acute infarct. No mass or hemorrhage.  There are foci of arterial vascular calcification.  Assessment/Plan: 73 y.o. female admitted 6/29 with altered mental status.  Patient with newly diagnosed [place] who underwent first cycle of immunotherapy and targeted therapy with Tecentriq and Avastin on 12/23/2018.  Had multiple episodes of diarrhea, disorientated and unresponsive lead ing to admission.  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  Questionable sepsis.  Patient hypothermic.  Has been on Cefepime, Zosyn and Vancomycin.  No improvement in mental status noted despite  improvement in lab work although not back to baseline.   Head CT reviewed and unremarkable.   Differential large due to immunosuppression.  Although mental status likely multifactorial with some contribution from multiple metabolic issues (cognitive improvement often lags behind improvement in lab work) with hypothermia and immunosuppression can not rule out atypical infection, etc.  Seizure on the differential as well.    Recommendations: 1. EEG 2. Agree with MRI of the brain 3. If above unremarkable, LP should be performed.  Would hold sq heparin in preparation.   4. Frequent neuro checks 5. Thiamine level   Alexis Goodell, MD Neurology [phone] 12/31/2018, 2:52 PM

## 2019-01-01 LAB — COMPREHENSIVE METABOLIC PANEL WITH GFR
ALT: 45 U/L — ABNORMAL HIGH (ref 0–44)
AST: 68 U/L — ABNORMAL HIGH (ref 15–41)
Albumin: 2.2 g/dL — ABNORMAL LOW (ref 3.5–5.0)
Alkaline Phosphatase: 310 U/L — ABNORMAL HIGH (ref 38–126)
Anion gap: 9 (ref 5–15)
BUN: 27 mg/dL — ABNORMAL HIGH (ref 8–23)
CO2: 23 mmol/L (ref 22–32)
Calcium: 8.6 mg/dL — ABNORMAL LOW (ref 8.9–10.3)
Chloride: 115 mmol/L — ABNORMAL HIGH (ref 98–111)
Creatinine, Ser: 0.97 mg/dL (ref 0.44–1.00)
GFR calc Af Amer: >60 mL/min
GFR calc non Af Amer: 59 mL/min — ABNORMAL LOW
Glucose, Bld: 198 mg/dL — ABNORMAL HIGH (ref 70–99)
Potassium: 4.4 mmol/L (ref 3.5–5.1)
Sodium: 147 mmol/L — ABNORMAL HIGH (ref 135–145)
Total Bilirubin: 1.8 mg/dL — ABNORMAL HIGH (ref 0.3–1.2)
Total Protein: 6 g/dL — ABNORMAL LOW (ref 6.5–8.1)

## 2019-01-01 LAB — GLUCOSE, CAPILLARY
Glucose-Capillary: 185 mg/dL — ABNORMAL HIGH (ref 70–99)
Glucose-Capillary: 205 mg/dL — ABNORMAL HIGH (ref 70–99)
Glucose-Capillary: 180 mg/dL — ABNORMAL HIGH (ref 70–99)
Glucose-Capillary: 192 mg/dL — ABNORMAL HIGH (ref 70–99)
Glucose-Capillary: 165 mg/dL — ABNORMAL HIGH (ref 70–99)

## 2019-01-01 LAB — CBC WITH DIFFERENTIAL/PLATELET
Abs Immature Granulocytes: 1.39 K/uL — ABNORMAL HIGH (ref 0.00–0.07)
Basophils Absolute: 0.1 K/uL (ref 0.0–0.1)
Basophils Relative: 0 %
Eosinophils Absolute: 0 K/uL (ref 0.0–0.5)
Eosinophils Relative: 0 %
HCT: 35.1 % — ABNORMAL LOW (ref 36.0–46.0)
Hemoglobin: 10.8 g/dL — ABNORMAL LOW (ref 12.0–15.0)
Immature Granulocytes: 8 %
Lymphocytes Relative: 7 %
Lymphs Abs: 1.4 K/uL (ref 0.7–4.0)
MCH: 23.6 pg — ABNORMAL LOW (ref 26.0–34.0)
MCHC: 30.8 g/dL (ref 30.0–36.0)
MCV: 76.6 fL — ABNORMAL LOW (ref 80.0–100.0)
Monocytes Absolute: 0.8 K/uL (ref 0.1–1.0)
Monocytes Relative: 4 %
Neutro Abs: 14.5 K/uL — ABNORMAL HIGH (ref 1.7–7.7)
Neutrophils Relative %: 81 %
Platelets: 198 K/uL (ref 150–400)
RBC: 4.58 MIL/uL (ref 3.87–5.11)
RDW: 21.6 % — ABNORMAL HIGH (ref 11.5–15.5)
Smear Review: NORMAL
WBC: 18.1 K/uL — ABNORMAL HIGH (ref 4.0–10.5)
nRBC: 3.6 % — ABNORMAL HIGH (ref 0.0–0.2)

## 2019-01-01 LAB — CULTURE, BLOOD (ROUTINE X 2)
Culture: NO GROWTH
Culture: NO GROWTH
Special Requests: ADEQUATE

## 2019-01-01 LAB — HEPATITIS B DNA, ULTRAQUANTITATIVE, PCR
HBV DNA SERPL PCR-ACNC: NOT DETECTED IU/mL
HBV DNA SERPL PCR-LOG IU: UNDETERMINED log10 IU/mL

## 2019-01-01 LAB — PROCALCITONIN: Procalcitonin: 0.48 ng/mL

## 2019-01-01 LAB — HEPATITIS C VRS RNA DETECT BY PCR-QUAL: Hepatitis C Vrs RNA by PCR-Qual: NEGATIVE

## 2019-01-01 NOTE — Progress Notes (Signed)
[facility] Hematology/Oncology Progress Note  Date of admission: 12/28/2018  Hospital day:  01/01/2019  Chief Complaint: Abigail Ayers is a 73 y.o. female with hepatocellular carcinoma who was admitted with altered mental status.  Subjective:  Patient resting quietly on sedation (Precedex) per nursing.  Earlier, patient agitated and moving about in bed.  Spoke a few words to nurse.  Social History: Patient is accompanied by a Actuary.  Allergies:  Allergies  Allergen Reactions  . Lisinopril Other (See Comments)    Other reaction(s): Cough    Scheduled Medications: . fentaNYL  1 patch Transdermal Q72H  . methylPREDNISolone (SOLU-MEDROL) injection  50 mg Intravenous Q8H  . pantoprazole (PROTONIX) IV  40 mg Intravenous Q12H  . sodium chloride flush  10-40 mL Intracatheter Q12H    Review of Systems: Unable to be obtained.  Physical Exam: Blood pressure (!) 165/85, pulse 67, temperature 98.9 F (37.2 C), temperature source Oral, resp. rate 12, height 5' 0.5" (1.537 m), weight 165 lb 2 oz (74.9 kg), SpO2 100 %.  GENERAL:  Chronically ill appearing lying comfortably in the medical ICU no acute distress. MENTAL STATUS:  Grimaces to sternal rub. HEAD:  Graying hair.  Normocephalic, atraumatic, face symmetric, no Cushingoid features. EYES:  Brown eyes.  Pupils equal round and reactive to light and accomodation.  No conjunctivitis or scleral icterus. ENT:  [place] in place.  Oropharynx clear without lesion.  Tongue normal. Mucous membranes dry.  RESPIRATORY:  Clear to auscultation anteriorly without rales, wheezes or rhonchi. CARDIOVASCULAR:  Regular rate and rhythm without murmur, rub or gallop. ABDOMEN:  Soft, non-tender, with active bowel sounds, and no hepatosplenomegaly.  No masses. SKIN:  No rashes, ulcers or lesions. EXTREMITIES: No edema, no skin discoloration or tenderness.  No palpable cords. LYMPH NODES: No palpable cervical, supraclavicular, axillary or  inguinal adenopathy  NEUROLOGICAL: Sedated.   Results for orders placed or performed during the hospital encounter of 12/16/2018 (from the past 48 hour(s))  Glucose, capillary     Status: Abnormal   Collection Time: 12/30/18  3:33 PM  Result Value Ref Range   Glucose-Capillary 67 (L) 70 - 99 mg/dL  Potassium     Status: None   Collection Time: 12/30/18  3:55 PM  Result Value Ref Range   Potassium 4.0 3.5 - 5.1 mmol/L    Comment: Performed at [facility], [address] [place], [place] [address]  Glucose, capillary     Status: None   Collection Time: 12/30/18  5:11 PM  Result Value Ref Range   Glucose-Capillary 98 70 - 99 mg/dL  Glucose, capillary     Status: None   Collection Time: 12/30/18  7:15 PM  Result Value Ref Range   Glucose-Capillary 90 70 - 99 mg/dL   Comment 1 Notify RN   Glucose, capillary     Status: Abnormal   Collection Time: 12/30/18 11:07 PM  Result Value Ref Range   Glucose-Capillary 122 (H) 70 - 99 mg/dL   Comment 1 Notify RN   Glucose, capillary     Status: Abnormal   Collection Time: 12/31/18  3:05 AM  Result Value Ref Range   Glucose-Capillary 155 (H) 70 - 99 mg/dL   Comment 1 Notify RN   Procalcitonin     Status: None   Collection Time: 12/31/18  5:07 AM  Result Value Ref Range   Procalcitonin 0.90 ng/mL    Comment:        Interpretation: PCT > 0.5 ng/mL and <=  2 ng/mL: Systemic infection (sepsis) is possible, but other conditions are known to elevate PCT as well. (NOTE)       Sepsis PCT Algorithm           Lower Respiratory Tract                                      Infection PCT Algorithm    ----------------------------     ----------------------------         PCT < 0.25 ng/mL                PCT < 0.10 ng/mL         Strongly encourage             Strongly discourage   discontinuation of antibiotics    initiation of antibiotics    ----------------------------     -----------------------------       PCT 0.25 - 0.50 ng/mL             PCT 0.10 - 0.25 ng/mL               OR       >80% decrease in PCT            Discourage initiation of                                            antibiotics      Encourage discontinuation           of antibiotics    ----------------------------     -----------------------------         PCT >= 0.50 ng/mL              PCT 0.26 - 0.50 ng/mL                AND       <80% decrease in PCT             Encourage initiation of                                             antibiotics       Encourage continuation           of antibiotics    ----------------------------     -----------------------------        PCT >= 0.50 ng/mL                  PCT > 0.50 ng/mL               AND         increase in PCT                  Strongly encourage                                      initiation of antibiotics    Strongly encourage escalation           of antibiotics                                     -----------------------------  PCT <= 0.25 ng/mL                                                 OR                                        > 80% decrease in PCT                                     Discontinue / Do not initiate                                             antibiotics Performed at [facility], [address] [place], [place] [address]   CBC with Differential/Platelet     Status: Abnormal   Collection Time: 12/31/18  5:07 AM  Result Value Ref Range   WBC 14.0 (H) 4.0 - 10.5 K/uL   RBC 4.34 3.87 - 5.11 MIL/uL   Hemoglobin 10.3 (L) 12.0 - 15.0 g/dL   HCT 33.7 (L) 36.0 - 46.0 %   MCV 77.6 (L) 80.0 - 100.0 fL   MCH 23.7 (L) 26.0 - 34.0 pg   MCHC 30.6 30.0 - 36.0 g/dL   RDW 21.4 (H) 11.5 - 15.5 %   Platelets 214 150 - 400 K/uL   nRBC 4.2 (H) 0.0 - 0.2 %   Neutrophils Relative % 75 %   Neutro Abs 10.6 (H) 1.7 - 7.7 K/uL   Lymphocytes Relative 7 %   Lymphs Abs 1.0 0.7 - 4.0 K/uL   Monocytes Relative 7 %   Monocytes Absolute 1.0 0.1 -  1.0 K/uL   Eosinophils Relative 1 %   Eosinophils Absolute 0.1 0.0 - 0.5 K/uL   Basophils Relative 1 %   Basophils Absolute 0.1 0.0 - 0.1 K/uL   WBC Morphology MILD LEFT SHIFT (1-5% METAS, OCC MYELO, OCC BANDS)    Smear Review PLATELET CLUMPS NOTED ON SMEAR, UNABLE TO ESTIMATE    Immature Granulocytes 9 %   Abs Immature Granulocytes 1.19 (H) 0.00 - 0.07 K/uL   Polychromasia PRESENT    Target Cells PRESENT     Comment: Performed at [facility], [address] [place], [place] [address]  Comprehensive metabolic panel     Status: Abnormal   Collection Time: 12/31/18  5:07 AM  Result Value Ref Range   Sodium 144 135 - 145 mmol/L   Potassium 3.8 3.5 - 5.1 mmol/L   Chloride 115 (H) 98 - 111 mmol/L   CO2 22 22 - 32 mmol/L   Glucose, Bld 171 (H) 70 - 99 mg/dL   BUN 31 (H) 8 - 23 mg/dL   Creatinine, Ser 0.92 0.44 - 1.00 mg/dL   Calcium 8.6 (L) 8.9 - 10.3 mg/dL   Total Protein 5.7 (L) 6.5 - 8.1 g/dL   Albumin 2.1 (L) 3.5 - 5.0 g/dL   AST 80 (H) 15 - 41 U/L   ALT 51 (H) 0 - 44 U/L   Alkaline Phosphatase 246 (H) 38 - 126 U/L   Total Bilirubin 1.7 (  H) 0.3 - 1.2 mg/dL   GFR calc non Af Amer >60 >60 mL/min   GFR calc Af Amer >60 >60 mL/min   Anion gap 7 5 - 15    Comment: Performed at [facility], [address] [place], [place] [address]  Magnesium     Status: None   Collection Time: 12/31/18  5:07 AM  Result Value Ref Range   Magnesium 2.3 1.7 - 2.4 mg/dL    Comment: Performed at [facility], [address] [place], [place] [address]  Phosphorus     Status: Abnormal   Collection Time: 12/31/18  5:07 AM  Result Value Ref Range   Phosphorus 2.4 (L) 2.5 - 4.6 mg/dL    Comment: Performed at [facility], [address] [place], [place] [address]  Glucose, capillary     Status: Abnormal   Collection Time: 12/31/18  7:36 AM  Result Value Ref Range   Glucose-Capillary 142 (H) 70 - 99 mg/dL  Ferritin     Status: Abnormal   Collection Time:  12/31/18  5:38 PM  Result Value Ref Range   Ferritin 1,625 (H) 11 - 307 ng/mL    Comment: Performed at [facility], [address] [place], [place] [address]  Glucose, capillary     Status: Abnormal   Collection Time: 12/31/18  8:21 PM  Result Value Ref Range   Glucose-Capillary 156 (H) 70 - 99 mg/dL  Glucose, capillary     Status: Abnormal   Collection Time: 12/31/18 11:14 PM  Result Value Ref Range   Glucose-Capillary 163 (H) 70 - 99 mg/dL  Glucose, capillary     Status: Abnormal   Collection Time: 01/01/19  3:18 AM  Result Value Ref Range   Glucose-Capillary 185 (H) 70 - 99 mg/dL  Procalcitonin     Status: None   Collection Time: 01/01/19  4:48 AM  Result Value Ref Range   Procalcitonin 0.48 ng/mL    Comment:        Interpretation: PCT (Procalcitonin) <= 0.5 ng/mL: Systemic infection (sepsis) is not likely. Local bacterial infection is possible. (NOTE)       Sepsis PCT Algorithm           Lower Respiratory Tract                                      Infection PCT Algorithm    ----------------------------     ----------------------------         PCT < 0.25 ng/mL                PCT < 0.10 ng/mL         Strongly encourage             Strongly discourage   discontinuation of antibiotics    initiation of antibiotics    ----------------------------     -----------------------------       PCT 0.25 - 0.50 ng/mL            PCT 0.10 - 0.25 ng/mL               OR       >80% decrease in PCT            Discourage initiation of  antibiotics      Encourage discontinuation           of antibiotics    ----------------------------     -----------------------------         PCT >= 0.50 ng/mL              PCT 0.26 - 0.50 ng/mL               AND        <80% decrease in PCT             Encourage initiation of                                             antibiotics       Encourage continuation           of antibiotics     ----------------------------     -----------------------------        PCT >= 0.50 ng/mL                  PCT > 0.50 ng/mL               AND         increase in PCT                  Strongly encourage                                      initiation of antibiotics    Strongly encourage escalation           of antibiotics                                     -----------------------------                                           PCT <= 0.25 ng/mL                                                 OR                                        > 80% decrease in PCT                                     Discontinue / Do not initiate                                             antibiotics Performed at [facility], [address] [place], [place] [address]   CBC with Differential/Platelet     Status: Abnormal   Collection Time: 01/01/19  4:48 AM  Result Value Ref Range   WBC 18.1 (H) 4.0 - 10.5 K/uL   RBC 4.58 3.87 - 5.11 MIL/uL   Hemoglobin 10.8 (L) 12.0 - 15.0 g/dL   HCT 35.1 (L) 36.0 - 46.0 %   MCV 76.6 (L) 80.0 - 100.0 fL   MCH 23.6 (L) 26.0 - 34.0 pg   MCHC 30.8 30.0 - 36.0 g/dL   RDW 21.6 (H) 11.5 - 15.5 %   Platelets 198 150 - 400 K/uL   nRBC 3.6 (H) 0.0 - 0.2 %   Neutrophils Relative % 81 %   Neutro Abs 14.5 (H) 1.7 - 7.7 K/uL   Lymphocytes Relative 7 %   Lymphs Abs 1.4 0.7 - 4.0 K/uL   Monocytes Relative 4 %   Monocytes Absolute 0.8 0.1 - 1.0 K/uL   Eosinophils Relative 0 %   Eosinophils Absolute 0.0 0.0 - 0.5 K/uL   Basophils Relative 0 %   Basophils Absolute 0.1 0.0 - 0.1 K/uL   WBC Morphology MILD LEFT SHIFT (1-5% METAS, OCC MYELO, OCC BANDS)    Smear Review Normal platelet morphology    Immature Granulocytes 8 %   Abs Immature Granulocytes 1.39 (H) 0.00 - 0.07 K/uL   Polychromasia PRESENT    Target Cells PRESENT     Comment: Performed at [facility], [address] [place], [place] [address]  Comprehensive metabolic panel     Status: Abnormal    Collection Time: 01/01/19  4:48 AM  Result Value Ref Range   Sodium 147 (H) 135 - 145 mmol/L   Potassium 4.4 3.5 - 5.1 mmol/L   Chloride 115 (H) 98 - 111 mmol/L   CO2 23 22 - 32 mmol/L   Glucose, Bld 198 (H) 70 - 99 mg/dL   BUN 27 (H) 8 - 23 mg/dL   Creatinine, Ser 0.97 0.44 - 1.00 mg/dL   Calcium 8.6 (L) 8.9 - 10.3 mg/dL   Total Protein 6.0 (L) 6.5 - 8.1 g/dL   Albumin 2.2 (L) 3.5 - 5.0 g/dL   AST 68 (H) 15 - 41 U/L   ALT 45 (H) 0 - 44 U/L   Alkaline Phosphatase 310 (H) 38 - 126 U/L   Total Bilirubin 1.8 (H) 0.3 - 1.2 mg/dL   GFR calc non Af Amer 59 (L) >60 mL/min   GFR calc Af Amer >60 >60 mL/min   Anion gap 9 5 - 15    Comment: Performed at [facility], [address] [place], [place] [address]  Glucose, capillary     Status: Abnormal   Collection Time: 01/01/19  7:21 AM  Result Value Ref Range   Glucose-Capillary 205 (H) 70 - 99 mg/dL  Glucose, capillary     Status: Abnormal   Collection Time: 01/01/19 11:43 AM  Result Value Ref Range   Glucose-Capillary 180 (H) 70 - 99 mg/dL   Mr Brain Wo Contrast  Result Date: 12/31/2018 CLINICAL DATA:  73 year old female with altered mental status, suspected sepsis. Liver tumor. EXAM: MRI HEAD WITHOUT CONTRAST TECHNIQUE: Multiplanar, multiecho pulse sequences of the brain and surrounding structures were obtained without intravenous contrast. COMPARISON:  Head CT 12/07/2018 and earlier. FINDINGS: Study is intermittently degraded by motion artifact despite repeated imaging attempts. Brain: No restricted diffusion to suggest acute infarction. No midline shift, mass effect, evidence of mass lesion, ventriculomegaly, extra-axial collection or acute intracranial hemorrhage. Cervicomedullary junction and pituitary are within normal limits. Scattered bilateral cerebral white matter T2 and FLAIR hyperintensity, mild to  moderate for age. No cortical encephalomalacia or chronic cerebral blood products identified. No intrinsic increased T1 signal  in the deep gray matter nuclei. The basal ganglia, thalami, brainstem, and cerebellum appear normal for age. Vascular: Major intracranial vascular flow voids are preserved. Skull and upper cervical spine: Negative visible cervical spine. Visible bone marrow signal appears within normal limits. Sinuses/Orbits: Negative orbits. Paranasal sinuses and mastoids are stable and well pneumatized. Other: Scalp and face soft tissues appear negative. IMPRESSION: 1.  No acute intracranial abnormality. 2. Mild to moderate for age cerebral white matter signal changes, most commonly due to chronic small vessel disease. Electronically Signed   By: Genevie Ann M.D.   On: 12/31/2018 16:09   Mr 3d Recon At Scanner  Result Date: 12/31/2018 CLINICAL DATA:  Inpatient. 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 Recent initiation of chemotherapy. Patient admitted with abnormal liver function tests, sepsis, altered mental status. EXAM: MRI ABDOMEN WITHOUT AND WITH CONTRAST (INCLUDING MRCP) TECHNIQUE: Multiplanar multisequence MR imaging of the abdomen was performed both before and after the administration of intravenous contrast. Heavily T2-weighted images of the biliary and pancreatic ducts were obtained, and three-dimensional MRCP images were rendered by post processing. CONTRAST:  7 cc Gadavist IV. COMPARISON:  11/11/2018 MRI abdomen. FINDINGS: Lower chest: Small dependent bilateral pleural effusions, right greater than left, new from prior MRI. Passive atelectasis at the dependent lung bases bilaterally. Hepatobiliary: There are multiple heterogeneously enhancing poorly defined masses scattered throughout the right greater than left liver lobes, most of which are new since 11/11/2018 MRI. For example, right liver dome 2.5 x 2.0 cm mass (series 19/image 18), segment 7 right liver lobe 2.7 x 2.2 cm mass (series 19/image 24), peripheral right liver lobe 3.6 x 2.0 cm mass (series  19/image 28) and segment 2 left liver lobe 1.7 x 1.1 cm mass (series 19/image 29) are all new. Segment 8 right liver lobe 2.4 x 2.1 cm mass (series 19/image 22), previously 2.4 x 2.4 cm, not appreciably changed. 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 Cholecystectomy. No biliary ductal dilatation. Common bile duct diameter 4 mm. No evidence of choledocholithiasis. Pancreas: Mild diffuse peripancreatic edema. No pancreatic mass or duct dilation. Spleen: Normal size. No mass. Adrenals/Urinary Tract: Normal adrenals. No hydronephrosis. Normal kidneys with no renal mass. Stomach/Bowel: Normal non-distended stomach. Normal caliber visualized bowel loops. Generalized wall thickening in visualized small and large bowel. Vascular/Lymphatic: Nonaneurysmal abdominal aorta. Patent hepatic, splenic and renal veins. Patent SMV. No pathologically enlarged lymph nodes in the abdomen. Other: Small volume perihepatic ascites.  No focal fluid collection. Musculoskeletal: No aggressive appearing focal osseous lesions. IMPRESSION: 1. Several heterogeneously enhancing poorly defined liver masses scattered throughout the right greater than left liver lobes, most of which are new since 11/11/2018 MRI. Findings are compatible with progression of multifocal hepatocellular carcinoma. Previously visualized solitary segment 8 right liver mass is stable. 2. 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 3. Small volume perihepatic ascites. 4. Generalized bowel wall thickening and mild peripancreatic edema, nonspecific, potentially due to a portal hypertension and/or hypoproteinemia. 5. Small dependent bilateral pleural effusions, right greater than left, new. Electronically Signed   By: Ilona Sorrel M.D.   On: 12/31/2018 19:46   Mr  Abdomen Mrcp Moise Boring Contast  Result Date: 12/31/2018 CLINICAL DATA:  Inpatient. 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 Recent initiation of chemotherapy. Patient admitted with abnormal liver function tests, sepsis, altered mental status. EXAM: MRI ABDOMEN WITHOUT AND WITH CONTRAST (INCLUDING MRCP) TECHNIQUE: Multiplanar multisequence MR imaging of the abdomen was performed both before and after the administration of intravenous contrast. Heavily T2-weighted images of the biliary and pancreatic ducts were obtained, and three-dimensional MRCP images were rendered by post processing. CONTRAST:  7 cc Gadavist IV. COMPARISON:  11/11/2018 MRI abdomen. FINDINGS: Lower chest: Small dependent bilateral pleural effusions, right greater than left, new from prior MRI. Passive atelectasis at the dependent lung bases bilaterally. Hepatobiliary: There are multiple heterogeneously enhancing poorly defined masses scattered throughout the right greater than left liver lobes, most of which are new since 11/11/2018 MRI. For example, right liver dome 2.5 x 2.0 cm mass (series 19/image 18), segment 7 right liver lobe 2.7 x 2.2 cm mass (series 19/image 24), peripheral right liver lobe 3.6 x 2.0 cm mass (series 19/image 28) and segment 2 left liver lobe 1.7 x 1.1 cm mass (series 19/image 29) are all new. Segment 8 right liver lobe 2.4 x 2.1 cm mass (series 19/image 22), previously 2.4 x 2.4 cm, not appreciably changed. 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 Cholecystectomy. No biliary ductal dilatation. Common bile duct diameter 4 mm. No evidence of choledocholithiasis. Pancreas: Mild diffuse peripancreatic edema. No pancreatic mass or duct dilation. Spleen: Normal size. No mass. Adrenals/Urinary Tract: Normal adrenals. No  hydronephrosis. Normal kidneys with no renal mass. Stomach/Bowel: Normal non-distended stomach. Normal caliber visualized bowel loops. Generalized wall thickening in visualized small and large bowel. Vascular/Lymphatic: Nonaneurysmal abdominal aorta. Patent hepatic, splenic and renal veins. Patent SMV. No pathologically enlarged lymph nodes in the abdomen. Other: Small volume perihepatic ascites.  No focal fluid collection. Musculoskeletal: No aggressive appearing focal osseous lesions. IMPRESSION: 1. Several heterogeneously enhancing poorly defined liver masses scattered throughout the right greater than left liver lobes, most of which are new since 11/11/2018 MRI. Findings are compatible with progression of multifocal hepatocellular carcinoma. Previously visualized solitary segment 8 right liver mass is stable. 2. 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 3. Small volume perihepatic ascites. 4. Generalized bowel wall thickening and mild peripancreatic edema, nonspecific, potentially due to a portal hypertension and/or hypoproteinemia. 5. Small dependent bilateral pleural effusions, right greater than left, new. Electronically Signed   By: Ilona Sorrel M.D.   On: 12/31/2018 19:46    Assessment:  Abigail Ayers is a 73 y.o. 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  She was admitted with altered mental status.  Abdomen MRI on 12/31/2018 revealed several heterogeneously enhancing poorly defined liver masses scattered throughout the right > left liver lobes, most of which are new since 11/11/2018. Findings are compatible with progression of multifocal hepatocellular carcinoma. Previously visualized solitary segment 8 right liver mass was stable.  There was near occlusive tumor thrombus in the distal main portal vein and right and left portal veins, slightly decreased.  There was small volume  perihepatic ascites.   Head MRI on 12/31/2018 revealed no acute intracranial abnormality.  There was mild to moderate for age cerebral white matter signal changes, most commonly due to chronic small vessel disease.  Symptomatically, she is agitated when her sedation is  lifted.  She remains confused.  Plan: 1. Hepatocellular carcinoma  Patient has a history of hepatitis C s/p Harvoni.  She is s/p cycle #1 Tecentriq and Avastin on 12/23/2018.   No evidence of hypophysitis, hypothyroidism or adrenal insufficiency.    ACTH, TSH, free T4, and cortisol normal.  Unclear if change in mentation secondary to Tecentriq (see below).  Abdomen MRI on 12/31/2018 revealed progression of multifocal hepatocellular carcinoma compared to 11/11/2018 imaging.  2. Diarrhea  Patient initially noted to have multiple episodes of diarrhea.  No stool today.  Semi-formed stool yesterday.  Stool was negative for C diff and GI panel.   Holding lactulose to prevent diarrhea. 3. Elevated liver function tests, improving  12/28/2018:  AST 419, ALT 103, bilirubin 2.8, alkaline phosphatase 222.  Ammonia 44.   12/31/2018:  AST 80, ALT 51, bilirubin 1.7, alkaline phosphatase 246.  Ammonia 14 on 12/30/2018.  01/01/2019:  AST 68, ALT 45, builirubin 1.8, alkaline phosphatase 310.  Hepatitis B DNA and hepatitis C RNA was negative. 4. Presumed sepsis from unknown source  Patient was empirically treated with vancomycin and Cefepime.  She is currently on Zosyn.  Cultures negative to date.  Concern for hypothermia (?autonomic dysregulation).  Appreciate ID consultation.   Doubt bacterial meningitis.   HSV encephalitis unlikely as no temporal lobe enhancement and no PLEDs.   Lumbar puncture would be helpful.  No contraindication for steroids.   Patient currently on Solumedrol 2 mg/kg/day. 5. Acute renal insufficiency, resolved  12/26/2018:  Creatinine 3.28.  Uric acid 10.6.  12/31/2018:  Creatinine 0.92   Uric acid 0.7 on  12/29/2018.  01/01/2019:  Creatinine 0.97. 6. Encephalopathy  Etiology remains unclear.  Head CT without contrast on 12/20/2018 revealed mild periventricular small vessel disease.  Head MR on 12/31/2018 revealed no acute abnormality.  EEG revealed slowing without seizure activity.  Appreciate neurology consult.  Patient may have a delay recovery from metabolic encephalopathy from liver and kidney abnormalities first noted on admission.  Patient may have atezolizumab [employer], [employer]) induced encephalitis (extremely rare).   Reference:  Atezolizumab-induced encephalitis in metastatic lung cancer:case report and literature review ([employer], Frederich Balding 2019)    Symptoms can occur within 2 weeks of atezolizumab    Symptoms included consciousness disorders.    Diffuse leptomeningeal enhancement or brain lesions in 3 of 4 cases.    CSF reveals high cell and protein counts.     Viral antibodies and paraneoplastic antibodies were negative.    Steroids were used for treatment in article (methylprednisolone 1000 mg a day).  Concern raised for LP given recent Avastin.  Multiple calls to [facility] yesterday and today.  Coordinated care with oncology, medicine, neuro-oncology, neurology.   Patient not felt to be a candidate for transfer at this time.   LP is a risk versus benefit.   There may be a risk for bleeding.   Would ensure no coagulapathy (PT, PTT, normal platelet count) and off prophylactic Lovenox for LP.  Discuss situation in detail with Patient's daughter [name] [phone]).   She plans to talk to siblings to decide about an LP.  Send paraneoplastic panel. 7.   Code status  Altha Harm, NP involved in patient care.  Full code.   Lequita Asal, MD  01/01/2019, 12:30 PM

## 2019-01-01 NOTE — Progress Notes (Signed)
Pharmacy Electrolyte Monitoring Consult:  Pharmacy consulted to assist in monitoring and replacing electrolytes in this 73 y.o. female admitted on 12/24/2018. Patient with  Extensive protal venous thrombosis s/p immunotherapy. Patient admitted with lethargy and AKI. Patient is off vasopressors.   Labs:  Sodium (mmol/L)  Date Value  01/01/2019 147 (H)   Potassium (mmol/L)  Date Value  01/01/2019 4.4   Magnesium (mg/dL)  Date Value  12/31/2018 2.3   Phosphorus (mg/dL)  Date Value  12/31/2018 2.4 (L)   Calcium (mg/dL)  Date Value  01/01/2019 8.6 (L)   Albumin (g/dL)  Date Value  01/01/2019 2.2 (L)    Assessment/Plan: D5/LR at 81mL/hr    Will obtain electrolytes with am labs.   Will replace for goal potassium ~ 4 and goal magnesium ~ 2.   Pharmacy will continue to monitor and adjust per consult.    Dallie Piles, PharmD 01/01/2019 7:19 AM

## 2019-01-01 NOTE — Progress Notes (Signed)
Subjective: Patient remains altered.    Objective: Current vital signs: BP (!) 161/84   Pulse 69   Temp 98.8 F (37.1 C) (Axillary)   Resp 14   Ht 5' 0.5" (1.537 m)   Wt 74.9 kg   SpO2 99%   BMI 31.72 kg/m  Vital signs in last 24 hours: Temp:  [98.3 F (36.8 C)-98.8 F (37.1 C)] 98.8 F (37.1 C) (07/04 0400) Pulse Rate:  [65-80] 69 (07/04 0530) Resp:  [10-18] 14 (07/04 0530) BP: (100-165)/(58-96) 161/84 (07/04 0530) SpO2:  [96 %-100 %] 99 % (07/04 0530)  Intake/Output from previous day: 07/03 0701 - 07/04 0700 In: 1761.5 [I.V.:1617.5; IV Piggyback:143.9] Out: 535 [Urine:535] Intake/Output this shift: No intake/output data recorded. Nutritional status:  Diet Order            Diet NPO time specified  Diet effective now              Neurologic Exam: Mental Status: Patient does not respond to verbal stimuli.  Opens eyes with deep sternal rub.  Does not follow commands.  Groans at times.  Cranial Nerves: II: patient does not respond confrontation bilaterally, pupils right 3 mm, left 3 mm,and reactive bilaterally III,IV,VI: Oculocephalic response present bilaterally.  V,VII: corneal reflex present bilaterally  VIII: patient does not respond to verbal stimuli IX,X: gag reflex reduced, XI: trapezius strength unable to test bilaterally XII: tongue strength unable to test Motor: No purposeful movements noted. Sensory: Does not respond to noxious stimuli in any extremity. Deep Tendon Reflexes:  2+ in the upper extremities and absent in the lower extremities   Lab Results: Basic Metabolic Panel: Recent Labs  Lab 12/12/2018 0940 12/10/2018 1813 12/28/18 0324 12/29/18 0601 12/30/18 0227 12/30/18 1555 12/31/18 0507 01/01/19 0448  NA 135  --  140 141 144  --  144 147*  K 3.6  --  3.7 3.3* 3.0* 4.0 3.8 4.4  CL 95*  --  105 105 111  --  115* 115*  CO2 17*  --  21* 21* 20*  --  22 23  GLUCOSE 122*  --  116* 101* 98  --  171* 198*  BUN 50*  --  44* 34* 36*  --  31*  27*  CREATININE 3.28*  --  1.59* 0.95 0.82  --  0.92 0.97  CALCIUM 9.7  --  8.6* 8.8* 8.6*  --  8.6* 8.6*  MG 2.7*  --  2.5*  --  2.6*  --  2.3  --   PHOS  --  2.7  --   --  3.3  --  2.4*  --     Liver Function Tests: Recent Labs  Lab 12/28/18 0324 12/29/18 0601 12/30/18 0227 12/31/18 0507 01/01/19 0448  AST 419* 185* 111* 80* 68*  ALT 103* 81* 64* 51* 45*  ALKPHOS 222* 251* 227* 246* 310*  BILITOT 2.8* 3.4* 2.7* 1.7* 1.8*  PROT 6.9 6.2* 6.0* 5.7* 6.0*  ALBUMIN 2.9* 2.5* 2.4* 2.1* 2.2*   No results for input(s): LIPASE, AMYLASE in the last 168 hours. Recent Labs  Lab 12/01/2018 0940 12/28/18 0324 12/30/18 0954  AMMONIA 121* 44* 14    CBC: Recent Labs  Lab 12/13/2018 0940 12/28/18 0324 12/28/18 0956 12/29/18 0601 12/30/18 0227 12/31/18 0507 01/01/19 0448  WBC 17.5* 16.6*  --  14.8* 15.9* 14.0* 18.1*  NEUTROABS 14.5*  --   --   --   --  10.6* 14.5*  HGB 12.7 11.9* 12.6 12.3 10.6* 10.3* 10.8*  HCT 40.6 38.4  --  39.6 35.0* 33.7* 35.1*  MCV 75.6* 76.5*  --  77.0* 77.8* 77.6* 76.6*  PLT 395 311  --  259 237 214 198    Cardiac Enzymes: No results for input(s): CKTOTAL, CKMB, CKMBINDEX, TROPONINI in the last 168 hours.  Lipid Panel: No results for input(s): CHOL, TRIG, HDL, CHOLHDL, VLDL, LDLCALC in the last 168 hours.  CBG: Recent Labs  Lab 12/31/18 0736 12/31/18 2021 12/31/18 2314 01/01/19 0318 01/01/19 0721  GLUCAP 142* 156* 163* 185* 205*    Microbiology: Results for orders placed or performed during the hospital encounter of 12/13/2018  Blood culture (routine x 2)     Status: None   Collection Time: 12/23/2018  9:40 AM   Specimen: BLOOD  Result Value Ref Range Status   Specimen Description BLOOD LEFT ANTECUBITAL  Final   Special Requests   Final    BOTTLES DRAWN AEROBIC AND ANAEROBIC Blood Culture adequate volume   Culture   Final    NO GROWTH 5 DAYS Performed at [facility], [address] [place], [place] [address]    Report Status  01/01/2019 FINAL  Final  Blood culture (routine x 2)     Status: None   Collection Time: 12/21/2018  9:51 AM   Specimen: BLOOD LEFT HAND  Result Value Ref Range Status   Specimen Description BLOOD LEFT HAND  Final   Special Requests   Final    BOTTLES DRAWN AEROBIC AND ANAEROBIC Blood Culture results may not be optimal due to an inadequate volume of blood received in culture bottles   Culture   Final    NO GROWTH 5 DAYS Performed at [facility], [address] [place], [place] [address]    Report Status 01/01/2019 FINAL  Final  SARS Coronavirus 2 (CEPHEID - Performed in [facility] hospital lab), Hosp Order     Status: None   Collection Time: 12/01/2018 10:24 AM   Specimen: Nasopharyngeal Swab  Result Value Ref Range Status   SARS Coronavirus 2 NEGATIVE NEGATIVE Final    Comment: (NOTE) If result is NEGATIVE SARS-CoV-2 target nucleic acids are NOT DETECTED. The SARS-CoV-2 RNA is generally detectable in upper and lower  respiratory specimens during the acute phase of infection. The lowest  concentration of SARS-CoV-2 viral copies this assay can detect is 250  copies / mL. A negative result does not preclude SARS-CoV-2 infection  and should not be used as the sole basis for treatment or other  patient management decisions.  A negative result may occur with  improper specimen collection / handling, submission of specimen other  than nasopharyngeal swab, presence of viral mutation(s) within the  areas targeted by this assay, and inadequate number of viral copies  (<250 copies / mL). A negative result must be combined with clinical  observations, patient history, and epidemiological information. If result is POSITIVE SARS-CoV-2 target nucleic acids are DETECTED. The SARS-CoV-2 RNA is generally detectable in upper and lower  respiratory specimens dur ing the acute phase of infection.  Positive  results are indicative of active infection with SARS-CoV-2.  Clinical  correlation  with patient history and other diagnostic information is  necessary to determine patient infection status.  Positive results do  not rule out bacterial infection or co-infection with other viruses. If result is PRESUMPTIVE POSTIVE SARS-CoV-2 nucleic acids MAY BE PRESENT.   A presumptive positive result was obtained on the submitted specimen  and confirmed on repeat testing.  While 2019  novel coronavirus  (SARS-CoV-2) nucleic acids may be present in the submitted sample  additional confirmatory testing may be necessary for epidemiological  and / or clinical management purposes  to differentiate between  SARS-CoV-2 and other Sarbecovirus currently known to infect humans.  If clinically indicated additional testing with an alternate test  methodology [phone]) is advised. The SARS-CoV-2 RNA is generally  detectable in upper and lower respiratory sp ecimens during the acute  phase of infection. The expected result is Negative. Fact Sheet for Patients:  StrictlyIdeas.no Fact Sheet for Healthcare Providers: BankingDealers.co.za This test is not yet approved or cleared by the [place] FDA and has been authorized for detection and/or diagnosis of SARS-CoV-2 by FDA under an Emergency Use Authorization (EUA).  This EUA will remain in effect (meaning this test can be used) for the duration of the COVID-19 declaration under Section 564(b)(1) of the Act, 21 U.S.C. section 360bbb-3(b)(1), unless the authorization is terminated or revoked sooner. Performed at [facility], [address] [place], [place] [address]   MRSA PCR Screening     Status: None   Collection Time: 12/09/2018  3:29 PM   Specimen: Nasal Mucosa; Nasopharyngeal  Result Value Ref Range Status   MRSA by PCR NEGATIVE NEGATIVE Final    Comment:        The GeneXpert MRSA Assay (FDA approved for NASAL specimens only), is one component of a comprehensive MRSA  colonization surveillance program. It is not intended to diagnose MRSA infection nor to guide or monitor treatment for MRSA infections. Performed at [facility], [address], [place], [place] [address]   Urine culture     Status: None   Collection Time: 12/02/2018  3:40 PM   Specimen: Urine, Random  Result Value Ref Range Status   Specimen Description   Final    URINE, RANDOM Performed at [facility], [address] [place], [place] [address]    Special Requests   Final    NONE Performed at [facility], [address] [place], [place] [address]    Culture   Final    NO GROWTH Performed at [facility], [place] [address], [place], [place] [address]    Report Status 12/28/2018 FINAL  Final  Gastrointestinal Panel by PCR , Stool     Status: None   Collection Time: 12/29/18  8:51 PM   Specimen: Stool  Result Value Ref Range Status   Campylobacter species NOT DETECTED NOT DETECTED Final   Plesimonas shigelloides NOT DETECTED NOT DETECTED Final   Salmonella species NOT DETECTED NOT DETECTED Final   Yersinia enterocolitica NOT DETECTED NOT DETECTED Final   Vibrio species NOT DETECTED NOT DETECTED Final   Vibrio cholerae NOT DETECTED NOT DETECTED Final   Enteroaggregative E coli (EAEC) NOT DETECTED NOT DETECTED Final   Enteropathogenic E coli (EPEC) NOT DETECTED NOT DETECTED Final   Enterotoxigenic E coli (ETEC) NOT DETECTED NOT DETECTED Final   Shiga like toxin producing E coli (STEC) NOT DETECTED NOT DETECTED Final   Shigella/Enteroinvasive E coli (EIEC) NOT DETECTED NOT DETECTED Final   Cryptosporidium NOT DETECTED NOT DETECTED Final   Cyclospora cayetanensis NOT DETECTED NOT DETECTED Final   Entamoeba histolytica NOT DETECTED NOT DETECTED Final   Giardia lamblia NOT DETECTED NOT DETECTED Final   Adenovirus F40/41 NOT DETECTED NOT DETECTED Final   Astrovirus NOT DETECTED NOT DETECTED Final   Norovirus GI/GII NOT DETECTED NOT DETECTED  Final   Rotavirus A NOT DETECTED NOT  DETECTED Final   Sapovirus (I, II, IV, and V) NOT DETECTED NOT DETECTED Final    Comment: Performed at [facility], [address] [place], [place] [address]  C difficile quick scan w PCR reflex     Status: None   Collection Time: 12/29/18  8:51 PM   Specimen: STOOL  Result Value Ref Range Status   C Diff antigen NEGATIVE NEGATIVE Final   C Diff toxin NEGATIVE NEGATIVE Final   C Diff interpretation No C. difficile detected.  Final    Comment: Performed at [facility], [address] [place],  [address]    Coagulation Studies: No results for input(s): LABPROT, INR in the last 72 hours.  Imaging: Mr Brain Wo Contrast  Result Date: 12/31/2018 CLINICAL DATA:  73 year old female with altered mental status, suspected sepsis. Liver tumor. EXAM: MRI HEAD WITHOUT CONTRAST TECHNIQUE: Multiplanar, multiecho pulse sequences of the brain and surrounding structures were obtained without intravenous contrast. COMPARISON:  Head CT 12/07/2018 and earlier. FINDINGS: Study is intermittently degraded by motion artifact despite repeated imaging attempts. Brain: No restricted diffusion to suggest acute infarction. No midline shift, mass effect, evidence of mass lesion, ventriculomegaly, extra-axial collection or acute intracranial hemorrhage. Cervicomedullary junction and pituitary are within normal limits. Scattered bilateral cerebral white matter T2 and FLAIR hyperintensity, mild to moderate for age. No cortical encephalomalacia or chronic cerebral blood products identified. No intrinsic increased T1 signal in the deep gray matter nuclei. The basal ganglia, thalami, brainstem, and cerebellum appear normal for age. Vascular: Major intracranial vascular flow voids are preserved. Skull and upper cervical spine: Negative visible cervical spine. Visible bone marrow signal appears within normal limits. Sinuses/Orbits: Negative orbits. Paranasal sinuses and  mastoids are stable and well pneumatized. Other: Scalp and face soft tissues appear negative. IMPRESSION: 1.  No acute intracranial abnormality. 2. Mild to moderate for age cerebral white matter signal changes, most commonly due to chronic small vessel disease. Electronically Signed   By: Genevie Ann M.D.   On: 12/31/2018 16:09   Mr 3d Recon At Scanner  Result Date: 12/31/2018 CLINICAL DATA:  Inpatient. 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 Recent initiation of chemotherapy. Patient admitted with abnormal liver function tests, sepsis, altered mental status. EXAM: MRI ABDOMEN WITHOUT AND WITH CONTRAST (INCLUDING MRCP) TECHNIQUE: Multiplanar multisequence MR imaging of the abdomen was performed both before and after the administration of intravenous contrast. Heavily T2-weighted images of the biliary and pancreatic ducts were obtained, and three-dimensional MRCP images were rendered by post processing. CONTRAST:  7 cc Gadavist IV. COMPARISON:  11/11/2018 MRI abdomen. FINDINGS: Lower chest: Small dependent bilateral pleural effusions, right greater than left, new from prior MRI. Passive atelectasis at the dependent lung bases bilaterally. Hepatobiliary: There are multiple heterogeneously enhancing poorly defined masses scattered throughout the right greater than left liver lobes, most of which are new since 11/11/2018 MRI. For example, right liver dome 2.5 x 2.0 cm mass (series 19/image 18), segment 7 right liver lobe 2.7 x 2.2 cm mass (series 19/image 24), peripheral right liver lobe 3.6 x 2.0 cm mass (series 19/image 28) and segment 2 left liver lobe 1.7 x 1.1 cm mass (series 19/image 29) are all new. Segment 8 right liver lobe 2.4 x 2.1 cm mass (series 19/image 22), previously 2.4 x 2.4 cm, not appreciably changed. 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 Cholecystectomy. No biliary ductal dilatation. Common bile duct diameter 4 mm. No evidence of choledocholithiasis. Pancreas: Mild diffuse peripancreatic edema. No pancreatic mass or duct dilation. Spleen: Normal size. No mass. Adrenals/Urinary Tract: Normal adrenals. No hydronephrosis. Normal kidneys with no renal mass. Stomach/Bowel: Normal non-distended stomach. Normal caliber visualized bowel loops. Generalized wall thickening in visualized small and large bowel. Vascular/Lymphatic: Nonaneurysmal abdominal aorta. Patent hepatic, splenic and renal veins. Patent SMV. No pathologically enlarged lymph nodes in the abdomen. Other: Small volume perihepatic ascites.  No focal fluid collection. Musculoskeletal: No aggressive appearing focal osseous lesions. IMPRESSION: 1. Several heterogeneously enhancing poorly defined liver masses scattered throughout the right greater than left liver lobes, most of which are new since 11/11/2018 MRI. Findings are compatible with progression of multifocal hepatocellular carcinoma. Previously visualized solitary segment 8 right liver mass is stable. 2. 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 3. Small volume perihepatic ascites. 4. Generalized bowel wall thickening and mild peripancreatic edema, nonspecific, potentially due to a portal hypertension and/or hypoproteinemia. 5. Small dependent bilateral pleural effusions, right greater than left, new. Electronically Signed   By: Ilona Sorrel M.D.   On: 12/31/2018 19:46   Mr Abdomen Mrcp Moise Boring Contast  Result Date: 12/31/2018 CLINICAL DATA:  Inpatient. 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 Recent initiation of chemotherapy. Patient admitted with abnormal liver function tests, sepsis, altered mental status. EXAM: MRI ABDOMEN WITHOUT AND WITH CONTRAST  (INCLUDING MRCP) TECHNIQUE: Multiplanar multisequence MR imaging of the abdomen was performed both before and after the administration of intravenous contrast. Heavily T2-weighted images of the biliary and pancreatic ducts were obtained, and three-dimensional MRCP images were rendered by post processing. CONTRAST:  7 cc Gadavist IV. COMPARISON:  11/11/2018 MRI abdomen. FINDINGS: Lower chest: Small dependent bilateral pleural effusions, right greater than left, new from prior MRI. Passive atelectasis at the dependent lung bases bilaterally. Hepatobiliary: There are multiple heterogeneously enhancing poorly defined masses scattered throughout the right greater than left liver lobes, most of which are new since 11/11/2018 MRI. For example, right liver dome 2.5 x 2.0 cm mass (series 19/image 18), segment 7 right liver lobe 2.7 x 2.2 cm mass (series 19/image 24), peripheral right liver lobe 3.6 x 2.0 cm mass (series 19/image 28) and segment 2 left liver lobe 1.7 x 1.1 cm mass (series 19/image 29) are all new. Segment 8 right liver lobe 2.4 x 2.1 cm mass (series 19/image 22), previously 2.4 x 2.4 cm, not appreciably changed. 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 Cholecystectomy. No biliary ductal dilatation. Common bile duct diameter 4 mm. No evidence of choledocholithiasis. Pancreas: Mild diffuse peripancreatic edema. No pancreatic mass or duct dilation. Spleen: Normal size. No mass. Adrenals/Urinary Tract: Normal adrenals. No hydronephrosis. Normal kidneys with no renal mass. Stomach/Bowel: Normal non-distended stomach. Normal caliber visualized bowel loops. Generalized wall thickening in visualized small and large bowel. Vascular/Lymphatic: Nonaneurysmal abdominal aorta. Patent hepatic, splenic and renal veins. Patent SMV. No pathologically enlarged lymph nodes in the  abdomen. Other: Small volume perihepatic ascites.  No focal fluid collection. Musculoskeletal: No aggressive appearing focal osseous lesions. IMPRESSION: 1. Several heterogeneously enhancing poorly defined liver masses scattered throughout the right greater than left liver lobes, most of which are new since 11/11/2018 MRI. Findings are compatible with progression of multifocal hepatocellular  carcinoma. Previously visualized solitary segment 8 right liver mass is stable. 2. 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 3. Small volume perihepatic ascites. 4. Generalized bowel wall thickening and mild peripancreatic edema, nonspecific, potentially due to a portal hypertension and/or hypoproteinemia. 5. Small dependent bilateral pleural effusions, right greater than left, new. Electronically Signed   By: Ilona Sorrel M.D.   On: 12/31/2018 19:46    Medications:  I have reviewed the patient's current medications. Scheduled: . [id number]  1 patch Transdermal Q72H  . methylPREDNISolone (SOLU-MEDROL) injection  50 mg Intravenous Q8H  . pantoprazole (PROTONIX) IV  40 mg Intravenous Q12H  . sodium chloride flush  10-40 mL Intracatheter Q12H    Assessment/Plan: Patient remains altered.  On Precedex.  MRI of the brain reviewed and shows no acute changes.  EEG only significant for diffuse slowing.  Will not perform LP at this time due to patient recently receiving Avastin.  Case discussed at length with ID and Oncology.  Doubt herpes encephalitis but will continue antibiotics and steroids have been initiated for possibility of immune mediated encephalitis.   Will continue to follow with you.     LOS: 5 days   Alexis Goodell, MD Neurology [phone] 01/01/2019  9:50 AM

## 2019-01-01 NOTE — Progress Notes (Signed)
CRITICAL CARE NOTE      CHIEF COMPLAINT:   Altered mental status with lethargy in context of new diagnosis of hepatocellular carcinoma   SUBJECTIVE FINDINGS AND OVERNIGHT EVENTS    73 F w/hx of newly diagnosed Spring with extensive portal venous thrombosis s/p immunotherapy came in with lethargy and AKI, hyperammonemia/hepatic encephalopathy, possible TLS with uremic encephalopathy, possible sepsis and encephalopathy secondary to chemotherapy.    S/ p evaluation by neuro and ID - appreciate input - less likely infectious.   Concern with LP due to Avastin therapy  Discussed this am with oncology Dr Mike Gip she has initiated [facility] transfer to help with management of possible imunotherapy related encephalopathy    PAST MEDICAL HISTORY   Past Medical History:  Diagnosis Date   Hypertension    not currently taking meds     SURGICAL HISTORY   Past Surgical History:  Procedure Laterality Date   BREAST BIOPSY Left    neg   CHOLECYSTECTOMY  1992     FAMILY HISTORY   Family History  Problem Relation Age of Onset   Alzheimer's disease Mother    Stroke Father    Breast cancer Daughter 18       [place], genetic test negative     SOCIAL HISTORY   Social History   Tobacco Use   Smoking status: Never Smoker   Smokeless tobacco: Never Used  Substance Use Topics   Alcohol use: No   Drug use: No     MEDICATIONS   Current Medication:  Current Facility-Administered Medications:    dexmedetomidine (PRECEDEX) 400 MCG/100ML (4 mcg/mL) infusion, 0.4-1.2 mcg/kg/hr, Intravenous, Titrated, [name] D, NP, Last Rate: 22.5 mL/hr at 01/01/19 0343, 1.2 mcg/kg/hr at 01/01/19 0343   dextrose 5 % in lactated ringers infusion, , Intravenous, Continuous, Blakeney, Dreama Saa, NP, Last Rate: 50 mL/hr at  01/01/19 0535   fentaNYL (DURAGESIC) 25 MCG/HR 1 patch, 1 patch, Transdermal, Q72H, Awilda Bill, NP, 1 patch at 12/31/18 1208   fentaNYL (SUBLIMAZE) injection 12.5 mcg, 12.5 mcg, Intravenous, Q2H PRN, [name] D, NP, 12.5 mcg at 01/01/19 0340   methylPREDNISolone sodium succinate (SOLU-MEDROL) 125 mg/2 mL injection 50 mg, 50 mg, Intravenous, Q8H, Blakeney, Dana G, NP, 50 mg at 01/01/19 0519   ondansetron (ZOFRAN) tablet 4 mg, 4 mg, Oral, Q6H PRN **OR** ondansetron (ZOFRAN) injection 4 mg, 4 mg, Intravenous, Q6H PRN, Pyreddy, Pavan, MD   pantoprazole (PROTONIX) injection 40 mg, 40 mg, Intravenous, Q12H, Blakeney, Dreama Saa, NP, 40 mg at 12/31/18 2135   piperacillin-tazobactam (ZOSYN) IVPB 3.375 g, 3.375 g, Intravenous, Q8H, Blakeney, Dreama Saa, NP, Last Rate: 12.5 mL/hr at 01/01/19 0522, 3.375 g at 01/01/19 0522   simethicone (MYLICON) chewable tablet 80 mg, 80 mg, Oral, Q6H PRN, Awilda Bill, NP   sodium chloride flush (NS) 0.9 % injection 10-40 mL, 10-40 mL, Intracatheter, Q12H, Sudini, Srikar, MD, 10 mL at 12/31/18 2134   sodium chloride flush (NS) 0.9 % injection 10-40 mL, 10-40 mL, Intracatheter, PRN, Sudini, Srikar, MD    ALLERGIES   Lisinopril    REVIEW OF SYSTEMS     Unable to obtain due to obtunded state and lethargy  PHYSICAL EXAMINATION   Vitals:   01/01/19 0500 01/01/19 0530  BP: (!) 160/84 (!) 161/84  Pulse: 69 69  Resp: 13 14  Temp:    SpO2: 99% 99%    GENERAL: Age-appropriate patient with altered mental status HEAD: Normocephalic, atraumatic.  EYES: Pupils equal, round, reactive to light.  No scleral icterus.  MOUTH: Moist mucosal membrane. NECK: Supple. No thyromegaly. No nodules. No JVD.  PULMONARY: Mild bibasilar crackles CARDIOVASCULAR: S1 and S2. Regular rate and rhythm. No murmurs, rubs, or gallops.  GASTROINTESTINAL: Soft, nontender, non-distended. No masses. Positive bowel sounds. No hepatosplenomegaly.  MUSCULOSKELETAL: No  swelling, clubbing, or edema.  NEUROLOGIC: Mild distress due to acute illness SKIN:intact,warm,dry   LABS AND IMAGING     LAB RESULTS: Recent Labs  Lab 12/30/18 0227 12/30/18 1555 12/31/18 0507 01/01/19 0448  NA 144  --  144 147*  K 3.0* 4.0 3.8 4.4  CL 111  --  115* 115*  CO2 20*  --  22 23  BUN 36*  --  31* 27*  CREATININE 0.82  --  0.92 0.97  GLUCOSE 98  --  171* 198*   Recent Labs  Lab 12/30/18 0227 12/31/18 0507 01/01/19 0448  HGB 10.6* 10.3* 10.8*  HCT 35.0* 33.7* 35.1*  WBC 15.9* 14.0* 18.1*  PLT 237 214 198     IMAGING RESULTS: Mr Brain Wo Contrast  Result Date: 12/31/2018 CLINICAL DATA:  73 year old female with altered mental status, suspected sepsis. Liver tumor. EXAM: MRI HEAD WITHOUT CONTRAST TECHNIQUE: Multiplanar, multiecho pulse sequences of the brain and surrounding structures were obtained without intravenous contrast. COMPARISON:  Head CT 12/07/2018 and earlier. FINDINGS: Study is intermittently degraded by motion artifact despite repeated imaging attempts. Brain: No restricted diffusion to suggest acute infarction. No midline shift, mass effect, evidence of mass lesion, ventriculomegaly, extra-axial collection or acute intracranial hemorrhage. Cervicomedullary junction and pituitary are within normal limits. Scattered bilateral cerebral white matter T2 and FLAIR hyperintensity, mild to moderate for age. No cortical encephalomalacia or chronic cerebral blood products identified. No intrinsic increased T1 signal in the deep gray matter nuclei. The basal ganglia, thalami, brainstem, and cerebellum appear normal for age. Vascular: Major intracranial vascular flow voids are preserved. Skull and upper cervical spine: Negative visible cervical spine. Visible bone marrow signal appears within normal limits. Sinuses/Orbits: Negative orbits. Paranasal sinuses and mastoids are stable and well pneumatized. Other: Scalp and face soft tissues appear negative. IMPRESSION: 1.   No acute intracranial abnormality. 2. Mild to moderate for age cerebral white matter signal changes, most commonly due to chronic small vessel disease. Electronically Signed   By: Genevie Ann M.D.   On: 12/31/2018 16:09   Mr 3d Recon At Scanner  Result Date: 12/31/2018 CLINICAL DATA:  Inpatient. 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 Recent initiation of chemotherapy. Patient admitted with abnormal liver function tests, sepsis, altered mental status. EXAM: MRI ABDOMEN WITHOUT AND WITH CONTRAST (INCLUDING MRCP) TECHNIQUE: Multiplanar multisequence MR imaging of the abdomen was performed both before and after the administration of intravenous contrast. Heavily T2-weighted images of the biliary and pancreatic ducts were obtained, and three-dimensional MRCP images were rendered by post processing. CONTRAST:  7 cc Gadavist IV. COMPARISON:  11/11/2018 MRI abdomen. FINDINGS: Lower chest: Small dependent bilateral pleural effusions, right greater than left, new from prior MRI. Passive atelectasis at the dependent lung bases bilaterally. Hepatobiliary: There are multiple heterogeneously enhancing poorly defined masses scattered throughout the right greater than left liver lobes, most of which are new since 11/11/2018 MRI. For example, right liver dome 2.5 x 2.0 cm mass (series 19/image 18), segment 7 right liver lobe 2.7 x 2.2 cm mass (series 19/image 24), peripheral right liver lobe 3.6 x 2.0 cm mass (series 19/image 28) and segment 2 left liver lobe 1.7 x 1.1 cm mass (  series 19/image 29) are all new. Segment 8 right liver lobe 2.4 x 2.1 cm mass (series 19/image 22), previously 2.4 x 2.4 cm, not appreciably changed. 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 Cholecystectomy. No biliary ductal dilatation. Common bile duct  diameter 4 mm. No evidence of choledocholithiasis. Pancreas: Mild diffuse peripancreatic edema. No pancreatic mass or duct dilation. Spleen: Normal size. No mass. Adrenals/Urinary Tract: Normal adrenals. No hydronephrosis. Normal kidneys with no renal mass. Stomach/Bowel: Normal non-distended stomach. Normal caliber visualized bowel loops. Generalized wall thickening in visualized small and large bowel. Vascular/Lymphatic: Nonaneurysmal abdominal aorta. Patent hepatic, splenic and renal veins. Patent SMV. No pathologically enlarged lymph nodes in the abdomen. Other: Small volume perihepatic ascites.  No focal fluid collection. Musculoskeletal: No aggressive appearing focal osseous lesions. IMPRESSION: 1. Several heterogeneously enhancing poorly defined liver masses scattered throughout the right greater than left liver lobes, most of which are new since 11/11/2018 MRI. Findings are compatible with progression of multifocal hepatocellular carcinoma. Previously visualized solitary segment 8 right liver mass is stable. 2. 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 3. Small volume perihepatic ascites. 4. Generalized bowel wall thickening and mild peripancreatic edema, nonspecific, potentially due to a portal hypertension and/or hypoproteinemia. 5. Small dependent bilateral pleural effusions, right greater than left, new. Electronically Signed   By: Ilona Sorrel M.D.   On: 12/31/2018 19:46   Mr Abdomen Mrcp Moise Boring Contast  Result Date: 12/31/2018 CLINICAL DATA:  Inpatient. 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 Recent initiation of chemotherapy. Patient admitted with abnormal liver function tests, sepsis, altered mental status. EXAM: MRI ABDOMEN WITHOUT AND WITH CONTRAST (INCLUDING MRCP) TECHNIQUE: Multiplanar multisequence MR imaging of the abdomen was performed both before and after the  administration of intravenous contrast. Heavily T2-weighted images of the biliary and pancreatic ducts were obtained, and three-dimensional MRCP images were rendered by post processing. CONTRAST:  7 cc Gadavist IV. COMPARISON:  11/11/2018 MRI abdomen. FINDINGS: Lower chest: Small dependent bilateral pleural effusions, right greater than left, new from prior MRI. Passive atelectasis at the dependent lung bases bilaterally. Hepatobiliary: There are multiple heterogeneously enhancing poorly defined masses scattered throughout the right greater than left liver lobes, most of which are new since 11/11/2018 MRI. For example, right liver dome 2.5 x 2.0 cm mass (series 19/image 18), segment 7 right liver lobe 2.7 x 2.2 cm mass (series 19/image 24), peripheral right liver lobe 3.6 x 2.0 cm mass (series 19/image 28) and segment 2 left liver lobe 1.7 x 1.1 cm mass (series 19/image 29) are all new. Segment 8 right liver lobe 2.4 x 2.1 cm mass (series 19/image 22), previously 2.4 x 2.4 cm, not appreciably changed. 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 Cholecystectomy. No biliary ductal dilatation. Common bile duct diameter 4 mm. No evidence of choledocholithiasis. Pancreas: Mild diffuse peripancreatic edema. No pancreatic mass or duct dilation. Spleen: Normal size. No mass. Adrenals/Urinary Tract: Normal adrenals. No hydronephrosis. Normal kidneys with no renal mass. Stomach/Bowel: Normal non-distended stomach. Normal caliber visualized bowel loops. Generalized wall thickening in visualized small and large bowel. Vascular/Lymphatic: Nonaneurysmal abdominal aorta. Patent hepatic, splenic and renal veins. Patent SMV. No pathologically enlarged lymph nodes in  the abdomen. Other: Small volume perihepatic ascites.  No focal fluid collection. Musculoskeletal: No aggressive appearing focal  osseous lesions. IMPRESSION: 1. Several heterogeneously enhancing poorly defined liver masses scattered throughout the right greater than left liver lobes, most of which are new since 11/11/2018 MRI. Findings are compatible with progression of multifocal hepatocellular carcinoma. Previously visualized solitary segment 8 right liver mass is stable. 2. 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 3. Small volume perihepatic ascites. 4. Generalized bowel wall thickening and mild peripancreatic edema, nonspecific, potentially due to a portal hypertension and/or hypoproteinemia. 5. Small dependent bilateral pleural effusions, right greater than left, new. Electronically Signed   By: Ilona Sorrel M.D.   On: 12/31/2018 19:46      ASSESSMENT AND PLAN    -Multidisciplinary rounds held today  Altered mental status with lethargy  - poss septic in etiology vs toxic metabolic,  Uremia and ammonia has been evaluated and treated with some improvement evident per children but not to baseline.  -plan for transfer to [facility] - s/p MRI - wnl,  S/p ID and neuro eval with EEG showing -encephalitis - lactulose stopped  - fluid resuscitation and empiric IV antibiotics - on immunotherapy with Avastin and Tecentriq which can predispose to infection and extensive cytolysis for bulky tumors including HCC vs immunotherapy related encephalitis -Procalcitonin elevated indicative of possible infection   Possible TLS   -evidenced by significant hyperuricemia and AKI stage III with lethargy post initiation of chemo but electrolytes not meeting criteria for TLS  - will obtain G6PD level and initiate Rasburicase if necessary   Hepatocellular carcinoma with portal vein throbosis - s/p Liver Bx - newly diagnosed -Followed by Dr. Tasia Catchings oncology-appreciate input -Status post treatment with Tecentriq and Avastin -Hep C antibody positive RNA in  progress    Acute Kidney Injury - KDIGO stage 3  - noted UA  - d/c non-essential nephrotoxic medications  - suspect partially due to transient hypotension  - renal US   - urine electrolytes    Possible sepsis -Blood culture x2 negative  -will consider LP- discussed with neuro and oncology - possible contraidicated with avastin -on immunotherapy predisposing to infection 1 -Procalcitonin elevated >4, trending -use vasopressors to keep MAP>65 -follow ABG and LA -follow up cultures-no growth x2 days -emperic ABX- cefepime, vancomycin, rifaximin -consider stress dose steroids -noted elevated lactate, trending    Transaminitis -Possibly due to transient hypotension, hepatic congestion in lieu of extensive thormbosis of portal venous circulation, HCC itself, and with component possibly related to recent immunotherapy  -DC nonessential essential hepatotoxic medications and IV resuscitation -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, 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 with assumed cure -GI on case-appreciate input   GI/Nutrition GI PROPHYLAXIS as indicated DIET-->TF's as tolerated Constipation protocol as indicated   ENDO - ICU hypoglycemic\Hyperglycemia protocol -check FSBS per protocol   ELECTROLYTES -follow labs as needed -replace as needed -pharmacy consultation   DVT/GI PRX ordered -SCDs  TRANSFUSIONS AS NEEDED MONITOR FSBS ASSESS the need for LABS as needed   Critical care provider statement:    Critical care time (minutes):  34   Critical care time was exclusive of:  Separately billable procedures and treating other patients   Critical care was necessary to treat or prevent imminent or life-threatening deterioration of the following conditions:   Acute mental status with lethargy, transaminitis, newly diagnosed hepatocellular carcinoma, extensive venous portal thrombosis, acute kidney injury stage III, probable  sepsis, multiple comorbid conditions   Critical care was  time spent personally by me on the following activities:  Development of treatment plan with patient or surrogate, discussions with consultants, evaluation of patient's response to treatment, examination of patient, obtaining history from patient or surrogate, ordering and performing treatments and interventions, ordering and review of laboratory studies and re-evaluation of patient's condition.  I assumed direction of critical care for this patient from another provider in my specialty: no    This document was prepared using Dragon voice recognition software and may include unintentional dictation errors.    Ottie Glazier, M.D.  Division of [place]

## 2019-01-01 NOTE — Progress Notes (Signed)
[facility] NAME: Abigail Ayers    MR#:  [id number]  DATE OF BIRTH:  [birth year]  SUBJECTIVE:  CHIEF COMPLAINT:   Chief Complaint  Patient presents with  . Altered Mental Status   Still confused. Sitter at bedside  REVIEW OF SYSTEMS:    Review of Systems  Unable to perform ROS: Mental status change    DRUG ALLERGIES:   Allergies  Allergen Reactions  . Lisinopril Other (See Comments)    Other reaction(s): Cough    VITALS:  Blood pressure (!) 165/85, pulse 67, temperature 98.9 F (37.2 C), temperature source Oral, resp. rate 12, height 5' 0.5" (1.537 m), weight 74.9 kg, SpO2 100 %.  PHYSICAL EXAMINATION:   Physical Exam  GENERAL:  73 y.o.-year-old patient lying in the bed with no acute distress.  EYES: Pupils equal, round, reactive to light and accommodation. No scleral icterus. HEENT: Head atraumatic, normocephalic. Oropharynx and nasopharynx clear.  NECK:  Supple, no jugular venous distention. No thyroid enlargement, no tenderness.  LUNGS: Normal breath sounds bilaterally, no wheezing, rales, rhonchi. No use of accessory muscles of respiration.  CARDIOVASCULAR: S1, S2 normal. No murmurs, rubs, or gallops.  ABDOMEN: Soft, nontender, nondistended. Bowel sounds present.  EXTREMITIES: No cyanosis, clubbing or edema b/l.    NEUROLOGIC: Not following instructions  PSYCHIATRIC: The patient is drowzy SKIN: No obvious rash, lesion, or ulcer.   LABORATORY PANEL:   CBC Recent Labs  Lab 01/01/19 0448  WBC 18.1*  HGB 10.8*  HCT 35.1*  PLT 198   ------------------------------------------------------------------------------------------------------------------ Chemistries  Recent Labs  Lab 12/31/18 0507 01/01/19 0448  NA 144 147*  K 3.8 4.4  CL 115* 115*  CO2 22 23  GLUCOSE 171* 198*  BUN 31* 27*  CREATININE 0.92 0.97  CALCIUM 8.6* 8.6*  MG 2.3  --   AST 80* 68*  ALT 51* 45*  ALKPHOS 246* 310*  BILITOT  1.7* 1.8*   ------------------------------------------------------------------------------------------------------------------  Cardiac Enzymes No results for input(s): TROPONINI in the last 168 hours. ------------------------------------------------------------------------------------------------------------------  RADIOLOGY:  Mr Brain 58 Contrast  Result Date: 12/31/2018 CLINICAL DATA:  73 year old female with altered mental status, suspected sepsis. Liver tumor. EXAM: MRI HEAD WITHOUT CONTRAST TECHNIQUE: Multiplanar, multiecho pulse sequences of the brain and surrounding structures were obtained without intravenous contrast. COMPARISON:  Head CT 12/07/2018 and earlier. FINDINGS: Study is intermittently degraded by motion artifact despite repeated imaging attempts. Brain: No restricted diffusion to suggest acute infarction. No midline shift, mass effect, evidence of mass lesion, ventriculomegaly, extra-axial collection or acute intracranial hemorrhage. Cervicomedullary junction and pituitary are within normal limits. Scattered bilateral cerebral white matter T2 and FLAIR hyperintensity, mild to moderate for age. No cortical encephalomalacia or chronic cerebral blood products identified. No intrinsic increased T1 signal in the deep gray matter nuclei. The basal ganglia, thalami, brainstem, and cerebellum appear normal for age. Vascular: Major intracranial vascular flow voids are preserved. Skull and upper cervical spine: Negative visible cervical spine. Visible bone marrow signal appears within normal limits. Sinuses/Orbits: Negative orbits. Paranasal sinuses and mastoids are stable and well pneumatized. Other: Scalp and face soft tissues appear negative. IMPRESSION: 1.  No acute intracranial abnormality. 2. Mild to moderate for age cerebral white matter signal changes, most commonly due to chronic small vessel disease. Electronically Signed   By: Genevie Ann M.D.   On: 12/31/2018 16:09   Mr 3d Recon At  Scanner  Result Date: 12/31/2018 CLINICAL DATA:  Inpatient. Moderately differentiated hepatocellular carcinoma diagnosed  on 12/06/2018 percutaneous liver biopsy. Recent initiation of chemotherapy. Patient admitted with abnormal liver function tests, sepsis, altered mental status. EXAM: MRI ABDOMEN WITHOUT AND WITH CONTRAST (INCLUDING MRCP) TECHNIQUE: Multiplanar multisequence MR imaging of the abdomen was performed both before and after the administration of intravenous contrast. Heavily T2-weighted images of the biliary and pancreatic ducts were obtained, and three-dimensional MRCP images were rendered by post processing. CONTRAST:  7 cc Gadavist IV. COMPARISON:  11/11/2018 MRI abdomen. FINDINGS: Lower chest: Small dependent bilateral pleural effusions, right greater than left, new from prior MRI. Passive atelectasis at the dependent lung bases bilaterally. Hepatobiliary: There are multiple heterogeneously enhancing poorly defined masses scattered throughout the right greater than left liver lobes, most of which are new since 11/11/2018 MRI. For example, right liver dome 2.5 x 2.0 cm mass (series 19/image 18), segment 7 right liver lobe 2.7 x 2.2 cm mass (series 19/image 24), peripheral right liver lobe 3.6 x 2.0 cm mass (series 19/image 28) and segment 2 left liver lobe 1.7 x 1.1 cm mass (series 19/image 29) are all new. Segment 8 right liver lobe 2.4 x 2.1 cm mass (series 19/image 22), previously 2.4 x 2.4 cm, not appreciably changed. 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 Cholecystectomy. No biliary ductal dilatation. Common bile duct diameter 4 mm. No evidence of choledocholithiasis. Pancreas: Mild diffuse peripancreatic edema. No pancreatic mass or duct dilation. Spleen: Normal size. No mass. Adrenals/Urinary Tract: Normal adrenals. No hydronephrosis. Normal  kidneys with no renal mass. Stomach/Bowel: Normal non-distended stomach. Normal caliber visualized bowel loops. Generalized wall thickening in visualized small and large bowel. Vascular/Lymphatic: Nonaneurysmal abdominal aorta. Patent hepatic, splenic and renal veins. Patent SMV. No pathologically enlarged lymph nodes in the abdomen. Other: Small volume perihepatic ascites.  No focal fluid collection. Musculoskeletal: No aggressive appearing focal osseous lesions. IMPRESSION: 1. Several heterogeneously enhancing poorly defined liver masses scattered throughout the right greater than left liver lobes, most of which are new since 11/11/2018 MRI. Findings are compatible with progression of multifocal hepatocellular carcinoma. Previously visualized solitary segment 8 right liver mass is stable. 2. 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 3. Small volume perihepatic ascites. 4. Generalized bowel wall thickening and mild peripancreatic edema, nonspecific, potentially due to a portal hypertension and/or hypoproteinemia. 5. Small dependent bilateral pleural effusions, right greater than left, new. Electronically Signed   By: Ilona Sorrel M.D.   On: 12/31/2018 19:46   Mr Abdomen Mrcp Moise Boring Contast  Result Date: 12/31/2018 CLINICAL DATA:  Inpatient. 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 Recent initiation of chemotherapy. Patient admitted with abnormal liver function tests, sepsis, altered mental status. EXAM: MRI ABDOMEN WITHOUT AND WITH CONTRAST (INCLUDING MRCP) TECHNIQUE: Multiplanar multisequence MR imaging of the abdomen was performed both before and after the administration of intravenous contrast. Heavily T2-weighted images of the biliary and pancreatic ducts were obtained, and three-dimensional MRCP images were rendered by post processing. CONTRAST:  7 cc Gadavist IV. COMPARISON:   11/11/2018 MRI abdomen. FINDINGS: Lower chest: Small dependent bilateral pleural effusions, right greater than left, new from prior MRI. Passive atelectasis at the dependent lung bases bilaterally. Hepatobiliary: There are multiple heterogeneously enhancing poorly defined masses scattered throughout the right greater than left liver lobes, most of which are new since 11/11/2018 MRI. For example, right liver dome  2.5 x 2.0 cm mass (series 19/image 18), segment 7 right liver lobe 2.7 x 2.2 cm mass (series 19/image 24), peripheral right liver lobe 3.6 x 2.0 cm mass (series 19/image 28) and segment 2 left liver lobe 1.7 x 1.1 cm mass (series 19/image 29) are all new. Segment 8 right liver lobe 2.4 x 2.1 cm mass (series 19/image 22), previously 2.4 x 2.4 cm, not appreciably changed. 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 Cholecystectomy. No biliary ductal dilatation. Common bile duct diameter 4 mm. No evidence of choledocholithiasis. Pancreas: Mild diffuse peripancreatic edema. No pancreatic mass or duct dilation. Spleen: Normal size. No mass. Adrenals/Urinary Tract: Normal adrenals. No hydronephrosis. Normal kidneys with no renal mass. Stomach/Bowel: Normal non-distended stomach. Normal caliber visualized bowel loops. Generalized wall thickening in visualized small and large bowel. Vascular/Lymphatic: Nonaneurysmal abdominal aorta. Patent hepatic, splenic and renal veins. Patent SMV. No pathologically enlarged lymph nodes in the abdomen. Other: Small volume perihepatic ascites.  No focal fluid collection. Musculoskeletal: No aggressive appearing focal osseous lesions. IMPRESSION: 1. Several heterogeneously enhancing poorly defined liver masses scattered throughout the right greater than left liver lobes, most of which are new since 11/11/2018 MRI. Findings are compatible  with progression of multifocal hepatocellular carcinoma. Previously visualized solitary segment 8 right liver mass is stable. 2. 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 3. Small volume perihepatic ascites. 4. Generalized bowel wall thickening and mild peripancreatic edema, nonspecific, potentially due to a portal hypertension and/or hypoproteinemia. 5. Small dependent bilateral pleural effusions, right greater than left, new. Electronically Signed   By: Ilona Sorrel M.D.   On: 12/31/2018 19:46     ASSESSMENT AND PLAN:   73 year old female with a known history of hypertension, hepatocellular carcinoma presented to the emergency room for confusion  Patient is altered.   -Acute encephalopathy Etiology unclear. Neurology consulted Encephalitis?  -Sepsis Unknown etiology Follow-up cultures and lactic acid level IV fluids Broad-spectrum antibiotics Cultures no growth to date LP being considered by ICU team  -Hepatocellular carcinoma Oncology follow-up Poor prognosis  -Dehydration IV fluids  -Abnormal LFT secondary to hepatocellular carcinoma  -Acute kidney injury IV fluid hydration Resolved  -DVT prophylaxis Subcutaneous Lovenox daily  If no improvement will benefit from transition to hospice home.  All the records are reviewed and case discussed with Care Management/Social Worker Management plans discussed with the patient, family and they are in agreement.  CODE STATUS: FULL CODE  TOTAL TIME TAKING CARE OF THIS PATIENT: 35 minutes.   POSSIBLE D/C IN 3-4 DAYS, DEPENDING ON CLINICAL CONDITION.  Leia Alf Debar Plate M.D on 01/01/2019 at 11:37 AM  Between 7am to 6pm - Pager - [phone]  After 6pm go to [email or web address] - password [email or web address] [facility]  Office  [phone]  CC: Primary care physician; Sharyne Peach, MD  Note: This dictation was prepared with Dragon  dictation along with smaller phrase technology. Any transcriptional errors that result from this process are unintentional.

## 2019-01-02 LAB — CBC WITH DIFFERENTIAL/PLATELET
Abs Immature Granulocytes: 1.56 K/uL — ABNORMAL HIGH (ref 0.00–0.07)
Basophils Absolute: 0.1 K/uL (ref 0.0–0.1)
Basophils Relative: 0 %
Eosinophils Absolute: 0 K/uL (ref 0.0–0.5)
Eosinophils Relative: 0 %
HCT: 33.4 % — ABNORMAL LOW (ref 36.0–46.0)
Hemoglobin: 10.3 g/dL — ABNORMAL LOW (ref 12.0–15.0)
Immature Granulocytes: 5 %
Lymphocytes Relative: 4 %
Lymphs Abs: 1.2 K/uL (ref 0.7–4.0)
MCH: 23.6 pg — ABNORMAL LOW (ref 26.0–34.0)
MCHC: 30.8 g/dL (ref 30.0–36.0)
MCV: 76.6 fL — ABNORMAL LOW (ref 80.0–100.0)
Monocytes Absolute: 1.2 K/uL — ABNORMAL HIGH (ref 0.1–1.0)
Monocytes Relative: 4 %
Neutro Abs: 24.9 K/uL — ABNORMAL HIGH (ref 1.7–7.7)
Neutrophils Relative %: 87 %
Platelets: 161 K/uL (ref 150–400)
RBC: 4.36 MIL/uL (ref 3.87–5.11)
RDW: 21.9 % — ABNORMAL HIGH (ref 11.5–15.5)
Smear Review: NORMAL
WBC: 28.9 K/uL — ABNORMAL HIGH (ref 4.0–10.5)
nRBC: 2 % — ABNORMAL HIGH (ref 0.0–0.2)

## 2019-01-02 LAB — COMPREHENSIVE METABOLIC PANEL
ALT: 41 U/L (ref 0–44)
AST: 62 U/L — ABNORMAL HIGH (ref 15–41)
Albumin: 2 g/dL — ABNORMAL LOW (ref 3.5–5.0)
Alkaline Phosphatase: 304 U/L — ABNORMAL HIGH (ref 38–126)
Anion gap: 6 (ref 5–15)
BUN: 25 mg/dL — ABNORMAL HIGH (ref 8–23)
CO2: 25 mmol/L (ref 22–32)
Calcium: 8.4 mg/dL — ABNORMAL LOW (ref 8.9–10.3)
Chloride: 119 mmol/L — ABNORMAL HIGH (ref 98–111)
Creatinine, Ser: 0.82 mg/dL (ref 0.44–1.00)
GFR calc Af Amer: >60 mL/min (ref >60)
GFR calc non Af Amer: >60 mL/min (ref >60)
Glucose, Bld: 178 mg/dL — ABNORMAL HIGH (ref 70–99)
Potassium: 3.5 mmol/L (ref 3.5–5.1)
Sodium: 150 mmol/L — ABNORMAL HIGH (ref 135–145)
Total Bilirubin: 1.8 mg/dL — ABNORMAL HIGH (ref 0.3–1.2)
Total Protein: 5.7 g/dL — ABNORMAL LOW (ref 6.5–8.1)

## 2019-01-02 LAB — PHOSPHORUS: Phosphorus: 2 mg/dL — ABNORMAL LOW (ref 2.5–4.6)

## 2019-01-02 LAB — PROCALCITONIN: Procalcitonin: 0.41 ng/mL

## 2019-01-02 LAB — HEPATITIS C VRS RNA DETECT BY PCR-QUAL: Hepatitis C Vrs RNA by PCR-Qual: NEGATIVE

## 2019-01-02 LAB — PROTIME-INR
INR: 1.6 — ABNORMAL HIGH (ref 0.8–1.2)
Prothrombin Time: 18.7 s — ABNORMAL HIGH (ref 11.4–15.2)

## 2019-01-02 LAB — RPR: RPR Ser Ql: NONREACTIVE

## 2019-01-02 LAB — GLUCOSE, CAPILLARY
Glucose-Capillary: 149 mg/dL — ABNORMAL HIGH (ref 70–99)
Glucose-Capillary: 151 mg/dL — ABNORMAL HIGH (ref 70–99)
Glucose-Capillary: 152 mg/dL — ABNORMAL HIGH (ref 70–99)
Glucose-Capillary: 156 mg/dL — ABNORMAL HIGH (ref 70–99)
Glucose-Capillary: 168 mg/dL — ABNORMAL HIGH (ref 70–99)
Glucose-Capillary: 169 mg/dL — ABNORMAL HIGH (ref 70–99)

## 2019-01-02 LAB — MAGNESIUM: Magnesium: 1.9 mg/dL (ref 1.7–2.4)

## 2019-01-02 LAB — APTT: aPTT: 32 s (ref 24–36)

## 2019-01-02 MED ORDER — MAGNESIUM SULFATE IN D5W 1-5 GM/100ML-% IV SOLN
1.0000 g | Freq: Once | INTRAVENOUS | Status: AC
  Administered 2019-01-02: 1 g via INTRAVENOUS
  Filled 2019-01-02: qty 100

## 2019-01-02 MED ORDER — POTASSIUM PHOSPHATES 15 MMOLE/5ML IV SOLN
20.0000 mmol | Freq: Once | INTRAVENOUS | Status: AC
  Administered 2019-01-02: 13:00:00 20 mmol via INTRAVENOUS
  Filled 2019-01-02: qty 6.67

## 2019-01-02 MED ORDER — PROCHLORPERAZINE EDISYLATE 10 MG/2ML IJ SOLN
10.0000 mg | INTRAMUSCULAR | Status: AC
  Administered 2019-01-03: 01:00:00 10 mg via INTRAVENOUS
  Filled 2019-01-02: qty 2

## 2019-01-02 MED ORDER — CHLORHEXIDINE GLUCONATE 0.12 % MT SOLN
15.0000 mL | Freq: Two times a day (BID) | OROMUCOSAL | Status: DC
  Administered 2019-01-02 – 2019-01-07 (×10): 15 mL via OROMUCOSAL
  Filled 2019-01-02 (×8): qty 15

## 2019-01-02 MED ORDER — METHYLPREDNISOLONE SODIUM SUCC 125 MG IJ SOLR
50.0000 mg | Freq: Two times a day (BID) | INTRAMUSCULAR | Status: DC
  Administered 2019-01-02: 50 mg via INTRAVENOUS
  Filled 2019-01-02: qty 2

## 2019-01-02 MED ORDER — ORAL CARE MOUTH RINSE
15.0000 mL | Freq: Two times a day (BID) | OROMUCOSAL | Status: DC
  Administered 2019-01-02 – 2019-01-03 (×3): 15 mL via OROMUCOSAL

## 2019-01-02 NOTE — Progress Notes (Signed)
Update given to daughter, Yoshiko Keleher, all questions answered. She does request that the rounding MD telephone her sister [name] today @ [phone].

## 2019-01-02 NOTE — Progress Notes (Signed)
Subjective: Sedation decreased and patient improving.    Objective: Current vital signs: BP (!) 152/83   Pulse 70   Temp 98.9 F (37.2 C) (Axillary)   Resp 15   Ht 5' 0.5" (1.537 m)   Wt 76.7 kg   SpO2 99%   BMI 32.48 kg/m  Vital signs in last 24 hours: Temp:  [97.1 F (36.2 C)-98.9 F (37.2 C)] 98.9 F (37.2 C) (07/05 0800) Pulse Rate:  [64-83] 70 (07/05 0635) Resp:  [10-24] 15 (07/05 0635) BP: (131-179)/(68-138) 152/83 (07/05 0635) SpO2:  [95 %-100 %] 99 % (07/05 0635) Weight:  [76.7 kg] 76.7 kg (07/05 0534)  Intake/Output from previous day: 07/04 0701 - 07/05 0700 In: 1923.3 [I.V.:1772.9; IV Piggyback:150.5] Out: 1010 [Urine:1010] Intake/Output this shift: No intake/output data recorded. Nutritional status:  Diet Order            Diet NPO time specified  Diet effective now              Neurologic Exam: Mental Status: Lethargic.  Does not know where she is but answers questions appropriately otherwise.  Speech fluent.  Follows commands. Cranial Nerves: II: Blinks to bilateral confrontation III,IV, VI:  extra-ocular motions intact bilaterally V,VII: smile symmetric, facial light touch sensation normal bilaterally VIII: hearing normal bilaterally IX,X: gag reflex present XI: bilateral shoulder shrug XII: midline tongue extension Motor: Able to move all extremities against gravity Sensory: Pinprick and light touch intact throughout, bilaterally  Lab Results: Basic Metabolic Panel: Recent Labs  Lab 12/14/2018 0940 12/23/2018 1813 12/28/18 0324 12/29/18 0601 12/30/18 0227 12/30/18 1555 12/31/18 0507 01/01/19 0448 01/02/19 0523  NA 135  --  140 141 144  --  144 147* 150*  K 3.6  --  3.7 3.3* 3.0* 4.0 3.8 4.4 3.5  CL 95*  --  105 105 111  --  115* 115* 119*  CO2 17*  --  21* 21* 20*  --  22 23 25   GLUCOSE 122*  --  116* 101* 98  --  171* 198* 178*  BUN 50*  --  44* 34* 36*  --  31* 27* 25*  CREATININE 3.28*  --  1.59* 0.95 0.82  --  0.92 0.97 0.82   CALCIUM 9.7  --  8.6* 8.8* 8.6*  --  8.6* 8.6* 8.4*  MG 2.7*  --  2.5*  --  2.6*  --  2.3  --  1.9  PHOS  --  2.7  --   --  3.3  --  2.4*  --  2.0*    Liver Function Tests: Recent Labs  Lab 12/29/18 0601 12/30/18 0227 12/31/18 0507 01/01/19 0448 01/02/19 0523  AST 185* 111* 80* 68* 62*  ALT 81* 64* 51* 45* 41  ALKPHOS 251* 227* 246* 310* 304*  BILITOT 3.4* 2.7* 1.7* 1.8* 1.8*  PROT 6.2* 6.0* 5.7* 6.0* 5.7*  ALBUMIN 2.5* 2.4* 2.1* 2.2* 2.0*   No results for input(s): LIPASE, AMYLASE in the last 168 hours. Recent Labs  Lab 12/02/2018 0940 12/28/18 0324 12/30/18 0954  AMMONIA 121* 44* 14    CBC: Recent Labs  Lab 12/06/2018 0940  12/29/18 0601 12/30/18 0227 12/31/18 0507 01/01/19 0448 01/02/19 0523  WBC 17.5*   < > 14.8* 15.9* 14.0* 18.1* 28.9*  NEUTROABS 14.5*  --   --   --  10.6* 14.5* 24.9*  HGB 12.7   < > 12.3 10.6* 10.3* 10.8* 10.3*  HCT 40.6   < > 39.6 35.0* 33.7* 35.1* 33.4*  MCV 75.6*   < > 77.0* 77.8* 77.6* 76.6* 76.6*  PLT 395   < > 259 237 214 198 161   < > = values in this interval not displayed.    Cardiac Enzymes: No results for input(s): CKTOTAL, CKMB, CKMBINDEX, TROPONINI in the last 168 hours.  Lipid Panel: No results for input(s): CHOL, TRIG, HDL, CHOLHDL, VLDL, LDLCALC in the last 168 hours.  CBG: Recent Labs  Lab 01/01/19 1558 01/01/19 2134 01/02/19 0012 01/02/19 0318 01/02/19 0731  GLUCAP 192* 165* 168* 151* 152*    Microbiology: Results for orders placed or performed during the hospital encounter of 12/18/2018  Blood culture (routine x 2)     Status: None   Collection Time: 12/26/2018  9:40 AM   Specimen: Blood  Result Value Ref Range Status   Specimen Description BLOOD LEFT ANTECUBITAL  Final   Special Requests   Final    BOTTLES DRAWN AEROBIC AND ANAEROBIC Blood Culture adequate volume   Culture   Final    NO GROWTH 5 DAYS Performed at [facility], [address] [place], [place] [address]    Report Status  01/01/2019 FINAL  Final  Blood culture (routine x 2)     Status: None   Collection Time: 12/05/2018  9:51 AM   Specimen: Blood  Result Value Ref Range Status   Specimen Description BLOOD LEFT HAND  Final   Special Requests   Final    BOTTLES DRAWN AEROBIC AND ANAEROBIC Blood Culture results may not be optimal due to an inadequate volume of blood received in culture bottles   Culture   Final    NO GROWTH 5 DAYS Performed at [facility], [address], [place],  [address]    Report Status 01/01/2019 FINAL  Final  SARS Coronavirus 2 (CEPHEID - Performed in [place] hospital lab), Hosp Order     Status: None   Collection Time: 12/14/2018 10:24 AM   Specimen: Nasopharyngeal Swab  Result Value Ref Range Status   SARS Coronavirus 2 NEGATIVE NEGATIVE Final    Comment: (NOTE) If result is NEGATIVE SARS-CoV-2 target nucleic acids are NOT DETECTED. The SARS-CoV-2 RNA is generally detectable in upper and lower  respiratory specimens during the acute phase of infection. The lowest  concentration of SARS-CoV-2 viral copies this assay can detect is 250  copies / mL. A negative result does not preclude SARS-CoV-2 infection  and should not be used as the sole basis for treatment or other  patient management decisions.  A negative result may occur with  improper specimen collection / handling, submission of specimen other  than nasopharyngeal swab, presence of viral mutation(s) within the  areas targeted by this assay, and inadequate number of viral copies  (<250 copies / mL). A negative result must be combined with clinical  observations, patient history, and epidemiological information. If result is POSITIVE SARS-CoV-2 target nucleic acids are DETECTED. The SARS-CoV-2 RNA is generally detectable in upper and lower  respiratory specimens dur ing the acute phase of infection.  Positive  results are indicative of active infection with SARS-CoV-2.  Clinical  correlation with  patient history and other diagnostic information is  necessary to determine patient infection status.  Positive results do  not rule out bacterial infection or co-infection with other viruses. If result is PRESUMPTIVE POSTIVE SARS-CoV-2 nucleic acids MAY BE PRESENT.   A presumptive positive result was obtained on the submitted specimen  and confirmed on repeat testing.  While 2019  novel coronavirus  (SARS-CoV-2) nucleic acids may be present in the submitted sample  additional confirmatory testing may be necessary for epidemiological  and / or clinical management purposes  to differentiate between  SARS-CoV-2 and other Sarbecovirus currently known to infect humans.  If clinically indicated additional testing with an alternate test  methodology [phone]) is advised. The SARS-CoV-2 RNA is generally  detectable in upper and lower respiratory sp ecimens during the acute  phase of infection. The expected result is Negative. Fact Sheet for Patients:  StrictlyIdeas.no Fact Sheet for Healthcare Providers: BankingDealers.co.za This test is not yet approved or cleared by the [place] FDA and has been authorized for detection and/or diagnosis of SARS-CoV-2 by FDA under an Emergency Use Authorization (EUA).  This EUA will remain in effect (meaning this test can be used) for the duration of the COVID-19 declaration under Section 564(b)(1) of the Act, 21 U.S.C. section 360bbb-3(b)(1), unless the authorization is terminated or revoked sooner. Performed at [facility], [address] [place], [place] [address]   MRSA PCR Screening     Status: None   Collection Time: 12/09/2018  3:29 PM   Specimen: Nasopharyngeal  Result Value Ref Range Status   MRSA by PCR NEGATIVE NEGATIVE Final    Comment:        The GeneXpert MRSA Assay (FDA approved for NASAL specimens only), is one component of a comprehensive MRSA colonization surveillance  program. It is not intended to diagnose MRSA infection nor to guide or monitor treatment for MRSA infections. Performed at [facility], [address] [place], [place] [address]   Urine culture     Status: None   Collection Time: 12/07/2018  3:40 PM   Specimen: Urine  Result Value Ref Range Status   Specimen Description   Final    URINE, RANDOM Performed at [facility], [address] [place], [place] [address]    Special Requests   Final    NONE Performed at [facility], [address] [place], [place] [address]    Culture   Final    NO GROWTH Performed at [facility], [place] [address], [place], [place] [address]    Report Status 12/28/2018 FINAL  Final  Gastrointestinal Panel by PCR , Stool     Status: None   Collection Time: 12/29/18  8:51 PM   Specimen: Stool  Result Value Ref Range Status   Campylobacter species NOT DETECTED NOT DETECTED Final   Plesimonas shigelloides NOT DETECTED NOT DETECTED Final   Salmonella species NOT DETECTED NOT DETECTED Final   Yersinia enterocolitica NOT DETECTED NOT DETECTED Final   Vibrio species NOT DETECTED NOT DETECTED Final   Vibrio cholerae NOT DETECTED NOT DETECTED Final   Enteroaggregative E coli (EAEC) NOT DETECTED NOT DETECTED Final   Enteropathogenic E coli (EPEC) NOT DETECTED NOT DETECTED Final   Enterotoxigenic E coli (ETEC) NOT DETECTED NOT DETECTED Final   Shiga like toxin producing E coli (STEC) NOT DETECTED NOT DETECTED Final   Shigella/Enteroinvasive E coli (EIEC) NOT DETECTED NOT DETECTED Final   Cryptosporidium NOT DETECTED NOT DETECTED Final   Cyclospora cayetanensis NOT DETECTED NOT DETECTED Final   Entamoeba histolytica NOT DETECTED NOT DETECTED Final   Giardia lamblia NOT DETECTED NOT DETECTED Final   Adenovirus F40/41 NOT DETECTED NOT DETECTED Final   Astrovirus NOT DETECTED NOT DETECTED Final   Norovirus GI/GII NOT DETECTED NOT DETECTED Final   Rotavirus A NOT DETECTED NOT  DETECTED Final  Sapovirus (I, II, IV, and V) NOT DETECTED NOT DETECTED Final    Comment: Performed at [facility], [address] [place], [place] [address]  C difficile quick scan w PCR reflex     Status: None   Collection Time: 12/29/18  8:51 PM   Specimen: Stool  Result Value Ref Range Status   C Diff antigen NEGATIVE NEGATIVE Final   C Diff toxin NEGATIVE NEGATIVE Final   C Diff interpretation No C. difficile detected.  Final    Comment: Performed at [facility], [address] [place], [place] [address]    Coagulation Studies: Recent Labs    01/02/19 0523  LABPROT 18.7*  INR 1.6*    Imaging: Mr Brain Wo Contrast  Result Date: 12/31/2018 CLINICAL DATA:  73 year old female with altered mental status, suspected sepsis. Liver tumor. EXAM: MRI HEAD WITHOUT CONTRAST TECHNIQUE: Multiplanar, multiecho pulse sequences of the brain and surrounding structures were obtained without intravenous contrast. COMPARISON:  Head CT 12/07/2018 and earlier. FINDINGS: Study is intermittently degraded by motion artifact despite repeated imaging attempts. Brain: No restricted diffusion to suggest acute infarction. No midline shift, mass effect, evidence of mass lesion, ventriculomegaly, extra-axial collection or acute intracranial hemorrhage. Cervicomedullary junction and pituitary are within normal limits. Scattered bilateral cerebral white matter T2 and FLAIR hyperintensity, mild to moderate for age. No cortical encephalomalacia or chronic cerebral blood products identified. No intrinsic increased T1 signal in the deep gray matter nuclei. The basal ganglia, thalami, brainstem, and cerebellum appear normal for age. Vascular: Major intracranial vascular flow voids are preserved. Skull and upper cervical spine: Negative visible cervical spine. Visible bone marrow signal appears within normal limits. Sinuses/Orbits: Negative orbits. Paranasal sinuses and mastoids are stable and well  pneumatized. Other: Scalp and face soft tissues appear negative. IMPRESSION: 1.  No acute intracranial abnormality. 2. Mild to moderate for age cerebral white matter signal changes, most commonly due to chronic small vessel disease. Electronically Signed   By: Genevie Ann M.D.   On: 12/31/2018 16:09   Mr 3d Recon At Scanner  Result Date: 12/31/2018 CLINICAL DATA:  Inpatient. 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 Recent initiation of chemotherapy. Patient admitted with abnormal liver function tests, sepsis, altered mental status. EXAM: MRI ABDOMEN WITHOUT AND WITH CONTRAST (INCLUDING MRCP) TECHNIQUE: Multiplanar multisequence MR imaging of the abdomen was performed both before and after the administration of intravenous contrast. Heavily T2-weighted images of the biliary and pancreatic ducts were obtained, and three-dimensional MRCP images were rendered by post processing. CONTRAST:  7 cc Gadavist IV. COMPARISON:  11/11/2018 MRI abdomen. FINDINGS: Lower chest: Small dependent bilateral pleural effusions, right greater than left, new from prior MRI. Passive atelectasis at the dependent lung bases bilaterally. Hepatobiliary: There are multiple heterogeneously enhancing poorly defined masses scattered throughout the right greater than left liver lobes, most of which are new since 11/11/2018 MRI. For example, right liver dome 2.5 x 2.0 cm mass (series 19/image 18), segment 7 right liver lobe 2.7 x 2.2 cm mass (series 19/image 24), peripheral right liver lobe 3.6 x 2.0 cm mass (series 19/image 28) and segment 2 left liver lobe 1.7 x 1.1 cm mass (series 19/image 29) are all new. Segment 8 right liver lobe 2.4 x 2.1 cm mass (series 19/image 22), previously 2.4 x 2.4 cm, not appreciably changed. 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 Cholecystectomy. No biliary ductal dilatation. Common bile duct diameter 4 mm. No evidence of choledocholithiasis. Pancreas: Mild diffuse peripancreatic edema. No pancreatic mass or duct dilation. Spleen: Normal size. No mass. Adrenals/Urinary Tract: Normal adrenals. No hydronephrosis. Normal kidneys with no renal mass. Stomach/Bowel: Normal non-distended stomach. Normal caliber visualized bowel loops. Generalized wall thickening in visualized small and large bowel. Vascular/Lymphatic: Nonaneurysmal abdominal aorta. Patent hepatic, splenic and renal veins. Patent SMV. No pathologically enlarged lymph nodes in the abdomen. Other: Small volume perihepatic ascites.  No focal fluid collection. Musculoskeletal: No aggressive appearing focal osseous lesions. IMPRESSION: 1. Several heterogeneously enhancing poorly defined liver masses scattered throughout the right greater than left liver lobes, most of which are new since 11/11/2018 MRI. Findings are compatible with progression of multifocal hepatocellular carcinoma. Previously visualized solitary segment 8 right liver mass is stable. 2. 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 3. Small volume perihepatic ascites. 4. Generalized bowel wall thickening and mild peripancreatic edema, nonspecific, potentially due to a portal hypertension and/or hypoproteinemia. 5. Small dependent bilateral pleural effusions, right greater than left, new. Electronically Signed   By: Ilona Sorrel M.D.   On: 12/31/2018 19:46   Mr Abdomen Mrcp Moise Boring Contast  Result Date: 12/31/2018 CLINICAL DATA:  Inpatient. 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 Recent initiation of chemotherapy. Patient admitted with abnormal liver function tests, sepsis, altered mental status. EXAM: MRI ABDOMEN WITHOUT AND WITH CONTRAST (INCLUDING MRCP) TECHNIQUE:  Multiplanar multisequence MR imaging of the abdomen was performed both before and after the administration of intravenous contrast. Heavily T2-weighted images of the biliary and pancreatic ducts were obtained, and three-dimensional MRCP images were rendered by post processing. CONTRAST:  7 cc Gadavist IV. COMPARISON:  11/11/2018 MRI abdomen. FINDINGS: Lower chest: Small dependent bilateral pleural effusions, right greater than left, new from prior MRI. Passive atelectasis at the dependent lung bases bilaterally. Hepatobiliary: There are multiple heterogeneously enhancing poorly defined masses scattered throughout the right greater than left liver lobes, most of which are new since 11/11/2018 MRI. For example, right liver dome 2.5 x 2.0 cm mass (series 19/image 18), segment 7 right liver lobe 2.7 x 2.2 cm mass (series 19/image 24), peripheral right liver lobe 3.6 x 2.0 cm mass (series 19/image 28) and segment 2 left liver lobe 1.7 x 1.1 cm mass (series 19/image 29) are all new. Segment 8 right liver lobe 2.4 x 2.1 cm mass (series 19/image 22), previously 2.4 x 2.4 cm, not appreciably changed. 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 Cholecystectomy. No biliary ductal dilatation. Common bile duct diameter 4 mm. No evidence of choledocholithiasis. Pancreas: Mild diffuse peripancreatic edema. No pancreatic mass or duct dilation. Spleen: Normal size. No mass. Adrenals/Urinary Tract: Normal adrenals. No hydronephrosis. Normal kidneys with no renal mass. Stomach/Bowel: Normal non-distended stomach. Normal caliber visualized bowel loops. Generalized wall thickening in visualized small and large bowel. Vascular/Lymphatic: Nonaneurysmal abdominal aorta. Patent hepatic, splenic and renal veins. Patent SMV. No pathologically enlarged lymph nodes in the abdomen. Other: Small volume  perihepatic ascites.  No focal fluid collection. Musculoskeletal: No aggressive appearing focal osseous lesions. IMPRESSION: 1. Several heterogeneously enhancing poorly defined liver masses scattered throughout the right greater than left liver lobes, most of which are new since 11/11/2018 MRI. Findings are compatible with progression of multifocal hepatocellular  carcinoma. Previously visualized solitary segment 8 right liver mass is stable. 2. 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 3. Small volume perihepatic ascites. 4. Generalized bowel wall thickening and mild peripancreatic edema, nonspecific, potentially due to a portal hypertension and/or hypoproteinemia. 5. Small dependent bilateral pleural effusions, right greater than left, new. Electronically Signed   By: Ilona Sorrel M.D.   On: 12/31/2018 19:46    Medications:  I have reviewed the patient's current medications. Scheduled: . chlorhexidine  15 mL Mouth Rinse BID  . fentaNYL  1 patch Transdermal Q72H  . mouth rinse  15 mL Mouth Rinse q12n4p  . methylPREDNISolone (SOLU-MEDROL) injection  50 mg Intravenous Q8H  . pantoprazole (PROTONIX) IV  40 mg Intravenous Q12H  . sodium chloride flush  10-40 mL Intracatheter Q12H    Assessment/Plan: Patient shows significant signs of improvement.  Etiology remains unclear with metabolic encephalopathy, immune encephalitis and infection remaining on the differential.  Remains on steroids and antibiotics.  Continues to be followed by ID and oncology as well.    Recommendations: 1. Will continue to follow with you.   LOS: 6 days   Alexis Goodell, MD Neurology [phone] 01/02/2019  10:28 AM

## 2019-01-02 NOTE — Progress Notes (Signed)
CRITICAL CARE NOTE      CHIEF COMPLAINT:   Altered mental status with lethargy in context of new diagnosis of hepatocellular carcinoma   SUBJECTIVE FINDINGS AND OVERNIGHT EVENTS    75 F w/hx of newly diagnosed [place] with extensive portal venous thrombosis s/p immunotherapy came in with lethargy and AKI, hyperammonemia/hepatic encephalopathy, possible TLS with uremic encephalopathy, possible sepsis and encephalopathy secondary to chemotherapy.    S/ p evaluation by neuro and ID - appreciate input - less likely infectious.   Concern with LP due to Avastin therapy  Discussed this am with oncology Dr Mike Gip she has initiated [facility] transfer to help with management of possible imunotherapy related encephalopathy.   Patient is now alert, oriented and communicantes appropriately.  Daughter [name] is very pleased with mothers mentation improving.   PAST MEDICAL HISTORY   Past Medical History:  Diagnosis Date  . Hypertension    not currently taking meds     SURGICAL HISTORY   Past Surgical History:  Procedure Laterality Date  . BREAST BIOPSY Left    neg  . CHOLECYSTECTOMY  1992     FAMILY HISTORY   Family History  Problem Relation Age of Onset  . Alzheimer's disease Mother   . Stroke Father   . Breast cancer Daughter 41       [place], genetic test negative     SOCIAL HISTORY   Social History   Tobacco Use  . Smoking status: Never Smoker  . Smokeless tobacco: Never Used  Substance Use Topics  . Alcohol use: No  . Drug use: No     MEDICATIONS   Current Medication:  Current Facility-Administered Medications:  .  chlorhexidine (PERIDEX) 0.12 % solution 15 mL, 15 mL, Mouth Rinse, BID, Tukov-Yual, Magdalene S, NP, 15 mL at 01/02/19 0132 .  dexmedetomidine (PRECEDEX) 400 MCG/100ML (4 mcg/mL)  infusion, 0.4-1.2 mcg/kg/hr, Intravenous, Titrated, [name] D, NP, Last Rate: 22.5 mL/hr at 01/02/19 0600, 1.2 mcg/kg/hr at 01/02/19 0600 .  dextrose 5 % in lactated ringers infusion, , Intravenous, Continuous, Blakeney, Dana G, NP, Last Rate: 50 mL/hr at 01/02/19 0600 .  fentaNYL (DURAGESIC) 25 MCG/HR 1 patch, 1 patch, Transdermal, Q72H, Awilda Bill, NP, 1 patch at 12/31/18 1208 .  fentaNYL (SUBLIMAZE) injection 12.5 mcg, 12.5 mcg, Intravenous, Q2H PRN, [name] D, NP, 12.5 mcg at 01/01/19 1028 .  MEDLINE mouth rinse, 15 mL, Mouth Rinse, q12n4p, Tukov-Yual, Magdalene S, NP .  methylPREDNISolone sodium succinate (SOLU-MEDROL) 125 mg/2 mL injection 50 mg, 50 mg, Intravenous, Q8H, Blakeney, Dana G, NP, 50 mg at 01/02/19 0531 .  ondansetron (ZOFRAN) tablet 4 mg, 4 mg, Oral, Q6H PRN **OR** ondansetron (ZOFRAN) injection 4 mg, 4 mg, Intravenous, Q6H PRN, Pyreddy, Pavan, MD .  pantoprazole (PROTONIX) injection 40 mg, 40 mg, Intravenous, Q12H, Blakeney, Dana G, NP, 40 mg at 01/01/19 2140 .  piperacillin-tazobactam (ZOSYN) IVPB 3.375 g, 3.375 g, Intravenous, Q8H, Blakeney, Dana G, NP, Last Rate: 12.5 mL/hr at 01/02/19 0600 .  simethicone (MYLICON) chewable tablet 80 mg, 80 mg, Oral, Q6H PRN, Awilda Bill, NP .  sodium chloride flush (NS) 0.9 % injection 10-40 mL, 10-40 mL, Intracatheter, Q12H, Sudini, Srikar, MD, 10 mL at 01/01/19 2143 .  sodium chloride flush (NS) 0.9 % injection 10-40 mL, 10-40 mL, Intracatheter, PRN, Sudini, Srikar, MD    ALLERGIES   Lisinopril    REVIEW OF SYSTEMS     Unable to obtain due to obtunded state and lethargy  PHYSICAL EXAMINATION  Vitals:   01/02/19 0500 01/02/19 0635  BP: (!) 167/87 (!) 152/83  Pulse: 68 70  Resp: 13 15  Temp:    SpO2: 98% 99%    GENERAL: Age-appropriate patient with altered mental status HEAD: Normocephalic, atraumatic.  EYES: Pupils equal, round, reactive to light.  No scleral icterus.  MOUTH: Moist mucosal  membrane. NECK: Supple. No thyromegaly. No nodules. No JVD.  PULMONARY: Clear to auscultation bilaterally  CARDIOVASCULAR: S1 and S2. Regular rate and rhythm. No murmurs, rubs, or gallops.  GASTROINTESTINAL: Soft, nontender, non-distended. No masses. Positive bowel sounds. No hepatosplenomegaly.  MUSCULOSKELETAL: No swelling, clubbing, or edema.  NEUROLOGIC: Mild distress due to acute illness SKIN:intact,warm,dry   LABS AND IMAGING     LAB RESULTS: Recent Labs  Lab 12/31/18 0507 01/01/19 0448 01/02/19 0523  NA 144 147* 150*  K 3.8 4.4 3.5  CL 115* 115* 119*  CO2 22 23 25   BUN 31* 27* 25*  CREATININE 0.92 0.97 0.82  GLUCOSE 171* 198* 178*   Recent Labs  Lab 12/31/18 0507 01/01/19 0448 01/02/19 0523  HGB 10.3* 10.8* 10.3*  HCT 33.7* 35.1* 33.4*  WBC 14.0* 18.1* 28.9*  PLT 214 198 161     IMAGING RESULTS: No results found.    ASSESSMENT AND PLAN    -Multidisciplinary rounds held today  Altered mental status with lethargy  - likely toxic metabolic(uremic and hepatic encephalopathy) vs poss septic in etiology vs toxic metabolic. 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 ,cured    Acute Kidney Injury - KDIGO stage 3  - noted UA  - d/c non-essential nephrotoxic medications  - suspect partially  due to transient hypotension  - renal US   - urine electrolytes     Transaminitis -Possibly due to transient hypotension, hepatic congestion in lieu of extensive thormbosis of portal venous circulation, HCC itself, and with component possibly related to recent immunotherapy  -DC nonessential essential hepatotoxic medications and IV resuscitation -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, 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 with assumed cure -GI on case-appreciate input   GI/Nutrition GI PROPHYLAXIS as indicated DIET-->TF's as tolerated Constipation protocol as indicated   ENDO - ICU hypoglycemic\Hyperglycemia protocol -check FSBS per protocol   ELECTROLYTES -follow labs as needed -replace as needed -pharmacy consultation   DVT/GI PRX ordered -SCDs  TRANSFUSIONS AS NEEDED MONITOR FSBS ASSESS the need for LABS as needed   Critical care provider statement:    Critical care time (minutes):  33   Critical care time was exclusive of:  Separately billable procedures and treating other patients   Critical care was necessary to treat or prevent imminent or life-threatening deterioration of the following conditions:   Acute mental status with lethargy, transaminitis, newly diagnosed hepatocellular carcinoma, extensive venous portal thrombosis, acute kidney injury stage III, probable sepsis, multiple comorbid conditions   Critical care was time spent  personally by me on the following activities:  Development of treatment plan with patient or surrogate, discussions with consultants, evaluation of patient's response to treatment, examination of patient, obtaining history from patient or surrogate, ordering and performing treatments and interventions, ordering and review of laboratory studies and re-evaluation of patient's condition.  I assumed direction of critical care for this patient from another provider in my specialty: no    This document was prepared using Dragon voice recognition software and  may include unintentional dictation errors.    Ottie Glazier, M.D.  Division of [place]

## 2019-01-02 NOTE — Progress Notes (Signed)
[facility] Hematology/Oncology Progress Note  Date of admission: 12/07/2018  Hospital day:  01/02/2019  Chief Complaint: Abigail Ayers is a 73 y.o. female with hepatocellular carcinoma who was admitted with altered mental status.  Subjective:  Patient off Precedex.  Patient interactive, talking , and following commands.  Social History: Patient is accompanied by her nurse and the sitter.  Allergies:  Allergies  Allergen Reactions  . Lisinopril Other (See Comments)    Other reaction(s): Cough    Scheduled Medications: . chlorhexidine  15 mL Mouth Rinse BID  . fentaNYL  1 patch Transdermal Q72H  . mouth rinse  15 mL Mouth Rinse q12n4p  . methylPREDNISolone (SOLU-MEDROL) injection  50 mg Intravenous Q8H  . pantoprazole (PROTONIX) IV  40 mg Intravenous Q12H  . sodium chloride flush  10-40 mL Intracatheter Q12H    Review of Systems: GENERAL: Wants to get out of bed. PERFORMANCE STATUS (ECOG):  2-3 HEENT:  Denies visual changes, sore throat, or tenderness. Lungs: Denies shortness of breath or cough.  Cardiac:  Denies chest pain. GI:  No nausea, vomiting, diarrhea, constipation, melena or hematochezia. GU:  Denies urinary symptoms. Musculoskeletal:  No back pain. Extremities:  No pain. Neuro:  Denies headache, numbness or weakness. Psych:  Wants "to get out of bed/get up".  Wants "to die". Pain:  No focal pain. Review of systems:  All other systems reviewed and found to be negative.  Physical Exam: Blood pressure (!) 168/96, pulse 61, temperature 98.9 F (37.2 C), temperature source Axillary, resp. rate 12, height 5' 0.5" (1.537 m), weight 169 lb 1.5 oz (76.7 kg), SpO2 99 %.  GENERAL:  Chronically ill appearing woman conversant in the ICU in no acute distress. MENTAL STATUS:  Alert and oriented to person, place.  States that it is "1920".  President is Trump. HEAD:  Graying braided hair.  Normocephalic, atraumatic, face symmetric, no Cushingoid  features. EYES:  Brown eyes.  Pupils equal round and reactive to light and accomodation.  No conjunctivitis or scleral icterus. ENT:  Oropharynx clear without lesion.  Tongue normal. Mucous membranes moist.  RESPIRATORY:  Clear to auscultation without rales, wheezes or rhonchi. CARDIOVASCULAR:  Regular rate and rhythm without murmur, rub or gallop. ABDOMEN:  Soft, non-tender, with active bowel sounds, and no hepatosplenomegaly.  No masses. SKIN:  No rashes, ulcers or lesions. EXTREMITIES: No edema, no skin discoloration or tenderness.  No palpable cords. LYMPH NODES: No palpable cervical, supraclavicular, axillary or inguinal adenopathy  NEUROLOGICAL: Appropriate.  Normal conversation.  Answers questions appropriately.  No apparent confusion.  Follows commands.   Results for orders placed or performed during the hospital encounter of 12/25/2018 (from the past 48 hour(s))  Ferritin     Status: Abnormal   Collection Time: 12/31/18  5:38 PM  Result Value Ref Range   Ferritin 1,625 (H) 11 - 307 ng/mL    Comment: Performed at [facility], [address] [place], [place] [address]  Glucose, capillary     Status: Abnormal   Collection Time: 12/31/18  8:21 PM  Result Value Ref Range   Glucose-Capillary 156 (H) 70 - 99 mg/dL  Glucose, capillary     Status: Abnormal   Collection Time: 12/31/18 11:14 PM  Result Value Ref Range   Glucose-Capillary 163 (H) 70 - 99 mg/dL  Glucose, capillary     Status: Abnormal   Collection Time: 01/01/19  3:18 AM  Result Value Ref Range   Glucose-Capillary 185 (H) 70 - 99 mg/dL  Procalcitonin     Status: None   Collection Time: 01/01/19  4:48 AM  Result Value Ref Range   Procalcitonin 0.48 ng/mL    Comment:        Interpretation: PCT (Procalcitonin) <= 0.5 ng/mL: Systemic infection (sepsis) is not likely. Local bacterial infection is possible. (NOTE)       Sepsis PCT Algorithm           Lower Respiratory Tract                                       Infection PCT Algorithm    ----------------------------     ----------------------------         PCT < 0.25 ng/mL                PCT < 0.10 ng/mL         Strongly encourage             Strongly discourage   discontinuation of antibiotics    initiation of antibiotics    ----------------------------     -----------------------------       PCT 0.25 - 0.50 ng/mL            PCT 0.10 - 0.25 ng/mL               OR       >80% decrease in PCT            Discourage initiation of                                            antibiotics      Encourage discontinuation           of antibiotics    ----------------------------     -----------------------------         PCT >= 0.50 ng/mL              PCT 0.26 - 0.50 ng/mL               AND        <80% decrease in PCT             Encourage initiation of                                             antibiotics       Encourage continuation           of antibiotics    ----------------------------     -----------------------------        PCT >= 0.50 ng/mL                  PCT > 0.50 ng/mL               AND         increase in PCT                  Strongly encourage                                      initiation of  antibiotics    Strongly encourage escalation           of antibiotics                                     -----------------------------                                           PCT <= 0.25 ng/mL                                                 OR                                        > 80% decrease in PCT                                     Discontinue / Do not initiate                                             antibiotics Performed at [facility], [address] [place], [place] [address]   CBC with Differential/Platelet     Status: Abnormal   Collection Time: 01/01/19  4:48 AM  Result Value Ref Range   WBC 18.1 (H) 4.0 - 10.5 K/uL   RBC 4.58 3.87 - 5.11 MIL/uL   Hemoglobin 10.8 (L) 12.0 - 15.0 g/dL   HCT 35.1 (L) 36.0 -  46.0 %   MCV 76.6 (L) 80.0 - 100.0 fL   MCH 23.6 (L) 26.0 - 34.0 pg   MCHC 30.8 30.0 - 36.0 g/dL   RDW 21.6 (H) 11.5 - 15.5 %   Platelets 198 150 - 400 K/uL   nRBC 3.6 (H) 0.0 - 0.2 %   Neutrophils Relative % 81 %   Neutro Abs 14.5 (H) 1.7 - 7.7 K/uL   Lymphocytes Relative 7 %   Lymphs Abs 1.4 0.7 - 4.0 K/uL   Monocytes Relative 4 %   Monocytes Absolute 0.8 0.1 - 1.0 K/uL   Eosinophils Relative 0 %   Eosinophils Absolute 0.0 0.0 - 0.5 K/uL   Basophils Relative 0 %   Basophils Absolute 0.1 0.0 - 0.1 K/uL   WBC Morphology MILD LEFT SHIFT (1-5% METAS, OCC MYELO, OCC BANDS)    Smear Review Normal platelet morphology    Immature Granulocytes 8 %   Abs Immature Granulocytes 1.39 (H) 0.00 - 0.07 K/uL   Polychromasia PRESENT    Target Cells PRESENT     Comment: Performed at [facility], [address], [place], [place] [address]  Comprehensive metabolic panel     Status: Abnormal   Collection Time: 01/01/19  4:48 AM  Result Value Ref Range   Sodium 147 (H) 135 - 145 mmol/L   Potassium 4.4 3.5 - 5.1 mmol/L   Chloride 115 (H) 98 - 111 mmol/L   CO2  23 22 - 32 mmol/L   Glucose, Bld 198 (H) 70 - 99 mg/dL   BUN 27 (H) 8 - 23 mg/dL   Creatinine, Ser 0.97 0.44 - 1.00 mg/dL   Calcium 8.6 (L) 8.9 - 10.3 mg/dL   Total Protein 6.0 (L) 6.5 - 8.1 g/dL   Albumin 2.2 (L) 3.5 - 5.0 g/dL   AST 68 (H) 15 - 41 U/L   ALT 45 (H) 0 - 44 U/L   Alkaline Phosphatase 310 (H) 38 - 126 U/L   Total Bilirubin 1.8 (H) 0.3 - 1.2 mg/dL   GFR calc non Af Amer 59 (L) >60 mL/min   GFR calc Af Amer >60 >60 mL/min   Anion gap 9 5 - 15    Comment: Performed at [facility], [address] [place], [place] [address]  RPR     Status: None   Collection Time: 01/01/19  4:48 AM  Result Value Ref Range   RPR Ser Ql Non Reactive Non Reactive    Comment: (NOTE) Performed At: [facility] [address] [place], [place] [id number] Rush Farmer MD YP:[id number]   Glucose, capillary      Status: Abnormal   Collection Time: 01/01/19  7:21 AM  Result Value Ref Range   Glucose-Capillary 205 (H) 70 - 99 mg/dL  Glucose, capillary     Status: Abnormal   Collection Time: 01/01/19 11:43 AM  Result Value Ref Range   Glucose-Capillary 180 (H) 70 - 99 mg/dL  Glucose, capillary     Status: Abnormal   Collection Time: 01/01/19  3:58 PM  Result Value Ref Range   Glucose-Capillary 192 (H) 70 - 99 mg/dL  Glucose, capillary     Status: Abnormal   Collection Time: 01/01/19  9:34 PM  Result Value Ref Range   Glucose-Capillary 165 (H) 70 - 99 mg/dL  Glucose, capillary     Status: Abnormal   Collection Time: 01/02/19 12:12 AM  Result Value Ref Range   Glucose-Capillary 168 (H) 70 - 99 mg/dL  Glucose, capillary     Status: Abnormal   Collection Time: 01/02/19  3:18 AM  Result Value Ref Range   Glucose-Capillary 151 (H) 70 - 99 mg/dL  Magnesium     Status: None   Collection Time: 01/02/19  5:23 AM  Result Value Ref Range   Magnesium 1.9 1.7 - 2.4 mg/dL    Comment: Performed at [facility], [address] [place], [place] [address]  Phosphorus     Status: Abnormal   Collection Time: 01/02/19  5:23 AM  Result Value Ref Range   Phosphorus 2.0 (L) 2.5 - 4.6 mg/dL    Comment: Performed at [facility], [address] [place], [place] [address]  Protime-INR     Status: Abnormal   Collection Time: 01/02/19  5:23 AM  Result Value Ref Range   Prothrombin Time 18.7 (H) 11.4 - 15.2 seconds   INR 1.6 (H) 0.8 - 1.2    Comment: (NOTE) INR goal varies based on device and disease states. Performed at [facility], [address] [place], [place] [address]   APTT     Status: None   Collection Time: 01/02/19  5:23 AM  Result Value Ref Range   aPTT 32 24 - 36 seconds    Comment: Performed at [facility], [address] [place], [place] [address]  CBC with Differential     Status: Abnormal   Collection Time: 01/02/19  5:23 AM  Result Value Ref  Range   WBC 28.9 (H) 4.0 - 10.5 K/uL   RBC 4.36 3.87 - 5.11 MIL/uL   Hemoglobin 10.3 (L) 12.0 - 15.0 g/dL   HCT 33.4 (L) 36.0 - 46.0 %   MCV 76.6 (L) 80.0 - 100.0 fL   MCH 23.6 (L) 26.0 - 34.0 pg   MCHC 30.8 30.0 - 36.0 g/dL   RDW 21.9 (H) 11.5 - 15.5 %   Platelets 161 150 - 400 K/uL   nRBC 2.0 (H) 0.0 - 0.2 %   Neutrophils Relative % 87 %   Neutro Abs 24.9 (H) 1.7 - 7.7 K/uL   Lymphocytes Relative 4 %   Lymphs Abs 1.2 0.7 - 4.0 K/uL   Monocytes Relative 4 %   Monocytes Absolute 1.2 (H) 0.1 - 1.0 K/uL   Eosinophils Relative 0 %   Eosinophils Absolute 0.0 0.0 - 0.5 K/uL   Basophils Relative 0 %   Basophils Absolute 0.1 0.0 - 0.1 K/uL   RBC Morphology HYPOCHROMIA,    Smear Review Normal platelet morphology    Immature Granulocytes 5 %   Abs Immature Granulocytes 1.56 (H) 0.00 - 0.07 K/uL   Tear Drop Cells PRESENT    Polychromasia PRESENT     Comment: Performed at [facility], [address] [place], [place] [address]  Comprehensive metabolic panel     Status: Abnormal   Collection Time: 01/02/19  5:23 AM  Result Value Ref Range   Sodium 150 (H) 135 - 145 mmol/L   Potassium 3.5 3.5 - 5.1 mmol/L   Chloride 119 (H) 98 - 111 mmol/L   CO2 25 22 - 32 mmol/L   Glucose, Bld 178 (H) 70 - 99 mg/dL   BUN 25 (H) 8 - 23 mg/dL   Creatinine, Ser 0.82 0.44 - 1.00 mg/dL   Calcium 8.4 (L) 8.9 - 10.3 mg/dL   Total Protein 5.7 (L) 6.5 - 8.1 g/dL   Albumin 2.0 (L) 3.5 - 5.0 g/dL   AST 62 (H) 15 - 41 U/L   ALT 41 0 - 44 U/L   Alkaline Phosphatase 304 (H) 38 - 126 U/L   Total Bilirubin 1.8 (H) 0.3 - 1.2 mg/dL   GFR calc non Af Amer >60 >60 mL/min   GFR calc Af Amer >60 >60 mL/min   Anion gap 6 5 - 15    Comment: Performed at [facility], [address] [place], [place] [address]  Glucose, capillary     Status: Abnormal   Collection Time: 01/02/19  7:31 AM  Result Value Ref Range   Glucose-Capillary 152 (H) 70 - 99 mg/dL   Mr Brain Wo Contrast  Result Date:  12/31/2018 CLINICAL DATA:  73 year old female with altered mental status, suspected sepsis. Liver tumor. EXAM: MRI HEAD WITHOUT CONTRAST TECHNIQUE: Multiplanar, multiecho pulse sequences of the brain and surrounding structures were obtained without intravenous contrast. COMPARISON:  Head CT 12/07/2018 and earlier. FINDINGS: Study is intermittently degraded by motion artifact despite repeated imaging attempts. Brain: No restricted diffusion to suggest acute infarction. No midline shift, mass effect, evidence of mass lesion, ventriculomegaly, extra-axial collection or acute intracranial hemorrhage. Cervicomedullary junction and pituitary are within normal limits. Scattered bilateral cerebral white matter T2 and FLAIR hyperintensity, mild to moderate for age. No cortical encephalomalacia or chronic cerebral blood products identified. No intrinsic increased T1 signal in the deep gray matter nuclei. The basal ganglia, thalami, brainstem, and cerebellum appear normal for age. Vascular: Major intracranial vascular flow voids  are preserved. Skull and upper cervical spine: Negative visible cervical spine. Visible bone marrow signal appears within normal limits. Sinuses/Orbits: Negative orbits. Paranasal sinuses and mastoids are stable and well pneumatized. Other: Scalp and face soft tissues appear negative. IMPRESSION: 1.  No acute intracranial abnormality. 2. Mild to moderate for age cerebral white matter signal changes, most commonly due to chronic small vessel disease. Electronically Signed   By: Genevie Ann M.D.   On: 12/31/2018 16:09   Mr 3d Recon At Scanner  Result Date: 12/31/2018 CLINICAL DATA:  Inpatient. 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 Recent initiation of chemotherapy. Patient admitted with abnormal liver function tests, sepsis, altered mental status. EXAM: MRI ABDOMEN WITHOUT AND WITH CONTRAST (INCLUDING MRCP) TECHNIQUE: Multiplanar multisequence  MR imaging of the abdomen was performed both before and after the administration of intravenous contrast. Heavily T2-weighted images of the biliary and pancreatic ducts were obtained, and three-dimensional MRCP images were rendered by post processing. CONTRAST:  7 cc Gadavist IV. COMPARISON:  11/11/2018 MRI abdomen. FINDINGS: Lower chest: Small dependent bilateral pleural effusions, right greater than left, new from prior MRI. Passive atelectasis at the dependent lung bases bilaterally. Hepatobiliary: There are multiple heterogeneously enhancing poorly defined masses scattered throughout the right greater than left liver lobes, most of which are new since 11/11/2018 MRI. For example, right liver dome 2.5 x 2.0 cm mass (series 19/image 18), segment 7 right liver lobe 2.7 x 2.2 cm mass (series 19/image 24), peripheral right liver lobe 3.6 x 2.0 cm mass (series 19/image 28) and segment 2 left liver lobe 1.7 x 1.1 cm mass (series 19/image 29) are all new. Segment 8 right liver lobe 2.4 x 2.1 cm mass (series 19/image 22), previously 2.4 x 2.4 cm, not appreciably changed. 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 Cholecystectomy. No biliary ductal dilatation. Common bile duct diameter 4 mm. No evidence of choledocholithiasis. Pancreas: Mild diffuse peripancreatic edema. No pancreatic mass or duct dilation. Spleen: Normal size. No mass. Adrenals/Urinary Tract: Normal adrenals. No hydronephrosis. Normal kidneys with no renal mass. Stomach/Bowel: Normal non-distended stomach. Normal caliber visualized bowel loops. Generalized wall thickening in visualized small and large bowel. Vascular/Lymphatic: Nonaneurysmal abdominal aorta. Patent hepatic, splenic and renal veins. Patent SMV. No pathologically enlarged lymph nodes in the abdomen. Other: Small volume perihepatic ascites.  No  focal fluid collection. Musculoskeletal: No aggressive appearing focal osseous lesions. IMPRESSION: 1. Several heterogeneously enhancing poorly defined liver masses scattered throughout the right greater than left liver lobes, most of which are new since 11/11/2018 MRI. Findings are compatible with progression of multifocal hepatocellular carcinoma. Previously visualized solitary segment 8 right liver mass is stable. 2. 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 3. Small volume perihepatic ascites. 4. Generalized bowel wall thickening and mild peripancreatic edema, nonspecific, potentially due to a portal hypertension and/or hypoproteinemia. 5. Small dependent bilateral pleural effusions, right greater than left, new. Electronically Signed   By: Ilona Sorrel M.D.   On: 12/31/2018 19:46   Mr Abdomen Mrcp Moise Boring Contast  Result Date: 12/31/2018 CLINICAL DATA:  Inpatient. 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 Recent initiation of chemotherapy. Patient admitted with abnormal liver function tests, sepsis, altered mental status. EXAM: MRI ABDOMEN WITHOUT AND WITH CONTRAST (INCLUDING MRCP) TECHNIQUE: Multiplanar multisequence MR imaging of  the abdomen was performed both before and after the administration of intravenous contrast. Heavily T2-weighted images of the biliary and pancreatic ducts were obtained, and three-dimensional MRCP images were rendered by post processing. CONTRAST:  7 cc Gadavist IV. COMPARISON:  11/11/2018 MRI abdomen. FINDINGS: Lower chest: Small dependent bilateral pleural effusions, right greater than left, new from prior MRI. Passive atelectasis at the dependent lung bases bilaterally. Hepatobiliary: There are multiple heterogeneously enhancing poorly defined masses scattered throughout the right greater than left liver lobes, most of which are new since 11/11/2018  MRI. For example, right liver dome 2.5 x 2.0 cm mass (series 19/image 18), segment 7 right liver lobe 2.7 x 2.2 cm mass (series 19/image 24), peripheral right liver lobe 3.6 x 2.0 cm mass (series 19/image 28) and segment 2 left liver lobe 1.7 x 1.1 cm mass (series 19/image 29) are all new. Segment 8 right liver lobe 2.4 x 2.1 cm mass (series 19/image 22), previously 2.4 x 2.4 cm, not appreciably changed. 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 Cholecystectomy. No biliary ductal dilatation. Common bile duct diameter 4 mm. No evidence of choledocholithiasis. Pancreas: Mild diffuse peripancreatic edema. No pancreatic mass or duct dilation. Spleen: Normal size. No mass. Adrenals/Urinary Tract: Normal adrenals. No hydronephrosis. Normal kidneys with no renal mass. Stomach/Bowel: Normal non-distended stomach. Normal caliber visualized bowel loops. Generalized wall thickening in visualized small and large bowel. Vascular/Lymphatic: Nonaneurysmal abdominal aorta. Patent hepatic, splenic and renal veins. Patent SMV. No pathologically enlarged lymph nodes in the abdomen. Other: Small volume perihepatic ascites.  No focal fluid collection. Musculoskeletal: No aggressive appearing focal osseous lesions. IMPRESSION: 1. Several heterogeneously enhancing poorly defined liver masses scattered throughout the right greater than left liver lobes, most of which are new since 11/11/2018 MRI. Findings are compatible with progression of multifocal hepatocellular carcinoma. Previously visualized solitary segment 8 right liver mass is stable. 2. 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 3. Small volume perihepatic ascites. 4. Generalized bowel wall thickening and mild peripancreatic edema, nonspecific, potentially due to a  portal hypertension and/or hypoproteinemia. 5. Small dependent bilateral pleural effusions, right greater than left, new. Electronically Signed   By: Ilona Sorrel M.D.   On: 12/31/2018 19:46    Assessment:  Abigail Ayers is a 73 y.o. 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  She was admitted with altered mental status.  Abdomen MRI on 12/31/2018 revealed several heterogeneously enhancing poorly defined liver masses scattered throughout the right > left liver lobes, most of which are new since 11/11/2018. Findings are compatible with progression of multifocal hepatocellular carcinoma. Previously visualized solitary segment 8 right liver mass was stable.  There was near occlusive tumor thrombus in the distal main portal vein and right and left portal veins, slightly decreased.  There was small volume perihepatic ascites.   Head MRI on 12/31/2018 revealed no acute intracranial abnormality.  There was mild to moderate for age cerebral white matter signal changes, most commonly due to chronic small vessel disease.  Symptomatically, she alert and conversant off Precedex.  She follows commands.  She denies any complaints.  Plan: 1. Hepatocellular carcinoma  Patient has a history of hepatitis C s/p Harvoni.  She is s/p cycle #1 Tecentriq and Avastin on 12/23/2018.   No evidence of hypophysitis, hypothyroidism  or adrenal insufficiency.    ACTH, TSH, free T4, and cortisol normal.  Abdomen MRI on 12/31/2018 revealed progression of multifocal hepatocellular carcinoma compared to 11/11/2018 imaging.  2. Diarrhea  Patient initially noted to have multiple episodes of diarrhea.  No further diarrha.  Stool was negative for C diff and GI panel.   Holding lactulose to prevent diarrhea. 3. Elevated liver function tests  12/28/2018:  AST 419, ALT 103, bilirubin 2.8, alkaline phosphatase 222.  Ammonia 44.   12/31/2018:  AST 80, ALT 51, bilirubin 1.7, alkaline  phosphatase 246.  Ammonia 14 on 12/30/2018.  01/01/2019:  AST 68, ALT 45, bilirubin 1.8, alkaline phosphatase 310.  01/02/2019:  AST 62, ALT 41, bilirubin 1.8, alkaline phosphatase  304.  Hepatitis B DNA and hepatitis C RNA was negative. 4. Presumed sepsis from unknown source  Patient was empirically treated with vancomycin and Cefepime.  She is currently on Zosyn.  Cultures negative to date.  Hypothermia appears resolved.  Appreciate ID consultation.   Doubt bacterial meningitis.   HSV encephalitis unlikely as no temporal lobe enhancement and no PLEDs.  Patient currently on Solumedrol 2 mg/kg/day.  WBC increasing over past 3 days   14,000 to 18,100 to 28,900.   Etiology possibly due to steroids  Agree with obtaining blood cultures.  Anticipate initiation of steroid taper tomorrow. 5. Acute renal insufficiency, resolved  12/14/2018:  Creatinine 3.28.  Uric acid 10.6.  12/31/2018:  Creatinine 0.92   Uric acid 0.7 on 12/29/2018.  01/01/2019:  Creatinine 0.97.  01/02/2019:  Creatinine 0.82. 6. Encephalopathy, resolving  Patient interactive and conversant today off Precedex.  Etiology remains unclear.  Suspect delay in recovery from metabolic encephalopathy.   Head CT without contrast on 12/09/2018 revealed mild periventricular small vessel disease.   Head MRI on 12/31/2018 revealed no acute abnormality.   EEG revealed slowing without seizure activity.  Appreciate neurology consult.  Doubt atezolizumab (Tecentriq) induced encephalitis (extremely rare).   See note from 01/01/2019.   Family declined LP.   No plan for high dose Solumedrol as patient's mentation clearing.    Typically used for severe or progressing symptoms or oligoclonal bands present   However, steroids 2 mg/kg has been used for encephalitis per NCCN guidelines    If felt to be the etiology, steroids are tapered over 4 weeks once symptoms resolve.  Follow-up paraneoplastic panel- sent. 7.   Code status  Altha Harm, NP involved in patient care.  Full code.   Lequita Asal, MD  01/02/2019, 11:10 AM

## 2019-01-02 NOTE — Progress Notes (Signed)
Pharmacy Electrolyte Monitoring Consult:  Pharmacy consulted to assist in monitoring and replacing electrolytes in this 73 y.o. female admitted on 12/21/2018. Patient with  Extensive protal venous thrombosis s/p immunotherapy. Patient admitted with lethargy and AKI. Patient is off vasopressors.   Labs:  Sodium (mmol/L)  Date Value  01/02/2019 150 (H)   Potassium (mmol/L)  Date Value  01/02/2019 3.5   Magnesium (mg/dL)  Date Value  01/02/2019 1.9   Phosphorus (mg/dL)  Date Value  01/02/2019 2.0 (L)   Calcium (mg/dL)  Date Value  01/02/2019 8.4 (L)   Albumin (g/dL)  Date Value  01/02/2019 2.0 (L)   Corrected Ca: 10 mg/dL  Assessment/Plan:  Patient is currently receiving D5/LR at 89mL/hr    Hypernatremia noted but beyond scope of practice for pharmacy  Will order 1 gram IV magnesium sulfate x 1  Will order 20 mmol IV potassium phosphate, which will provide 29 mEq IV potassium   Will replace for goal potassium ~ 4 mmol/L,  magnesium ~ 2 mg/dL and phosphorous > 2.5 mg/dL  Pharmacy will continue to monitor and adjust per consult.    Dallie Piles, PharmD 01/02/2019 8:15 AM

## 2019-01-02 NOTE — Progress Notes (Signed)
[facility] [place] NAME: Abigail Ayers    MR#:  [id number]  DATE OF BIRTH:  1946/05/01  SUBJECTIVE:  CHIEF COMPLAINT:   Chief Complaint  Patient presents with  . Altered Mental Status   Awake and talking. Still confused though. Sitter at bedside  REVIEW OF SYSTEMS:    Review of Systems  Unable to perform ROS: Mental status change   DRUG ALLERGIES:   Allergies  Allergen Reactions  . Lisinopril Other (See Comments)    Other reaction(s): Cough    VITALS:  Blood pressure (!) 168/96, pulse 61, temperature 98.9 F (37.2 C), temperature source Axillary, resp. rate 12, height 5' 0.5" (1.537 m), weight 76.7 kg, SpO2 99 %.  PHYSICAL EXAMINATION:   Physical Exam  GENERAL:  74 y.o.-year-old patient lying in the bed with no acute distress.  EYES: Pupils equal, round, reactive to light and accommodation. No scleral icterus. HEENT: Head atraumatic, normocephalic. Oropharynx and nasopharynx clear.  NECK:  Supple, no jugular venous distention. No thyroid enlargement, no tenderness.  LUNGS: Normal breath sounds bilaterally, no wheezing, rales, rhonchi. No use of accessory muscles of respiration.  CARDIOVASCULAR: S1, S2 normal. No murmurs, rubs, or gallops.  ABDOMEN: Soft, nontender, nondistended. Bowel sounds present.  EXTREMITIES: No cyanosis, clubbing or edema b/l.    NEUROLOGIC: Motor 5-/5 in upper and lower extremities PSYCHIATRIC: The patient is awake and confused SKIN: No obvious rash, lesion, or ulcer.   LABORATORY PANEL:   CBC Recent Labs  Lab 01/02/19 0523  WBC 28.9*  HGB 10.3*  HCT 33.4*  PLT 161   ------------------------------------------------------------------------------------------------------------------ Chemistries  Recent Labs  Lab 01/02/19 0523  NA 150*  K 3.5  CL 119*  CO2 25  GLUCOSE 178*  BUN 25*  CREATININE 0.82  CALCIUM 8.4*  MG 1.9  AST 62*  ALT 41  ALKPHOS 304*  BILITOT 1.8*    ------------------------------------------------------------------------------------------------------------------  Cardiac Enzymes No results for input(s): TROPONINI in the last 168 hours. ------------------------------------------------------------------------------------------------------------------  RADIOLOGY:  Mr Brain 81 Contrast  Result Date: 12/31/2018 CLINICAL DATA:  73 year old female with altered mental status, suspected sepsis. Liver tumor. EXAM: MRI HEAD WITHOUT CONTRAST TECHNIQUE: Multiplanar, multiecho pulse sequences of the brain and surrounding structures were obtained without intravenous contrast. COMPARISON:  Head CT 12/07/2018 and earlier. FINDINGS: Study is intermittently degraded by motion artifact despite repeated imaging attempts. Brain: No restricted diffusion to suggest acute infarction. No midline shift, mass effect, evidence of mass lesion, ventriculomegaly, extra-axial collection or acute intracranial hemorrhage. Cervicomedullary junction and pituitary are within normal limits. Scattered bilateral cerebral white matter T2 and FLAIR hyperintensity, mild to moderate for age. No cortical encephalomalacia or chronic cerebral blood products identified. No intrinsic increased T1 signal in the deep gray matter nuclei. The basal ganglia, thalami, brainstem, and cerebellum appear normal for age. Vascular: Major intracranial vascular flow voids are preserved. Skull and upper cervical spine: Negative visible cervical spine. Visible bone marrow signal appears within normal limits. Sinuses/Orbits: Negative orbits. Paranasal sinuses and mastoids are stable and well pneumatized. Other: Scalp and face soft tissues appear negative. IMPRESSION: 1.  No acute intracranial abnormality. 2. Mild to moderate for age cerebral white matter signal changes, most commonly due to chronic small vessel disease. Electronically Signed   By: Genevie Ann M.D.   On: 12/31/2018 16:09   Mr 3d Recon At  Scanner  Result Date: 12/31/2018 CLINICAL DATA:  Inpatient. 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 Recent initiation of chemotherapy.  Patient admitted with abnormal liver function tests, sepsis, altered mental status. EXAM: MRI ABDOMEN WITHOUT AND WITH CONTRAST (INCLUDING MRCP) TECHNIQUE: Multiplanar multisequence MR imaging of the abdomen was performed both before and after the administration of intravenous contrast. Heavily T2-weighted images of the biliary and pancreatic ducts were obtained, and three-dimensional MRCP images were rendered by post processing. CONTRAST:  7 cc Gadavist IV. COMPARISON:  11/11/2018 MRI abdomen. FINDINGS: Lower chest: Small dependent bilateral pleural effusions, right greater than left, new from prior MRI. Passive atelectasis at the dependent lung bases bilaterally. Hepatobiliary: There are multiple heterogeneously enhancing poorly defined masses scattered throughout the right greater than left liver lobes, most of which are new since 11/11/2018 MRI. For example, right liver dome 2.5 x 2.0 cm mass (series 19/image 18), segment 7 right liver lobe 2.7 x 2.2 cm mass (series 19/image 24), peripheral right liver lobe 3.6 x 2.0 cm mass (series 19/image 28) and segment 2 left liver lobe 1.7 x 1.1 cm mass (series 19/image 29) are all new. Segment 8 right liver lobe 2.4 x 2.1 cm mass (series 19/image 22), previously 2.4 x 2.4 cm, not appreciably changed. 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 Cholecystectomy. No biliary ductal dilatation. Common bile duct diameter 4 mm. No evidence of choledocholithiasis. Pancreas: Mild diffuse peripancreatic edema. No pancreatic mass or duct dilation. Spleen: Normal size. No mass. Adrenals/Urinary Tract: Normal adrenals. No hydronephrosis. Normal  kidneys with no renal mass. Stomach/Bowel: Normal non-distended stomach. Normal caliber visualized bowel loops. Generalized wall thickening in visualized small and large bowel. Vascular/Lymphatic: Nonaneurysmal abdominal aorta. Patent hepatic, splenic and renal veins. Patent SMV. No pathologically enlarged lymph nodes in the abdomen. Other: Small volume perihepatic ascites.  No focal fluid collection. Musculoskeletal: No aggressive appearing focal osseous lesions. IMPRESSION: 1. Several heterogeneously enhancing poorly defined liver masses scattered throughout the right greater than left liver lobes, most of which are new since 11/11/2018 MRI. Findings are compatible with progression of multifocal hepatocellular carcinoma. Previously visualized solitary segment 8 right liver mass is stable. 2. 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 3. Small volume perihepatic ascites. 4. Generalized bowel wall thickening and mild peripancreatic edema, nonspecific, potentially due to a portal hypertension and/or hypoproteinemia. 5. Small dependent bilateral pleural effusions, right greater than left, new. Electronically Signed   By: Ilona Sorrel M.D.   On: 12/31/2018 19:46   Mr Abdomen Mrcp Moise Boring Contast  Result Date: 12/31/2018 CLINICAL DATA:  Inpatient. 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 Recent initiation of chemotherapy. Patient admitted with abnormal liver function tests, sepsis, altered mental status. EXAM: MRI ABDOMEN WITHOUT AND WITH CONTRAST (INCLUDING MRCP) TECHNIQUE: Multiplanar multisequence MR imaging of the abdomen was performed both before and after the administration of intravenous contrast. Heavily T2-weighted images of the biliary and pancreatic ducts were obtained, and three-dimensional MRCP images were rendered by post processing. CONTRAST:  7 cc Gadavist IV. COMPARISON:   11/11/2018 MRI abdomen. FINDINGS: Lower chest: Small dependent bilateral pleural effusions, right greater than left, new from prior MRI. Passive atelectasis at the dependent lung bases bilaterally. Hepatobiliary: There are multiple heterogeneously enhancing poorly defined masses scattered throughout the right greater than left liver lobes, most of which are new since 11/11/2018 MRI. For example, right liver dome 2.5 x 2.0 cm mass (series 19/image 18), segment  7 right liver lobe 2.7 x 2.2 cm mass (series 19/image 24), peripheral right liver lobe 3.6 x 2.0 cm mass (series 19/image 28) and segment 2 left liver lobe 1.7 x 1.1 cm mass (series 19/image 29) are all new. Segment 8 right liver lobe 2.4 x 2.1 cm mass (series 19/image 22), previously 2.4 x 2.4 cm, not appreciably changed. 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 Cholecystectomy. No biliary ductal dilatation. Common bile duct diameter 4 mm. No evidence of choledocholithiasis. Pancreas: Mild diffuse peripancreatic edema. No pancreatic mass or duct dilation. Spleen: Normal size. No mass. Adrenals/Urinary Tract: Normal adrenals. No hydronephrosis. Normal kidneys with no renal mass. Stomach/Bowel: Normal non-distended stomach. Normal caliber visualized bowel loops. Generalized wall thickening in visualized small and large bowel. Vascular/Lymphatic: Nonaneurysmal abdominal aorta. Patent hepatic, splenic and renal veins. Patent SMV. No pathologically enlarged lymph nodes in the abdomen. Other: Small volume perihepatic ascites.  No focal fluid collection. Musculoskeletal: No aggressive appearing focal osseous lesions. IMPRESSION: 1. Several heterogeneously enhancing poorly defined liver masses scattered throughout the right greater than left liver lobes, most of which are new since 11/11/2018 MRI. Findings are compatible  with progression of multifocal hepatocellular carcinoma. Previously visualized solitary segment 8 right liver mass is stable. 2. 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 3. Small volume perihepatic ascites. 4. Generalized bowel wall thickening and mild peripancreatic edema, nonspecific, potentially due to a portal hypertension and/or hypoproteinemia. 5. Small dependent bilateral pleural effusions, right greater than left, new. Electronically Signed   By: Ilona Sorrel M.D.   On: 12/31/2018 19:46     ASSESSMENT AND PLAN:   73 year old female with a known history of hypertension, hepatocellular carcinoma presented to the emergency room for confusion  Patient is altered.   -Acute encephalopathy Etiology unclear. Neurology consulted Encephalitis? Paraneoplastic syndrome? ON steroids  -Sepsis Unknown etiology Follow-up cultures and lactic acid level IV fluids Broad-spectrum antibiotics Cultures no growth to date LP being considered by ICU team WBC elevated likely due to steroids  -Hepatocellular carcinoma Oncology follow-up Poor prognosis  -Dehydration IV fluids  -Abnormal LFT secondary to hepatocellular carcinoma  -Acute kidney injury IV fluid hydration Resolved  - Hypernatremia  -DVT prophylaxis Subcutaneous Lovenox daily  All the records are reviewed and case discussed with Care Management/Social Worker Management plans discussed with the patient, family and they are in agreement.  CODE STATUS: FULL CODE  TOTAL TIME TAKING CARE OF THIS PATIENT: 35 minutes.   POSSIBLE D/C IN 3-4 DAYS, DEPENDING ON CLINICAL CONDITION.  Leia Alf Manolito Jurewicz M.D on 01/02/2019 at 11:39 AM  Between 7am to 6pm - Pager - [phone]  After 6pm go to [email or web address] - password [email or web address] [facility]  Office  [phone]  CC: Primary care physician; Sharyne Peach, MD  Note: This dictation was  prepared with Dragon dictation along with smaller phrase technology. Any transcriptional errors that result from this process are unintentional.

## 2019-01-02 NOTE — Progress Notes (Signed)
MD asked RN to stop Precedex.  It was stopped.  Patient was in a bad mood but became gradually more alert.  She spoke with the oncologist and told her she wanted to die.  She was able to explain that she was upset, which was an improvement because she used words instead of screaming (like yesterday and earlier).  Patient let RN brush her teeth. RN then gave her the lemon swabs for dry mouth, patient continues to be NPO.  Patient initially refused labs, but is allowing the lab to attempt a blood draw now.  Phillis Knack, RN

## 2019-01-02 NOTE — Progress Notes (Signed)
ID Chart reviewed remotely, spoke to the nurse taking care of her As per her nurse pt is more alert today, answering simple questions- oriented to self, knows the presidents name. Off precedex Patient Vitals for the past 24 hrs:  BP Temp Temp src Pulse Resp SpO2 Weight  01/02/19 0800 - 98.9 F (37.2 C) Axillary - - - -  01/02/19 0635 (!) 152/83 - - 70 15 99 % -  01/02/19 0534 - - - - - - 76.7 kg  01/02/19 0500 (!) 167/87 - - 68 13 98 % -  01/02/19 0415 (!) 166/88 - - 64 12 100 % -  01/02/19 0300 136/90 - - 80 20 96 % -  01/02/19 0245 - (!) 97.3 F (36.3 C) Axillary - - - -  01/02/19 0213 (!) 164/86 - - 83 17 97 % -  01/02/19 0200 (!) 171/70 - - 64 10 98 % -  01/02/19 0110 (!) 168/77 - - 66 12 98 % -  01/02/19 0100 (!) 171/80 - - 66 12 99 % -  01/02/19 0000 (!) 161/80 - - 75 19 98 % -  01/01/19 2300 (!) 148/86 - - 70 14 98 % -  01/01/19 2200 (!) 164/96 - - 68 12 99 % -  01/01/19 2103 (!) 144/86 - - 83 (!) 23 95 % -  01/01/19 2000 (!) 136/95 - - 72 19 99 % -  01/01/19 1945 - (!) 97.1 F (36.2 C) Axillary - - - -  01/01/19 1912 (!) 157/138 - - 78 (!) 24 96 % -  01/01/19 1900 (!) 179/98 - - 76 14 97 % -  01/01/19 1800 (!) 153/78 - - 68 13 99 % -  01/01/19 1700 (!) 146/75 - - 64 11 99 % -  01/01/19 1604 133/68 98.9 F (37.2 C) Oral 67 11 97 % -  01/01/19 1600 (!) 143/128 - - 78 16 98 % -  01/01/19 1500 (!) 158/80 - - 66 12 98 % -  01/01/19 1430 (!) 145/69 - - 72 14 99 % -  01/01/19 1400 131/76 - - 68 10 99 % -  01/01/19 1300 (!) 149/82 - - 66 11 99 % -  01/01/19 1200 (!) 155/85 98 F (36.7 C) Oral 71 13 98 % -  01/01/19 1100 (!) 165/85 - - 67 12 100 % -  01/01/19 1000 (!) 150/83 - - 69 13 100 % -   CBC Latest Ref Rng & Units 01/02/2019 01/01/2019 12/31/2018  WBC 4.0 - 10.5 K/uL 28.9(H) 18.1(H) 14.0(H)  Hemoglobin 12.0 - 15.0 g/dL 10.3(L) 10.8(L) 10.3(L)  Hematocrit 36.0 - 46.0 % 33.4(L) 35.1(L) 33.7(L)  Platelets 150 - 400 K/uL 161 198 214    CMP Latest Ref Rng & Units 01/02/2019  01/01/2019 12/31/2018  Glucose 70 - 99 mg/dL 178(H) 198(H) 171(H)  BUN 8 - 23 mg/dL 25(H) 27(H) 31(H)  Creatinine 0.44 - 1.00 mg/dL 0.82 0.97 0.92  Sodium 135 - 145 mmol/L 150(H) 147(H) 144  Potassium 3.5 - 5.1 mmol/L 3.5 4.4 3.8  Chloride 98 - 111 mmol/L 119(H) 115(H) 115(H)  CO2 22 - 32 mmol/L 25 23 22   Calcium 8.9 - 10.3 mg/dL 8.4(L) 8.6(L) 8.6(L)  Total Protein 6.5 - 8.1 g/dL 5.7(L) 6.0(L) 5.7(L)  Total Bilirubin 0.3 - 1.2 mg/dL 1.8(H) 1.8(H) 1.7(H)  Alkaline Phos 38 - 126 U/L 304(H) 310(H) 246(H)  AST 15 - 41 U/L 62(H) 68(H) 80(H)  ALT 0 - 44 U/L 41 45(H) 51(H)  73 year old female with history of hepatocellular carcinoma got her first dose of anticancer medication with atezolizumab which is immune checkpoint inhibitor and bevacizumab (a vascular endothelial growth factor inhibitor ) on 12/23/2018.  Since 26 June she has had diarrhea and then has become confused.  Encephalopathy likely  metabolic as she had acute kidney injury and also increased ammonia and hepatic injury.  Other differential includes encephalitis from immune checkpoint inhibitor or an infection. MRI does not show any acute changes neither does it show any temporal lobe enhancement.  So HSV is less likely. EEG  does not show any PLEDs so HSV unlikely. She has been on antibiotics initially on Vanco and cefepime with no improvement so bacterial meningitis is less likely as well. With diarrhea which could be immune  colitis, hepatitis and kidney injury, she could have immune related Adverse event ( irAE) of tecentriq She could have immune  encephalitis from the checkpoint inhibitors though very rare  She is on solumedrol  Off precedex   AKI, transaminitis, leukocytosis and hyperlactetemia favored sepsis-like picture.  But she has had negative blood culture, urine and chest x-ray.  She was on vancomycin and cefepime and currently on Zosyn.    AKI -multifactorial -has resolved  Leucocytosis was improving but since  starting solumedrol it has gone up to 28 from 14. Will send blood culture .. also beta d glucan has been sent Would consider decreasing solumedrol if immune encephalitis is not a concern. Low threshold for starting antifungal  Hyperuricemia on admission.  Was thought to be due to tumor lysis syndrome.  It has resolved  ? 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  MRI done on 12/31/18 shows worsening tumor burden in the liver  ____________________________________________ Discussed with her nurse

## 2019-01-03 LAB — CBC
HCT: 35.1 % — ABNORMAL LOW (ref 36.0–46.0)
Hemoglobin: 11.1 g/dL — ABNORMAL LOW (ref 12.0–15.0)
MCH: 23.8 pg — ABNORMAL LOW (ref 26.0–34.0)
MCHC: 31.6 g/dL (ref 30.0–36.0)
MCV: 75.3 fL — ABNORMAL LOW (ref 80.0–100.0)
Platelets: 168 10*3/uL (ref 150–400)
RBC: 4.66 MIL/uL (ref 3.87–5.11)
RDW: 22.2 % — ABNORMAL HIGH (ref 11.5–15.5)
WBC: 31.6 10*3/uL — ABNORMAL HIGH (ref 4.0–10.5)
nRBC: 5.7 % — ABNORMAL HIGH (ref 0.0–0.2)

## 2019-01-03 LAB — RENAL FUNCTION PANEL
Albumin: 2.3 g/dL — ABNORMAL LOW (ref 3.5–5.0)
Anion gap: 11 (ref 5–15)
BUN: 21 mg/dL (ref 8–23)
CO2: 24 mmol/L (ref 22–32)
Calcium: 8.7 mg/dL — ABNORMAL LOW (ref 8.9–10.3)
Chloride: 113 mmol/L — ABNORMAL HIGH (ref 98–111)
Creatinine, Ser: 0.76 mg/dL (ref 0.44–1.00)
GFR calc Af Amer: >60 mL/min (ref >60)
GFR calc non Af Amer: >60 mL/min (ref >60)
Glucose, Bld: 149 mg/dL — ABNORMAL HIGH (ref 70–99)
Phosphorus: 2.7 mg/dL (ref 2.5–4.6)
Potassium: 3.4 mmol/L — ABNORMAL LOW (ref 3.5–5.1)
Sodium: 148 mmol/L — ABNORMAL HIGH (ref 135–145)

## 2019-01-03 LAB — PROCALCITONIN: Procalcitonin: 0.37 ng/mL

## 2019-01-03 LAB — GLUCOSE, CAPILLARY
Glucose-Capillary: 136 mg/dL — ABNORMAL HIGH (ref 70–99)
Glucose-Capillary: 151 mg/dL — ABNORMAL HIGH (ref 70–99)

## 2019-01-03 LAB — MAGNESIUM: Magnesium: 2 mg/dL (ref 1.7–2.4)

## 2019-01-03 MED ORDER — POTASSIUM CHLORIDE CRYS ER 20 MEQ PO TBCR
40.0000 meq | EXTENDED_RELEASE_TABLET | Freq: Once | ORAL | Status: AC
  Administered 2019-01-03: 09:00:00 40 meq via ORAL
  Filled 2019-01-03: qty 2

## 2019-01-03 MED ORDER — TRAMADOL HCL 50 MG PO TABS
50.0000 mg | ORAL_TABLET | Freq: Four times a day (QID) | ORAL | Status: DC | PRN
  Administered 2019-01-04: 50 mg via ORAL
  Filled 2019-01-03: qty 1

## 2019-01-03 MED ORDER — METOPROLOL TARTRATE 5 MG/5ML IV SOLN
2.5000 mg | Freq: Once | INTRAVENOUS | Status: AC
  Administered 2019-01-03: 2.5 mg via INTRAVENOUS
  Filled 2019-01-03: qty 5

## 2019-01-03 MED ORDER — METHYLPREDNISOLONE SODIUM SUCC 40 MG IJ SOLR
40.0000 mg | Freq: Two times a day (BID) | INTRAMUSCULAR | Status: DC
  Administered 2019-01-03: 21:00:00 40 mg via INTRAVENOUS
  Filled 2019-01-03: qty 1

## 2019-01-03 MED ORDER — ACETAMINOPHEN 325 MG PO TABS: 650.0000 mg | ORAL_TABLET | Freq: Four times a day (QID) | ORAL | Status: DC | PRN

## 2019-01-03 NOTE — Progress Notes (Signed)
Date of Admission:  12/25/2018      Subjective: Says she is feeling better weak  Medications:  . chlorhexidine  15 mL Mouth Rinse BID  . mouth rinse  15 mL Mouth Rinse q12n4p  . methylPREDNISolone (SOLU-MEDROL) injection  40 mg Intravenous Q12H  . pantoprazole (PROTONIX) IV  40 mg Intravenous Q12H  . potassium chloride  40 mEq Oral Once  . sodium chloride flush  10-40 mL Intracatheter Q12H    Objective: Vital signs in last 24 hours: Temp:  [97.9 F (36.6 C)-98.4 F (36.9 C)] 98.2 F (36.8 C) (07/06 0408) Pulse Rate:  [61-127] 123 (07/06 0521) Resp:  [12-20] 16 (07/06 0521) BP: (122-168)/(88-96) 158/89 (07/06 0521) SpO2:  [92 %-99 %] 92 % (07/06 0521)  PHYSICAL EXAM:  General: Alert, cooperative, no distress, appears stated age.  Head: Normocephalic, without obvious abnormality, atraumatic. Eyes: Conjunctivae clear, anicteric sclerae. Pupils are equal ENT Nares normal. No drainage or sinus tenderness. Lips, mucosa, and tongue normal. No Thrush Neck: Supple, symmetrical, no adenopathy, thyroid: non tender no carotid bruit and no JVD. Back: No CVA tenderness. Lungs: Clear to auscultation bilaterally. No Wheezing or Rhonchi. No rales.b/l air entry Heart:s1s2 Abdomen: Soft, non-tender,not distended. Bowel sounds normal. No masses Extremities: atraumatic, no cyanosis. No edema. No clubbing Skin: No rashes or lesions. Or bruising Lymph: Cervical, supraclavicular normal. Neurologic: Grossly non-focal  Lab Results Recent Labs    01/01/19 0448 01/02/19 0523 01/03/19 0433  WBC 18.1* 28.9*  --   HGB 10.8* 10.3*  --   HCT 35.1* 33.4*  --   NA 147* 150* 148*  K 4.4 3.5 3.4*  CL 115* 119* 113*  CO2 23 25 24   BUN 27* 25* 21  CREATININE 0.97 0.82 0.76   Liver Panel Recent Labs    01/01/19 0448 01/02/19 0523 01/03/19 0433  PROT 6.0* 5.7*  --   ALBUMIN 2.2* 2.0* 2.3*  AST 68* 62*  --   ALT 45* 41  --   ALKPHOS 310* 304*  --   BILITOT 1.8* 1.8*  --     Sedimentation Rate No results for input(s): ESRSEDRATE in the last 72 hours. C-Reactive Protein No results for input(s): CRP in the last 72 hours.  Microbiology:  Studies/Results: No results found.   Assessment/Plan: 73 year old female with history of hepatocellular carcinoma got her first dose of anticancer medication with atezolizumab which is immune checkpoint inhibitor and bevacizumab(a vascular endothelial growth factor inhibitor)on 12/23/2018. Since 26 June she has had diarrhea and then has become confused.  Encephalopathyhas resolved likely  metabolic as she had acute kidney injury and also increased ammonia andhepatic injury. Other differential includes encephalitis from immune checkpoint inhibitor or an infection. MRI did not show any acute changes neither did it show any temporal lobe enhancement. So HSVwas unlikely. EEG  did not show any PLEDs so HSV unlikely. No evidence of bacterial meningitis  immune related Adverse event ( irAE) of tecentriq was questioned   immune  encephalitis from the checkpoint inhibitors though very rare  She is on solumedrol and is being tapered paraneoplastic panel has been sent   AKI, transaminitis, leukocytosis andhyperlactetemiafavored sepsis-like picture. But she has had negative blood culture, urine and chest x-ray. She was on vancomycin and cefepime and currently on Zosyn. Will DC it tomorrow    AKI -multifactorial -has resolved  Leucocytosis was improving but since starting solumedrol it has gone up to 28 from 14. blood culture .. beta d glucan has been sent   Hyperuricemia  on admission. has resolved  ? 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  MRI done on 12/31/18 shows worsening tumor burden in the liver ____________________________________________ Discussed the management with her and her daughter

## 2019-01-03 NOTE — TOC Initial Note (Signed)
Transition of Care [name]) - Initial/Assessment Note    Patient Details  Name: Abigail Ayers MRN: [id number] Date of Birth: 07-Feb-1946  Transition of Care [facility]) CM/SW Contact:    Abigail Ayers, Abigail Ayers, Abigail Ayers Phone Number: [phone]  01/03/2019, 1:19 PM  Clinical Narrative: PT is recommending SNF. Clinical Social Worker (CSW) met with patient and her oldest son Abigail Ayers [phone] was at bedside. Patient was alert and oriented X4 and was sitting up in the chair at bedside. CSW introduced self and explained rolf of CSW department. Per patient she lives alone in [place] and her son Abigail Ayers lives close to her. Patient reported that she has 4 adult children and Abigail Ayers is her HPOA. CSW explained SNF process and that [facility] will have to approve it. Patient is agreeable to SNF search in [name]. FL2 complete and faxed out. Per patient she goes to [name] for treatment once every 3 weeks. CSW will continue to follow and assist as needed.               Expected Discharge Plan: Skilled Nursing Facility Barriers to Discharge: Continued Medical Work up   Patient Goals and CMS Choice Patient states their goals for this hospitalization and ongoing recovery are:: To feel better.      Expected Discharge Plan and Services Expected Discharge Plan: [name] In-house Referral: Clinical Social Work Discharge Planning Services: CM Consult Post Acute Care Choice: [name] arrangements for the past 2 months: [place]                                      Prior Living Arrangements/Services Living arrangements for the past 2 months: Single Family Home Lives with:: Self Patient language and need for interpreter reviewed:: No Do you feel safe going back to the place where you live?: Yes      Need for Family Participation in Patient Care: Yes (Comment) Care giver support system in place?: Yes (comment)   Criminal Activity/Legal Involvement  Pertinent to Current Situation/Hospitalization: No - Comment as needed  Activities of Daily Living      Permission Sought/Granted Permission sought to share information with : [name] granted to share information with : Yes, Verbal Permission Granted              Emotional Assessment Appearance:: Appears stated age   Affect (typically observed): Pleasant, Calm Orientation: : Oriented to Self, Oriented to Place, Oriented to  Time, Oriented to Situation Alcohol / Substance Use: Not Applicable Psych Involvement: No (comment)  Admission diagnosis:  Dehydration [E86.0] Hyperammonemia (HCC) [E72.20] Altered mental status, unspecified altered mental status type [R41.82] Sepsis with acute renal failure, due to unspecified organism, unspecified acute renal failure type, unspecified whether septic shock present ([place]) [A41.9, R65.20, N17.9] Patient Active Problem List   Diagnosis Date Noted  . Palliative care encounter   . Sepsis ([place]) 12/06/2018  . Altered mental status   . Dehydration   . Hyperammonemia ([place])   . Elevated liver enzymes   . Hypokalemia 12/23/2018  . Hepatocellular carcinoma ([place]) 12/16/2018  . Goals of care, counseling/discussion 11/27/2018  . Chronic venous insufficiency 03/18/2018  . 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 Claudication of calf muscles ([place]) 12/01/2017  . Osteoarthritis of knee 06/17/2017  . Nipple discharge 04/29/2013  . Essential hypertension 09/17/2012   PCP:  Abigail Peach, MD Pharmacy:   [id number] [place] [place]Phone: [phone]: [phone]    Social Determinants of Health (SDOH) Interventions    Readmission Risk Interventions No flowsheet data Ayers.

## 2019-01-03 NOTE — Progress Notes (Signed)
Hematology/Oncology Progress Note [facility] Telephone:([phone] [phone]  Patient Care Team: Sharyne Peach, MD as PCP - General (Family Medicine) Bary Castilla, Forest Gleason, MD (General Surgery) Sharyne Peach, MD (Family Medicine) Clent Jacks, RN as Registered Nurse   Name of the patient: Abigail Ayers  [id number]  21-Jun-1946  Date of visit: 01/03/19   INTERVAL HISTORY-  Patient's mental status has significantly improved since yesterday.  She is sitting in the chair, about to have lunch. Her son Abigail Ayers is at bedside. She reports some intermittent generalized abdominal pain, no exacerbating or alleviating factors.  4 out of 10.  Review of systems- Review of Systems  Unable to perform ROS: Mental status change  Constitutional: Positive for fatigue.  Gastrointestinal: Positive for abdominal pain.    Allergies  Allergen Reactions   Lisinopril Other (See Comments)    Other reaction(s): Cough    Patient Active Problem List   Diagnosis Date Noted   Palliative care encounter    Sepsis ([place]) 12/16/2018   Altered mental status    Dehydration    Hyperammonemia (HCC)    Elevated liver enzymes    Hypokalemia 12/23/2018   Hepatocellular carcinoma ([place]) 12/16/2018   Goals of care, counseling/discussion 11/27/2018   Chronic venous insufficiency 03/18/2018   Varicose veins of both lower extremities with pain 12/01/2017   Claudication of calf muscles ([place]) 12/01/2017   Osteoarthritis of knee 06/17/2017   Nipple discharge 04/29/2013   Essential hypertension 09/17/2012     Past Medical History:  Diagnosis Date   Hypertension    not currently taking meds     Past Surgical History:  Procedure Laterality Date   BREAST BIOPSY Left    neg   CHOLECYSTECTOMY  1992    Social History   Socioeconomic History   Marital status: Widowed    Spouse name: Not on file   Number of children: Not on file   Years of  education: Not on file   Highest education level: Not on file  Occupational History   Not on file  Social Needs   Financial resource strain: Not on file   Food insecurity    Worry: Not on file    Inability: Not on file   Transportation needs    Medical: Not on file    Non-medical: Not on file  Tobacco Use   Smoking status: Never Smoker   Smokeless tobacco: Never Used  Substance and Sexual Activity   Alcohol use: No   Drug use: No   Sexual activity: Not on file  Lifestyle   Physical activity    Days per week: Not on file    Minutes per session: Not on file   Stress: Not on file  Relationships   Social connections    Talks on phone: Not on file    Gets together: Not on file    Attends religious service: Not on file    Active member of club or organization: Not on file    Attends meetings of clubs or organizations: Not on file    Relationship status: Not on file   Intimate partner violence    Fear of current or ex partner: Not on file    Emotionally abused: Not on file    Physically abused: Not on file    Forced sexual activity: Not on file  Other Topics Concern   Not on file  Social History Narrative   Not on file     Family History  Problem Relation Age of Onset   Alzheimer's disease Mother    Stroke Father    Breast cancer Daughter 46       [place], genetic test negative     Current Facility-Administered Medications:    acetaminophen (TYLENOL) tablet 650 mg, 650 mg, Oral, Q6H PRN, Mody, Sital, MD   chlorhexidine (PERIDEX) 0.12 % solution 15 mL, 15 mL, Mouth Rinse, BID, Tukov-Yual, Magdalene S, NP, 15 mL at 01/03/19 0905   dextrose 5 % in lactated ringers infusion, , Intravenous, Continuous, Blakeney, Dreama Saa, NP, Last Rate: 50 mL/hr at 01/03/19 0346   MEDLINE mouth rinse, 15 mL, Mouth Rinse, q12n4p, Tukov-Yual, Magdalene S, NP, 15 mL at 01/03/19 1523   methylPREDNISolone sodium succinate (SOLU-MEDROL) 40 mg/mL injection 40 mg, 40 mg,  Intravenous, Q12H, Mody, Sital, MD   ondansetron (ZOFRAN) tablet 4 mg, 4 mg, Oral, Q6H PRN **OR** ondansetron (ZOFRAN) injection 4 mg, 4 mg, Intravenous, Q6H PRN, Pyreddy, Pavan, MD, 4 mg at 01/03/19 1419   pantoprazole (PROTONIX) injection 40 mg, 40 mg, Intravenous, Q12H, Blakeney, Dreama Saa, NP, 40 mg at 01/03/19 0905   piperacillin-tazobactam (ZOSYN) IVPB 3.375 g, 3.375 g, Intravenous, Q8H, Blakeney, Dana G, NP, Last Rate: 12.5 mL/hr at 01/03/19 1424, 3.375 g at 01/03/19 1424   simethicone (MYLICON) chewable tablet 80 mg, 80 mg, Oral, Q6H PRN, Awilda Bill, NP   sodium chloride flush (NS) 0.9 % injection 10-40 mL, 10-40 mL, Intracatheter, Q12H, Sudini, Srikar, MD, 10 mL at 01/02/19 2228   sodium chloride flush (NS) 0.9 % injection 10-40 mL, 10-40 mL, Intracatheter, PRN, Sudini, Srikar, MD   traMADol (ULTRAM) tablet 50 mg, 50 mg, Oral, Q6H PRN, Bettey Costa, MD   Physical exam:  Vitals:   01/03/19 0054 01/03/19 0408 01/03/19 0521 01/03/19 1526  BP: (!) 150/92 (!) 158/89 (!) 158/89 (!) 145/86  Pulse: (!) 127 (!) 118 (!) 123 (!) 121  Resp:  16 16 20   Temp:  98.2 F (36.8 C)  97.8 F (36.6 C)  TempSrc:  Oral  Oral  SpO2: 92% 93% 92% 94%  Weight:      Height:       Physical Exam  Constitutional:  Frail appearance female sitting the chair  Cardiovascular:  tachycardia  Neurological: She is alert.  Not talkative, answer simple questions. She moves all 4 extremities.  She interacts better and able to talk.    Patient's son declines physical examination as he wants her mother to focus on lunch    CMP Latest Ref Rng & Units 01/03/2019  Glucose 70 - 99 mg/dL 149(H)  BUN 8 - 23 mg/dL 21  Creatinine 0.44 - 1.00 mg/dL 0.76  Sodium 135 - 145 mmol/L 148(H)  Potassium 3.5 - 5.1 mmol/L 3.4(L)  Chloride 98 - 111 mmol/L 113(H)  CO2 22 - 32 mmol/L 24  Calcium 8.9 - 10.3 mg/dL 8.7(L)  Total Protein 6.5 - 8.1 g/dL -  Total Bilirubin 0.3 - 1.2 mg/dL -  Alkaline Phos 38 - 126 U/L -    AST 15 - 41 U/L -  ALT 0 - 44 U/L -   CBC Latest Ref Rng & Units 01/03/2019  WBC 4.0 - 10.5 K/uL 31.6(H)  Hemoglobin 12.0 - 15.0 g/dL 11.1(L)  Hematocrit 36.0 - 46.0 % 35.1(L)  Platelets 150 - 400 K/uL 168   RADIOGRAPHIC STUDIES: I have personally reviewed the radiological images as listed and agreed with the findings in the report.   Ct Head Wo Contrast  Result Date: 12/19/2018  CLINICAL DATA:  Liver carcinoma with altered mental status/confusion EXAM: CT HEAD WITHOUT CONTRAST TECHNIQUE: Contiguous axial images were obtained from the base of the skull through the vertex without intravenous contrast. COMPARISON:  None. FINDINGS: Brain: Ventricles and sulci are within normal limits for age. There is no demonstrable intracranial mass, hemorrhage, extra-axial fluid collection, or midline shift. There is slight small vessel disease in the centra semiovale bilaterally. No acute infarct evident. Vascular: No hyperdense vessel. There are foci of calcification in each carotid siphon region. Skull: The bony calvarium appears intact. No neoplastic focus involving bony structures. Sinuses/Orbits: Visualized paranasal sinuses are clear. Visualized orbits appear symmetric bilaterally. Other: Mastoid air cells are clear. IMPRESSION: Mild periventricular small vessel disease. No acute infarct. No mass or hemorrhage. There are foci of arterial vascular calcification. Electronically Signed   By: Lowella Grip III M.D.   On: 12/03/2018 09:46   Ct Chest Wo Contrast  Result Date: 12/15/2018 CLINICAL DATA:  Hepatocellular carcinoma staging. Chest pain. Portal vein thrombosis. EXAM: CT CHEST WITHOUT CONTRAST TECHNIQUE: Multidetector CT imaging of the chest was performed following the standard protocol without IV contrast. COMPARISON:  Abdominal CT 11/05/2018.  No prior chest imaging. FINDINGS: Cardiovascular: Aortic atherosclerosis. Tortuous thoracic aorta. Borderline cardiomegaly. Multivessel coronary artery  atherosclerosis. Mediastinum/Nodes: No supraclavicular adenopathy. No mediastinal or definite hilar adenopathy, given limitations of unenhanced CT. Lungs/Pleura: Trace right pleural fluid. No suspicious pulmonary nodule or mass. Areas of bilateral major fissure thickening versus small subpleural lymph nodes. A right apical subpleural 3 mm nodule on image 29/3 is also most likely a subpleural lymph node. Upper Abdomen: Cirrhosis with heterogeneity throughout the liver, better evaluated on contrast-enhanced studies. New small volume ascites since 11/05/2018. Musculoskeletal: Thoracic spondylosis. IMPRESSION: 1.  No acute process or evidence of metastatic disease in the chest. 2. Coronary artery atherosclerosis. Aortic Atherosclerosis (ICD10-I70.0). 3. Trace right pleural fluid. 4. New small volume ascites. Electronically Signed   By: Abigail Miyamoto M.D.   On: 12/15/2018 08:51   Mr Brain Wo Contrast  Result Date: 12/31/2018 CLINICAL DATA:  73 year old female with altered mental status, suspected sepsis. Liver tumor. EXAM: MRI HEAD WITHOUT CONTRAST TECHNIQUE: Multiplanar, multiecho pulse sequences of the brain and surrounding structures were obtained without intravenous contrast. COMPARISON:  Head CT 12/07/2018 and earlier. FINDINGS: Study is intermittently degraded by motion artifact despite repeated imaging attempts. Brain: No restricted diffusion to suggest acute infarction. No midline shift, mass effect, evidence of mass lesion, ventriculomegaly, extra-axial collection or acute intracranial hemorrhage. Cervicomedullary junction and pituitary are within normal limits. Scattered bilateral cerebral white matter T2 and FLAIR hyperintensity, mild to moderate for age. No cortical encephalomalacia or chronic cerebral blood products identified. No intrinsic increased T1 signal in the deep gray matter nuclei. The basal ganglia, thalami, brainstem, and cerebellum appear normal for age. Vascular: Major intracranial vascular  flow voids are preserved. Skull and upper cervical spine: Negative visible cervical spine. Visible bone marrow signal appears within normal limits. Sinuses/Orbits: Negative orbits. Paranasal sinuses and mastoids are stable and well pneumatized. Other: Scalp and face soft tissues appear negative. IMPRESSION: 1.  No acute intracranial abnormality. 2. Mild to moderate for age cerebral white matter signal changes, most commonly due to chronic small vessel disease. Electronically Signed   By: Genevie Ann M.D.   On: 12/31/2018 16:09   Mr 3d Recon At Scanner  Result Date: 12/31/2018 CLINICAL DATA:  Inpatient. 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 Recent initiation of chemotherapy. Patient admitted with abnormal liver  function tests, sepsis, altered mental status. EXAM: MRI ABDOMEN WITHOUT AND WITH CONTRAST (INCLUDING MRCP) TECHNIQUE: Multiplanar multisequence MR imaging of the abdomen was performed both before and after the administration of intravenous contrast. Heavily T2-weighted images of the biliary and pancreatic ducts were obtained, and three-dimensional MRCP images were rendered by post processing. CONTRAST:  7 cc Gadavist IV. COMPARISON:  11/11/2018 MRI abdomen. FINDINGS: Lower chest: Small dependent bilateral pleural effusions, right greater than left, new from prior MRI. Passive atelectasis at the dependent lung bases bilaterally. Hepatobiliary: There are multiple heterogeneously enhancing poorly defined masses scattered throughout the right greater than left liver lobes, most of which are new since 11/11/2018 MRI. For example, right liver dome 2.5 x 2.0 cm mass (series 19/image 18), segment 7 right liver lobe 2.7 x 2.2 cm mass (series 19/image 24), peripheral right liver lobe 3.6 x 2.0 cm mass (series 19/image 28) and segment 2 left liver lobe 1.7 x 1.1 cm mass (series 19/image 29) are all new. Segment 8 right liver lobe 2.4 x 2.1 cm mass (series  19/image 22), previously 2.4 x 2.4 cm, not appreciably changed. 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 Cholecystectomy. No biliary ductal dilatation. Common bile duct diameter 4 mm. No evidence of choledocholithiasis. Pancreas: Mild diffuse peripancreatic edema. No pancreatic mass or duct dilation. Spleen: Normal size. No mass. Adrenals/Urinary Tract: Normal adrenals. No hydronephrosis. Normal kidneys with no renal mass. Stomach/Bowel: Normal non-distended stomach. Normal caliber visualized bowel loops. Generalized wall thickening in visualized small and large bowel. Vascular/Lymphatic: Nonaneurysmal abdominal aorta. Patent hepatic, splenic and renal veins. Patent SMV. No pathologically enlarged lymph nodes in the abdomen. Other: Small volume perihepatic ascites.  No focal fluid collection. Musculoskeletal: No aggressive appearing focal osseous lesions. IMPRESSION: 1. Several heterogeneously enhancing poorly defined liver masses scattered throughout the right greater than left liver lobes, most of which are new since 11/11/2018 MRI. Findings are compatible with progression of multifocal hepatocellular carcinoma. Previously visualized solitary segment 8 right liver mass is stable. 2. 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 3. Small volume perihepatic ascites. 4. Generalized bowel wall thickening and mild peripancreatic edema, nonspecific, potentially due to a portal hypertension and/or hypoproteinemia. 5. Small dependent bilateral pleural effusions, right greater than left, new. Electronically Signed   By: Ilona Sorrel M.D.   On: 12/31/2018 19:46   US Biopsy (liver)  Result Date: 12/06/2018 INDICATION: 73 year old female with a history liver mass EXAM: ULTRASOUND-GUIDED BIOPSY LIVER MASS  MEDICATIONS: None. ANESTHESIA/SEDATION: Moderate (conscious) sedation was employed during this procedure. A total of Versed 1.5 mg and Fentanyl 75 mcg was administered intravenously. Moderate Sedation Time: 25 minutes. The patient's level of consciousness and vital signs were monitored continuously by radiology nursing throughout the procedure under my direct supervision. FLUOROSCOPY TIME:  ULTRASOUND COMPLICATIONS: NONE PROCEDURE: Informed written consent was obtained from the patient after a thorough discussion of the procedural risks, benefits and alternatives. All questions were addressed. Maximal Sterile Barrier Technique was utilized including caps, mask, sterile gowns, sterile gloves, sterile drape, hand hygiene and skin antiseptic. A timeout was performed prior to the initiation of the procedure Ultrasound survey of the right liver lobe performed with images stored and sent to PACs. The right lower thorax/right upper abdomen was prepped with chlorhexidine in a sterile fashion, and a sterile drape was applied covering  the operative field. A sterile gown and sterile gloves were used for the procedure. Local anesthesia was provided with 1% Lidocaine. Once the patient is prepped and draped sterilely and the skin and subcutaneous tissues were generously infiltrated with 1% lidocaine, a small stab incision was made with an 11 blade scalpel. A 17 gauge introducer needle was advanced under ultrasound guidance in an intercostal location into the right liver lobe, targeting ill-defined parenchyma of the liver tissue of the right liver. The stylet was removed, and multiple separate 18 gauge core biopsy were retrieved. Samples were placed into formalin for transportation to the lab. Gel-Foam slurry was then infused with a small amount of saline for assistance with hemostasis. The needle was removed, and a final ultrasound image was performed. The patient tolerated the procedure well and remained hemodynamically stable  throughout. No complications were encountered and no significant blood loss was encounter. IMPRESSION: Status post ultrasound-guided biopsy of ill-defined mass of the right liver. Signed, Dulcy Fanny. Dellia Nims, RPVI [facility] Electronically Signed   By: Corrie Mckusick D.O.   On: 12/06/2018 10:17   Dg Chest Port 1 View  Result Date: 12/02/2018 CLINICAL DATA:  73 year old female with history of confusion and disorientation after initial doses of chemotherapy for liver cancer. EXAM: PORTABLE CHEST 1 VIEW COMPARISON:  No priors. FINDINGS: Lung volumes are low. Linear opacities in lung bases bilaterally favored to reflect areas of subsegmental atelectasis or scarring. No consolidative airspace disease. No pleural effusions. No evidence of pulmonary edema. Heart size is normal. The patient is rotated to the left on today's exam, resulting in distortion of the mediastinal contours and reduced diagnostic sensitivity and specificity for mediastinal pathology. Aortic atherosclerosis. IMPRESSION: 1. Low lung volumes with bibasilar areas of atelectasis and/or scarring (left greater than right). 2. Aortic atherosclerosis. Electronically Signed   By: Vinnie Langton M.D.   On: 12/08/2018 09:13   Mr Abdomen Mrcp Moise Boring Contast  Result Date: 12/31/2018 CLINICAL DATA:  Inpatient. 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 Recent initiation of chemotherapy. Patient admitted with abnormal liver function tests, sepsis, altered mental status. EXAM: MRI ABDOMEN WITHOUT AND WITH CONTRAST (INCLUDING MRCP) TECHNIQUE: Multiplanar multisequence MR imaging of the abdomen was performed both before and after the administration of intravenous contrast. Heavily T2-weighted images of the biliary and pancreatic ducts were obtained, and three-dimensional MRCP images were rendered by post processing. CONTRAST:  7 cc Gadavist IV.  COMPARISON:  11/11/2018 MRI abdomen. FINDINGS: Lower chest: Small dependent bilateral pleural effusions, right greater than left, new from prior MRI. Passive atelectasis at the dependent lung bases bilaterally. Hepatobiliary: There are multiple heterogeneously enhancing poorly defined masses scattered throughout the right greater than left liver lobes, most of which are new since 11/11/2018 MRI. For example, right liver dome 2.5 x 2.0 cm mass (series 19/image 18), segment 7 right liver lobe 2.7 x 2.2 cm mass (series 19/image 24), peripheral right liver lobe 3.6 x 2.0 cm mass (series 19/image 28) and segment 2 left liver lobe 1.7 x 1.1 cm mass (series 19/image 29) are all new. Segment 8 right liver lobe 2.4 x 2.1 cm mass (series 19/image 22), previously 2.4 x 2.4 cm, not appreciably changed. 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 Cholecystectomy. No biliary ductal dilatation. Common bile duct diameter 4 mm. No evidence of choledocholithiasis. Pancreas:  Mild diffuse peripancreatic edema. No pancreatic mass or duct dilation. Spleen: Normal size. No mass. Adrenals/Urinary Tract: Normal adrenals. No hydronephrosis. Normal kidneys with no renal mass. Stomach/Bowel: Normal non-distended stomach. Normal caliber visualized bowel loops. Generalized wall thickening in visualized small and large bowel. Vascular/Lymphatic: Nonaneurysmal abdominal aorta. Patent hepatic, splenic and renal veins. Patent SMV. No pathologically enlarged lymph nodes in the abdomen. Other: Small volume perihepatic ascites.  No focal fluid collection. Musculoskeletal: No aggressive appearing focal osseous lesions. IMPRESSION: 1. Several heterogeneously enhancing poorly defined liver masses scattered throughout the right greater than left liver lobes, most of which are new since 11/11/2018 MRI. Findings are  compatible with progression of multifocal hepatocellular carcinoma. Previously visualized solitary segment 8 right liver mass is stable. 2. 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 3. Small volume perihepatic ascites. 4. Generalized bowel wall thickening and mild peripancreatic edema, nonspecific, potentially due to a portal hypertension and/or hypoproteinemia. 5. Small dependent bilateral pleural effusions, right greater than left, new. Electronically Signed   By: Ilona Sorrel M.D.   On: 12/31/2018 19:46   US Abdomen Limited Ruq  Result Date: 12/18/2018 CLINICAL DATA:  Hepatocellular carcinoma with tumor thrombus shown on prior imaging. Elevated liver enzymes. EXAM: ULTRASOUND ABDOMEN LIMITED RIGHT UPPER QUADRANT COMPARISON:  11/11/2018 MRI FINDINGS: Gallbladder: Surgically absent Common bile duct: Diameter: 5 mm Liver: Heterogeneous right hepatic lobe likely due to underlying tumors and underlying tumor thrombus. There is notable thrombus in the vicinity of the right portal vein and portal vein confluence, with some reconstitution of portal venous flow or flow around the thrombus in the porta hepatis. There is some evidence of retrograde flow in parts of the portal vein before entering the liver for example on image 27 and image 28. There is also some heterogeneity in the left hepatic lobe, for example a 3.7 by 2.8 by 3.4 cm potential lesion posteriorly, and tumor involvement in the left hepatic lobe is not excluded. IMPRESSION: 1. Tumor primarily in the right hepatic lobe and possibly in the left hepatic lobe, with ill definition. 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 There is some retrograde flow in the segment of the portal vein leading towards this thrombus as shown on image 27. 2. No significant biliary dilatation is identified. Electronically Signed   By:  Van Clines M.D.   On: 12/09/2018 17:04    Assessment and plan-  Patient is a 73 y.o. female with newly diagnosed [place], just started on first cycle of Tecentriq plus Avastin currently presents with altered mental status.    #Altered mental status, etiology remains unclear.  Likely multifactorial, toxic encephalopathy, dehydration/infection. There was discussion during the past weekend regarding possibilities of immunotherapy related encephalitis. Patient family declines LP Negative EEG Paraneoplastic panel has been sent. Since patient's mental status is getting better, no plan to do high-dose steroids. Patient and family not ready to have further discussion about future plans today. I will stop by tomorrow and discuss further with patient and family.  #Presumed sepsis Initially on vancomycin and cephapirin. Source of sepsis unknown. Patient has worsening of leukocytosis, most likely secondary to steroid use. ID is on board.  Repeating blood culture. #Diarrhea, negative for C. difficile and GI panel.  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 #Hyperuricemia on admission most likely due to AKI. She did receive rasburicase x1 dose. Her electrolytes did not meeting diagnosis criteria for Tis.  #  [place] with tumor thrombus of the portal vein MRI 12/31/2018 shows worsening of tumor burden in the liver comparing to MRI that was done in May 2020 No plan for chemotherapy during admission.  She is not due for additional chemo until 01/13/2019.  Patient was on Solu-Medrol 50 mg every 8 hours starting 01/01/2019, this was tapered down to Solu-Medrol 50 mg every 12 hours starting 01/02/2019. Steroids was further decreased to Solu-Medrol 40 mg every 12 hours starting today. Slow taper to daily prednisone 40mg  and taper down 10mg  per week.    I called daughter and had a lengthy discussion with patient's daughter Abigail Ayers. I am pleased to see that her mental status has improved and most likely mental status  changes are due to metabolic encephalopathy secondary to acute illness, in the context of AKI and impaired liver function/HCC. I recommend to continue supportive care and see how she does.  Regarding to future treatments, if she is able to recover to her baseline, can consider continuing cancer treatment.  I would not rechallenge her again with Tecentriq and Avastin given the possibility of immunotherapy related colitis, or even encephalitis-latter is very rare and unlikely the case Plan switch to lenvatinib treatment in the future Discussed again with [name] that her overall prognosis is poor due to aggressive cancer. We discussed about MRI findings on 12/31/2018 which shows worsening of tumor burden.   Thank you for allowing me to participate in the care of this patient.  We spent sufficient time to discuss many aspect of care, questions were answered to patient's satisfaction. Total face to face encounter time for this patient visit was 35 min. >50% of the time was  spent in counseling and coordination of care.    Earlie Server, MD, PhD Hematology Oncology [facility] at [facility] Pager- [phone] 01/03/2019

## 2019-01-03 NOTE — Progress Notes (Signed)
Subjective: Patient sitting on chair comfortably, talking on the phone.  eeg on 7/3: This is an abnormal electroencephalogram due to general background slowing and triphasic waves.  This is seen most commonly in encephalopathic states.  Although most often seen with hepatic encephalopathies, it is not isolated to this clinical scenario.  Head ct on admission: Mild periventricular small vessel disease. No acute infarct. No mass or hemorrhage.There are foci of arterial vascular calcification.   Objective: Current vital signs: BP (!) 158/89 (BP Location: Left Arm)   Pulse (!) 123   Temp 98.2 F (36.8 C) (Oral)   Resp 16   Ht 5' 0.5" (1.537 m)   Wt 76.7 kg   SpO2 92%   BMI 32.48 kg/m  Vital signs in last 24 hours: Temp:  [97.9 F (36.6 C)-98.4 F (36.9 C)] 98.2 F (36.8 C) (07/06 0408) Pulse Rate:  [90-127] 123 (07/06 0521) Resp:  [16-20] 16 (07/06 0521) BP: (122-163)/(88-96) 158/89 (07/06 0521) SpO2:  [92 %-99 %] 92 % (07/06 0521)  Intake/Output from previous day: 07/05 0701 - 07/06 0700 In: 3 [I.V.:3] Out: 551 [Urine:550; Stool:1] Intake/Output this shift: Total I/O In: -  Out: 150 [Urine:150] Nutritional status:  Diet Order            Diet regular Room service appropriate? Yes; Fluid consistency: Thin  Diet effective now              Neurologic Exam: Alert, awake, oriented to time/place/person and knows why she is in the hospital, speech is nle CN: PERLA, EOMI, no nystagmus, face symmetrical , face sensation is nle, palate and tongue midkline No motor deficit appreciated No sensory deficit appreciated No coordination deficit appreciated DTR/gait not checked at time of examination.  Lab Results: Basic Metabolic Panel: Recent Labs  Lab 12/14/2018 1813 12/28/18 0324  12/30/18 0227 12/30/18 1555 12/31/18 0507 01/01/19 0448 01/02/19 0523 01/03/19 0433  NA  --  140   < > 144  --  144 147* 150* 148*  K  --  3.7   < > 3.0* 4.0 3.8 4.4 3.5 3.4*  CL  --  105    < > 111  --  115* 115* 119* 113*  CO2  --  21*   < > 20*  --  22 23 25 24   GLUCOSE  --  116*   < > 98  --  171* 198* 178* 149*  BUN  --  44*   < > 36*  --  31* 27* 25* 21  CREATININE  --  1.59*   < > 0.82  --  0.92 0.97 0.82 0.76  CALCIUM  --  8.6*   < > 8.6*  --  8.6* 8.6* 8.4* 8.7*  MG  --  2.5*  --  2.6*  --  2.3  --  1.9 2.0  PHOS 2.7  --   --  3.3  --  2.4*  --  2.0* 2.7   < > = values in this interval not displayed.    Liver Function Tests: Recent Labs  Lab 12/29/18 0601 12/30/18 0227 12/31/18 0507 01/01/19 0448 01/02/19 0523 01/03/19 0433  AST 185* 111* 80* 68* 62*  --   ALT 81* 64* 51* 45* 41  --   ALKPHOS 251* 227* 246* 310* 304*  --   BILITOT 3.4* 2.7* 1.7* 1.8* 1.8*  --   PROT 6.2* 6.0* 5.7* 6.0* 5.7*  --   ALBUMIN 2.5* 2.4* 2.1* 2.2* 2.0* 2.3*   No  results for input(s): LIPASE, AMYLASE in the last 168 hours. Recent Labs  Lab 12/28/18 0324 12/30/18 0954  AMMONIA 44* 14    CBC: Recent Labs  Lab 12/30/18 0227 12/31/18 0507 01/01/19 0448 01/02/19 0523 01/03/19 0832  WBC 15.9* 14.0* 18.1* 28.9* 31.6*  NEUTROABS  --  10.6* 14.5* 24.9*  --   HGB 10.6* 10.3* 10.8* 10.3* 11.1*  HCT 35.0* 33.7* 35.1* 33.4* 35.1*  MCV 77.8* 77.6* 76.6* 76.6* 75.3*  PLT 237 214 198 161 168    Cardiac Enzymes: No results for input(s): CKTOTAL, CKMB, CKMBINDEX, TROPONINI in the last 168 hours.  Lipid Panel: No results for input(s): CHOL, TRIG, HDL, CHOLHDL, VLDL, LDLCALC in the last 168 hours.  CBG: Recent Labs  Lab 01/02/19 0731 01/02/19 1327 01/02/19 1713 01/02/19 2205 01/03/19 0053  GLUCAP 152* 169* 156* 149* 151*    Microbiology: Results for orders placed or performed during the hospital encounter of 12/10/2018  Blood culture (routine x 2)     Status: None   Collection Time: 12/19/2018  9:40 AM   Specimen: Blood  Result Value Ref Range Status   Specimen Description BLOOD LEFT ANTECUBITAL  Final   Special Requests   Final    BOTTLES DRAWN AEROBIC AND ANAEROBIC  Blood Culture adequate volume   Culture   Final    NO GROWTH 5 DAYS Performed at [facility], [address] [place], [place] [address]    Report Status 01/01/2019 FINAL  Final  Blood culture (routine x 2)     Status: None   Collection Time: 12/23/2018  9:51 AM   Specimen: Blood  Result Value Ref Range Status   Specimen Description BLOOD LEFT HAND  Final   Special Requests   Final    BOTTLES DRAWN AEROBIC AND ANAEROBIC Blood Culture results may not be optimal due to an inadequate volume of blood received in culture bottles   Culture   Final    NO GROWTH 5 DAYS Performed at [facility], [address], [place], [place] [address]    Report Status 01/01/2019 FINAL  Final  SARS Coronavirus 2 (CEPHEID - Performed in [place] hospital lab), Hosp Order     Status: None   Collection Time: 12/26/2018 10:24 AM   Specimen: Nasopharyngeal Swab  Result Value Ref Range Status   SARS Coronavirus 2 NEGATIVE NEGATIVE Final    Comment: (NOTE) If result is NEGATIVE SARS-CoV-2 target nucleic acids are NOT DETECTED. The SARS-CoV-2 RNA is generally detectable in upper and lower  respiratory specimens during the acute phase of infection. The lowest  concentration of SARS-CoV-2 viral copies this assay can detect is 250  copies / mL. A negative result does not preclude SARS-CoV-2 infection  and should not be used as the sole basis for treatment or other  patient management decisions.  A negative result may occur with  improper specimen collection / handling, submission of specimen other  than nasopharyngeal swab, presence of viral mutation(s) within the  areas targeted by this assay, and inadequate number of viral copies  (<250 copies / mL). A negative result must be combined with clinical  observations, patient history, and epidemiological information. If result is POSITIVE SARS-CoV-2 target nucleic acids are DETECTED. The SARS-CoV-2 RNA is generally detectable in upper and lower   respiratory specimens dur ing the acute phase of infection.  Positive  results are indicative of active infection with SARS-CoV-2.  Clinical  correlation with patient history and other diagnostic information is  necessary  to determine patient infection status.  Positive results do  not rule out bacterial infection or co-infection with other viruses. If result is PRESUMPTIVE POSTIVE SARS-CoV-2 nucleic acids MAY BE PRESENT.   A presumptive positive result was obtained on the submitted specimen  and confirmed on repeat testing.  While 2019 novel coronavirus  (SARS-CoV-2) nucleic acids may be present in the submitted sample  additional confirmatory testing may be necessary for epidemiological  and / or clinical management purposes  to differentiate between  SARS-CoV-2 and other Sarbecovirus currently known to infect humans.  If clinically indicated additional testing with an alternate test  methodology [phone]) is advised. The SARS-CoV-2 RNA is generally  detectable in upper and lower respiratory sp ecimens during the acute  phase of infection. The expected result is Negative. Fact Sheet for Patients:  StrictlyIdeas.no Fact Sheet for Healthcare Providers: BankingDealers.co.za This test is not yet approved or cleared by the [place] FDA and has been authorized for detection and/or diagnosis of SARS-CoV-2 by FDA under an Emergency Use Authorization (EUA).  This EUA will remain in effect (meaning this test can be used) for the duration of the COVID-19 declaration under Section 564(b)(1) of the Act, 21 U.S.C. section 360bbb-3(b)(1), unless the authorization is terminated or revoked sooner. Performed at [facility], [address] [place], [place] [address]   MRSA PCR Screening     Status: None   Collection Time: 12/05/2018  3:29 PM   Specimen: Nasopharyngeal  Result Value Ref Range Status   MRSA by PCR NEGATIVE NEGATIVE  Final    Comment:        The GeneXpert MRSA Assay (FDA approved for NASAL specimens only), is one component of a comprehensive MRSA colonization surveillance program. It is not intended to diagnose MRSA infection nor to guide or monitor treatment for MRSA infections. Performed at [facility], [address] [place], [place] [address]   Urine culture     Status: None   Collection Time: 12/10/2018  3:40 PM   Specimen: Urine  Result Value Ref Range Status   Specimen Description   Final    URINE, RANDOM Performed at [facility], [address], [place], [place] [address]    Special Requests   Final    NONE Performed at [facility], [address] [place], [place] [address]    Culture   Final    NO GROWTH Performed at [facility], [place] [address] [place], [place] [address]    Report Status 12/28/2018 FINAL  Final  Gastrointestinal Panel by PCR , Stool     Status: None   Collection Time: 12/29/18  8:51 PM   Specimen: Stool  Result Value Ref Range Status   Campylobacter species NOT DETECTED NOT DETECTED Final   Plesimonas shigelloides NOT DETECTED NOT DETECTED Final   Salmonella species NOT DETECTED NOT DETECTED Final   Yersinia enterocolitica NOT DETECTED NOT DETECTED Final   Vibrio species NOT DETECTED NOT DETECTED Final   Vibrio cholerae NOT DETECTED NOT DETECTED Final   Enteroaggregative E coli (EAEC) NOT DETECTED NOT DETECTED Final   Enteropathogenic E coli (EPEC) NOT DETECTED NOT DETECTED Final   Enterotoxigenic E coli (ETEC) NOT DETECTED NOT DETECTED Final   Shiga like toxin producing E coli (STEC) NOT DETECTED NOT DETECTED Final   Shigella/Enteroinvasive E coli (EIEC) NOT DETECTED NOT DETECTED Final   Cryptosporidium NOT DETECTED NOT DETECTED Final   Cyclospora cayetanensis NOT DETECTED NOT DETECTED Final   Entamoeba  histolytica NOT DETECTED NOT DETECTED Final   Giardia lamblia NOT DETECTED NOT DETECTED Final   Adenovirus  F40/41 NOT DETECTED NOT DETECTED Final   Astrovirus NOT DETECTED NOT DETECTED Final   Norovirus GI/GII NOT DETECTED NOT DETECTED Final   Rotavirus A NOT DETECTED NOT DETECTED Final   Sapovirus (I, II, IV, and V) NOT DETECTED NOT DETECTED Final    Comment: Performed at [facility], [address] [place], [place] [address]  C difficile quick scan w PCR reflex     Status: None   Collection Time: 12/29/18  8:51 PM   Specimen: Stool  Result Value Ref Range Status   C Diff antigen NEGATIVE NEGATIVE Final   C Diff toxin NEGATIVE NEGATIVE Final   C Diff interpretation No C. difficile detected.  Final    Comment: Performed at [facility], [address] [place], [place] [address]  Culture, blood (Routine X 2) w Reflex to ID Panel     Status: None (Preliminary result)   Collection Time: 01/02/19 10:59 AM   Specimen: BLOOD  Result Value Ref Range Status   Specimen Description BLOOD LEFT ARM  Final   Special Requests   Final    BOTTLES DRAWN AEROBIC AND ANAEROBIC Blood Culture adequate volume   Culture   Final    NO GROWTH < 24 HOURS Performed at [facility], [address] [place], [place] [address]    Report Status PENDING  Incomplete  Culture, blood (Routine X 2) w Reflex to ID Panel     Status: None (Preliminary result)   Collection Time: 01/02/19 11:27 AM   Specimen: BLOOD  Result Value Ref Range Status   Specimen Description BLOOD BLOOD LEFT FOREARM  Final   Special Requests   Final    BOTTLES DRAWN AEROBIC AND ANAEROBIC Blood Culture adequate volume   Culture   Final    NO GROWTH < 24 HOURS Performed at [facility], [address] [place], [place] [address]    Report Status PENDING  Incomplete    Coagulation Studies: Recent Labs    01/02/19 0523  LABPROT 18.7*  INR 1.6*    Imaging: No results found.  Medications:  I have reviewed the patient's current medications. Scheduled: . chlorhexidine  15 mL Mouth Rinse BID  .  mouth rinse  15 mL Mouth Rinse q12n4p  . methylPREDNISolone (SOLU-MEDROL) injection  40 mg Intravenous Q12H  . pantoprazole (PROTONIX) IV  40 mg Intravenous Q12H  . sodium chloride flush  10-40 mL Intracatheter Q12H    Assessment/Plan: Patient shows significant signs of improvement.  Etiology remains unclear with metabolic encephalopathy, immune encephalitis and infection remaining on the differential.  Remains on steroids and antibiotics.  Continues to be followed by ID and oncology as well.  Neuro exam has improved since the last exam. Alert, oriented, following commands, no focal deficit. Continue neuro protective measures including normothermia, normoglycemia, correct electroytes/metabolic ablities. Will sign off at this time. Thank you for consult. Do not hesitate to call with any questions or new neurological findings.    01/03/2019  10:05 AM

## 2019-01-03 NOTE — Progress Notes (Signed)
Pharmacy Electrolyte Monitoring Consult:  Pharmacy consulted to assist in monitoring and replacing electrolytes in this 73 y.o. female admitted on 12/23/2018. Patient with  Extensive protal venous thrombosis s/p immunotherapy. Patient admitted with lethargy and AKI. Patient is off vasopressors.   Labs:  Sodium (mmol/L)  Date Value  01/03/2019 148 (H)   Potassium (mmol/L)  Date Value  01/03/2019 3.4 (L)   Magnesium (mg/dL)  Date Value  01/03/2019 2.0   Phosphorus (mg/dL)  Date Value  01/03/2019 2.7   Calcium (mg/dL)  Date Value  01/03/2019 8.7 (L)   Albumin (g/dL)  Date Value  01/03/2019 2.3 (L)   Corrected Ca: 10 mg/dL  Assessment/Plan:  Patient is currently receiving D5/LR at 47mL/hr    Hypernatremia noted but beyond scope of practice for pharmacy  Will order KCl 40 mEq PO x 1 dose.  Will replace for goal potassium ~ 4 mmol/L,  magnesium ~ 2 mg/dL and phosphorous > 2.5 mg/dL  Pharmacy will continue to monitor and adjust per consult.    Pearla Dubonnet, PharmD 01/03/2019 7:18 AM

## 2019-01-03 NOTE — Progress Notes (Signed)
Pt HR sustaining in the 130's to 140's during periods of rest and raising to 150's during activity. Pt is weak, but otherwise asymptomatic. MD messaged by secure chat @ 1536. MD paged @1613 . MD paged @1708 . Awaiting response. Will continue to monitor.

## 2019-01-03 NOTE — TOC Progression Note (Signed)
Transition of Care [facility]) - Progression Note    Patient Details  Name: Abigail Ayers MRN: [id number] Date of Birth: 14-Apr-1946  Transition of [facility]) CM/SW Contact  Clois Treanor, [name] Phone Number: [phone]  01/03/2019, 3:29 PM  Clinical Narrative: Clinical Social Worker (CSW) met with patient and her son Abigail Ayers was at bedside. CSW presented SNF bed offers to patient and son. They chose [facility]. Per [name] liaison she will start [facility] authorization today. CSW will continue to follow and assist as needed.     Expected Discharge Plan: [address] Barriers to Discharge: Continued Medical Work up  Expected Discharge Plan and Services Expected Discharge Plan: [place] In-house Referral: Clinical Social Work Discharge Planning Services: CM Consult Post Acute Care Choice: [place] arrangements for the past 2 months: Single Family Home                                       Social Determinants of Health (SDOH) Interventions    Readmission Risk Interventions No flowsheet data found.

## 2019-01-03 NOTE — NC FL2 (Signed)
[place] LEVEL OF CARE SCREENING TOOL     IDENTIFICATION  Patient Name: Abigail Ayers Birthdate: [birth year] Sex: female Admission Date (Current Location): 12/02/2018  [place] and [place] Number:  Engineering geologist and Address:  [facility], [address], [place], [place] [address]      Provider Number: [id number]  Attending Physician Name and Address:  Bettey Costa, MD  Relative Name and Phone Number:       Current Level of Care: Hospital Recommended Level of Care: [place] Prior Approval Number:    Date Approved/Denied:   PASRR Number: [id number]  Discharge Plan: SNF    Current Diagnoses: Patient Active Problem List   Diagnosis Date Noted  . Palliative care encounter   . Sepsis ([place]) 12/25/2018  . Altered mental status   . Dehydration   . Hyperammonemia ([place])   . Elevated liver enzymes   . Hypokalemia 12/23/2018  . Hepatocellular carcinoma ([place]) 12/16/2018  . Goals of care, counseling/discussion 11/27/2018  . Chronic venous insufficiency 03/18/2018  . 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 Osteoarthritis of knee 06/17/2017  . Nipple discharge 04/29/2013  . Essential hypertension 09/17/2012    Orientation RESPIRATION BLADDER Height & Weight     Self, Time, Situation, Place  O2(2 Liters Oxygen.) Continent Weight: 169 lb 1.5 oz (76.7 kg) Height:  5' 0.5" (153.7 cm)  BEHAVIORAL SYMPTOMS/MOOD NEUROLOGICAL BOWEL NUTRITION STATUS      Incontinent Diet(Diet: Regular)  AMBULATORY STATUS COMMUNICATION OF NEEDS Skin   Extensive Assist Verbally Normal                       Personal Care Assistance Level of Assistance  Bathing, Feeding, Dressing Bathing Assistance: Limited assistance Feeding assistance: Independent Dressing Assistance: Limited assistance     Functional Limitations Info  Sight, Hearing, Speech Sight  Info: Adequate Hearing Info: Adequate Speech Info: Adequate    SPECIAL CARE FACTORS FREQUENCY  PT (By licensed PT), OT (By licensed OT)(Goes to [facility] about once every 3 weeks.)     PT Frequency: 5 OT Frequency: 5            Contractures      Additional Factors Info  Code Status, Allergies Code Status Info: Full Code. Allergies Info: Lisinopril           Current Medications (01/03/2019):  This is the current hospital active medication list Current Facility-Administered Medications  Medication Dose Route Frequency Provider Last Rate Last Dose  . acetaminophen (TYLENOL) tablet 650 mg  650 mg Oral Q6H PRN Mody, Sital, MD      . chlorhexidine (PERIDEX) 0.12 % solution 15 mL  15 mL Mouth Rinse BID Tukov-Yual, Magdalene S, NP   15 mL at 01/03/19 0905  . dextrose 5 % in lactated ringers infusion   Intravenous Continuous Awilda Bill, NP 50 mL/hr at 01/03/19 0346    . MEDLINE mouth rinse  15 mL Mouth Rinse q12n4p Tukov-Yual, Magdalene S, NP   15 mL at 01/03/19 1028  . methylPREDNISolone sodium succinate (SOLU-MEDROL) 40 mg/mL injection 40 mg  40 mg Intravenous Q12H Mody, Sital, MD      . ondansetron (ZOFRAN) tablet 4 mg  4 mg Oral Q6H PRN Pyreddy, Pavan, MD       Or  . ondansetron (ZOFRAN) injection 4 mg  4 mg Intravenous Q6H PRN Saundra Shelling, MD  4 mg at 01/02/19 2222  . pantoprazole (PROTONIX) injection 40 mg  40 mg Intravenous Q12H Awilda Bill, NP   40 mg at 01/03/19 0905  . piperacillin-tazobactam (ZOSYN) IVPB 3.375 g  3.375 g Intravenous Q8H Awilda Bill, NP   Stopped at 01/03/19 0700  . simethicone (MYLICON) chewable tablet 80 mg  80 mg Oral Q6H PRN Awilda Bill, NP      . sodium chloride flush (NS) 0.9 % injection 10-40 mL  10-40 mL Intracatheter Q12H Hillary Bow, MD   10 mL at 01/02/19 2228  . sodium chloride flush (NS) 0.9 % injection 10-40 mL  10-40 mL Intracatheter PRN Sudini, Srikar, MD      . traMADol Veatrice Bourbon) tablet 50 mg  50 mg Oral  Q6H PRN Bettey Costa, MD         Discharge Medications: Please see discharge summary for a list of discharge medications.  Relevant Imaging Results:  Relevant Lab Results:   Additional Information SSN: [id number]  Abigail Ayers, [name], LCSW

## 2019-01-03 NOTE — Plan of Care (Signed)

## 2019-01-03 NOTE — Progress Notes (Signed)
[place] [facility] [place] NAME: Abigail Ayers    MR#:  [id number]  DATE OF BIRTH:  10-Aug-1945  SUBJECTIVE:  Daughters at bedside.  Patient's mental status is at baseline.  REVIEW OF SYSTEMS:    Review of Systems  Constitutional: Negative for fever, chills weight loss HENT: Negative for ear pain, nosebleeds, congestion, facial swelling, rhinorrhea, neck pain, neck stiffness and ear discharge.   Respiratory: Negative for cough, shortness of breath, wheezing  Cardiovascular: Negative for chest pain, palpitations and leg swelling.  Gastrointestinal: Negative for heartburn, abdominal pain, vomiting, diarrhea or consitpation Genitourinary: Negative for dysuria, urgency, frequency, hematuria Musculoskeletal: Negative for back pain or joint pain Neurological: Negative for dizziness, seizures, syncope, focal weakness,  numbness and headaches.  Hematological: Does not bruise/bleed easily.  Psychiatric/Behavioral: Negative for hallucinations, confusion, dysphoric mood    Tolerating Diet: yes      DRUG ALLERGIES:   Allergies  Allergen Reactions  . Lisinopril Other (See Comments)    Other reaction(s): Cough    VITALS:  Blood pressure (!) 158/89, pulse (!) 123, temperature 98.2 F (36.8 C), temperature source Oral, resp. rate 16, height 5' 0.5" (1.537 m), weight 76.7 kg, SpO2 92 %.  PHYSICAL EXAMINATION:  Constitutional: Appears well-developed and well-nourished. No distress. HENT: Normocephalic. Marland Kitchen Oropharynx is clear and moist.  Eyes: Conjunctivae and EOM are normal. PERRLA, no scleral icterus.  Neck: Normal ROM. Neck supple. No JVD. No tracheal deviation. CVS: RRR, S1/S2 +, no murmurs, no gallops, no carotid bruit.  Pulmonary: Effort and breath sounds normal, no stridor, rhonchi, wheezes, rales.  Abdominal: Soft. BS +,  no distension, tenderness, rebound or guarding.  Musculoskeletal: Normal range of motion. No edema and no tenderness.   Neuro: Alert. CN 2-12 grossly intact. No focal deficits. Skin: Skin is warm and dry. No rash noted. Psychiatric: Normal mood and affect.      LABORATORY PANEL:   CBC Recent Labs  Lab 01/03/19 0832  WBC 31.6*  HGB 11.1*  HCT 35.1*  PLT 168   ------------------------------------------------------------------------------------------------------------------  Chemistries  Recent Labs  Lab 01/02/19 0523 01/03/19 0433  NA 150* 148*  K 3.5 3.4*  CL 119* 113*  CO2 25 24  GLUCOSE 178* 149*  BUN 25* 21  CREATININE 0.82 0.76  CALCIUM 8.4* 8.7*  MG 1.9 2.0  AST 62*  --   ALT 41  --   ALKPHOS 304*  --   BILITOT 1.8*  --    ------------------------------------------------------------------------------------------------------------------  Cardiac Enzymes No results for input(s): TROPONINI in the last 168 hours. ------------------------------------------------------------------------------------------------------------------  RADIOLOGY:  No results found.   ASSESSMENT AND PLAN:   73 year old female with hepatocellular carcinoma who presented to the emergency room due to altered mental status.  1.  Acute metabolic encephalopathy: Patient has been evaluated by oncology, GI, neurology and ID.  Etiology of encephalopathy is metabolic in nature.  Encephalopathy has resolved. 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 All cultures have been negative.  Imaging has been unremarkable. Decrease steroids with plans to perhaps discontinue tomorrow along with antibiotics. We will follow-up with neurology and ID recommendations today Sepsis ruled out.  2.  Hepatocellular carcinoma: Patient will continue follow-up with oncology.  3.  Acute kidney injury in the setting of dehydration and poor p.o. intake which is improved with IV fluids  4.  Hypernatremia: Will continue current management and recheck in a.m.  5.   Hypokalemia: Replete and recheck in a.m.  6.  Elevated LFTs in the setting of hepatocellular carcinoma  Physical therapy is recommending skilled nursing facility at discharge.  Clinical social worker consult placed.  Management plans discussed with the patient and daughter and they are in agreement.  CODE STATUS: fu;;  TOTAL TIME TAKING CARE OF THIS PATIENT: 30 minutes.     POSSIBLE D/C tomorrow, DEPENDING ON CLINICAL CONDITION.   Bettey Costa M.D on 01/03/2019 at 10:54 AM  Between 7am to 6pm - Pager - [phone] After 6pm go to [email or web address] - password [email or web address] [place] [facility]  Office  [phone]  CC: Primary care physician; Sharyne Peach, MD  Note: This dictation was prepared with Dragon dictation along with smaller phrase technology. Any transcriptional errors that result from this process are unintentional.

## 2019-01-03 NOTE — Evaluation (Addendum)
Physical Therapy Evaluation Patient Details Name: Abigail Ayers MRN: [id number] DOB: 01-27-1946 Today's Date: 01/03/2019   History of Present Illness  Patient is a 73 year old female admitted with AMS. Patient has h/o hepatic cancer. Patient reports she lives alone.  Clinical Impression  Patient received in bed, reports she has abdominal pain. Agrees to try to work with PT. I left room to go get linens and returned to patient vomiting. Patient still agreed to try to get up to chair once vomiting ended. Patient required mod assist with supine to sit, mod assist with transfer from bed to recliner. Patient not able to ambulate at this time. She will benefit from continued skilled PT while here to improve strength, functional mobility and safety with mobility.        Follow Up Recommendations SNF    Equipment Recommendations  Rolling walker with 5" wheels    Recommendations for Other Services       Precautions / Restrictions Precautions Precautions: Fall Restrictions Weight Bearing Restrictions: No      Mobility  Bed Mobility Overal bed mobility: Needs Assistance Bed Mobility: Supine to Sit     Supine to sit: Mod assist     General bed mobility comments: patient required mod assist to raise trunk from bed to seated position.  Transfers Overall transfer level: Needs assistance Equipment used: 1 person hand held assist Transfers: Stand Pivot Transfers   Stand pivot transfers: Mod assist       General transfer comment: patient was able to take one step, reaching for chair to pivot around to sit. Mod assist needed.  Ambulation/Gait         Gait velocity: decreased      Stairs            Wheelchair Mobility    Modified Rankin (Stroke Patients Only)       Balance Overall balance assessment: Needs assistance Sitting-balance support: Feet supported;Single extremity supported Sitting balance-Leahy Scale: Fair     Standing balance support: Bilateral  upper extremity supported Standing balance-Leahy Scale: Poor                               Pertinent Vitals/Pain Pain Assessment: Faces Faces Pain Scale: Hurts even more Pain Location: abdomen Pain Descriptors / Indicators: Discomfort Pain Intervention(s): Monitored during session    Home Living Family/patient expects to be discharged to:: Private residence Living Arrangements: Alone Available Help at Discharge: Family Type of Home: House Home Access: Stairs to enter   Technical brewer of Steps: 3 Home Layout: One level Home Equipment: None      Prior Function Level of Independence: Independent         Comments: patient tells me she was living alone and driving prior to admission     Hand Dominance        Extremity/Trunk Assessment   Upper Extremity Assessment Upper Extremity Assessment: Generalized weakness    Lower Extremity Assessment Lower Extremity Assessment: Generalized weakness    Cervical / Trunk Assessment Cervical / Trunk Assessment: Normal  Communication   Communication: No difficulties  Cognition Arousal/Alertness: Awake/alert Behavior During Therapy: WFL for tasks assessed/performed Overall Cognitive Status: No family/caregiver present to determine baseline cognitive functioning  General Comments      Exercises     Assessment/Plan    PT Assessment Patient needs continued PT services  PT Problem List Decreased strength;Decreased mobility;Decreased safety awareness;Decreased coordination;Decreased activity tolerance;Decreased cognition;Decreased balance;Decreased knowledge of use of DME;Pain       PT Treatment Interventions DME instruction;Therapeutic activities;Gait training;Therapeutic exercise;Patient/family education;Stair training;Balance training;Functional mobility training;Neuromuscular re-education    PT Goals (Current goals can be found in the Care Plan  section)  Acute Rehab PT Goals Patient Stated Goal: no goals stated PT Goal Formulation: With patient Time For Goal Achievement: 01/08/19    Frequency  2x per week   Barriers to discharge Decreased caregiver support;Inaccessible home environment      Co-evaluation               AM-PAC PT "6 Clicks" Mobility  Outcome Measure Help needed turning from your back to your side while in a flat bed without using bedrails?: A Lot Help needed moving from lying on your back to sitting on the side of a flat bed without using bedrails?: A Lot Help needed moving to and from a bed to a chair (including a wheelchair)?: A Lot Help needed standing up from a chair using your arms (e.g., wheelchair or bedside chair)?: A Lot Help needed to walk in hospital room?: Total Help needed climbing 3-5 steps with a railing? : Total 6 Click Score: 10    End of Session Equipment Utilized During Treatment: Gait belt Activity Tolerance: Patient limited by fatigue Patient left: in chair;with call bell/phone within reach Nurse Communication: Mobility status PT Visit Diagnosis: Unsteadiness on feet (R26.81);Muscle weakness (generalized) (M62.81);Difficulty in walking, not elsewhere classified (R26.2);Pain Pain - Right/Left: (abdomen)    Time: 8657-8469 PT Time Calculation (min) (ACUTE ONLY): 13 min   Charges:   PT Evaluation $PT Eval Low Complexity: 1 Low PT Treatments $Therapeutic Activity: 8-22 mins        Janae Bonser, PT, GCS 01/03/19,10:23 AM

## 2019-01-04 ENCOUNTER — Inpatient Hospital Stay: Payer: Medicare Other

## 2019-01-04 DIAGNOSIS — K529 Noninfective gastroenteritis and colitis, unspecified: Secondary | ICD-10-CM

## 2019-01-04 DIAGNOSIS — R112 Nausea with vomiting, unspecified: Secondary | ICD-10-CM

## 2019-01-04 LAB — BASIC METABOLIC PANEL
Anion gap: 10 (ref 5–15)
BUN: 20 mg/dL (ref 8–23)
CO2: 24 mmol/L (ref 22–32)
Calcium: 8.6 mg/dL — ABNORMAL LOW (ref 8.9–10.3)
Chloride: 117 mmol/L — ABNORMAL HIGH (ref 98–111)
Creatinine, Ser: 0.73 mg/dL (ref 0.44–1.00)
GFR calc Af Amer: >60 mL/min (ref >60)
GFR calc non Af Amer: >60 mL/min (ref >60)
Glucose, Bld: 159 mg/dL — ABNORMAL HIGH (ref 70–99)
Potassium: 3.6 mmol/L (ref 3.5–5.1)
Sodium: 151 mmol/L — ABNORMAL HIGH (ref 135–145)

## 2019-01-04 LAB — HIV ANTIBODY (ROUTINE TESTING W REFLEX): HIV Screen 4th Generation wRfx: NONREACTIVE

## 2019-01-04 LAB — GLUCOSE, CAPILLARY
Glucose-Capillary: 126 mg/dL — ABNORMAL HIGH (ref 70–99)
Glucose-Capillary: 134 mg/dL — ABNORMAL HIGH (ref 70–99)
Glucose-Capillary: 148 mg/dL — ABNORMAL HIGH (ref 70–99)
Glucose-Capillary: 158 mg/dL — ABNORMAL HIGH (ref 70–99)
Glucose-Capillary: 164 mg/dL — ABNORMAL HIGH (ref 70–99)

## 2019-01-04 LAB — QUANTIFERON-TB GOLD PLUS (RQFGPL)
QuantiFERON Mitogen Value: 0.02 IU/mL
QuantiFERON Nil Value: 0.01 IU/mL
QuantiFERON TB1 Ag Value: 0.01 IU/mL
QuantiFERON TB2 Ag Value: 0.01 IU/mL

## 2019-01-04 LAB — CBC
HCT: 35.2 % — ABNORMAL LOW (ref 36.0–46.0)
Hemoglobin: 11.1 g/dL — ABNORMAL LOW (ref 12.0–15.0)
MCH: 24 pg — ABNORMAL LOW (ref 26.0–34.0)
MCHC: 31.5 g/dL (ref 30.0–36.0)
MCV: 76.2 fL — ABNORMAL LOW (ref 80.0–100.0)
Platelets: 161 10*3/uL (ref 150–400)
RBC: 4.62 MIL/uL (ref 3.87–5.11)
RDW: 22.6 % — ABNORMAL HIGH (ref 11.5–15.5)
WBC: 30 10*3/uL — ABNORMAL HIGH (ref 4.0–10.5)
nRBC: 9.2 % — ABNORMAL HIGH (ref 0.0–0.2)

## 2019-01-04 LAB — QUANTIFERON-TB GOLD PLUS: QuantiFERON-TB Gold Plus: UNDETERMINED — AB

## 2019-01-04 LAB — PROCALCITONIN: Procalcitonin: 0.31 ng/mL

## 2019-01-04 LAB — SARS CORONAVIRUS 2 BY RT PCR (HOSPITAL ORDER, PERFORMED IN ~~LOC~~ HOSPITAL LAB): SARS Coronavirus 2: NEGATIVE

## 2019-01-04 LAB — FUNGITELL, SERUM: Fungitell Result: <31 pg/mL (ref <80)

## 2019-01-04 LAB — AFP TUMOR MARKER: AFP, Serum, Tumor Marker: 24355 ng/mL — ABNORMAL HIGH (ref 0.0–8.3)

## 2019-01-04 LAB — HEPATITIS B DNA, ULTRAQUANTITATIVE, PCR: HBV DNA SERPL PCR-ACNC: UNDETERMINED IU/mL

## 2019-01-04 MED ORDER — PREDNISONE 20 MG PO TABS
20.0000 mg | ORAL_TABLET | Freq: Once | ORAL | Status: AC
  Administered 2019-01-04: 20:00:00 20 mg via ORAL
  Filled 2019-01-04: qty 1

## 2019-01-04 MED ORDER — METOPROLOL TARTRATE 50 MG PO TABS
50.0000 mg | ORAL_TABLET | Freq: Two times a day (BID) | ORAL | Status: DC
  Administered 2019-01-04 – 2019-01-05 (×2): 50 mg via ORAL
  Filled 2019-01-04 (×3): qty 1

## 2019-01-04 MED ORDER — AMLODIPINE BESYLATE 5 MG PO TABS
5.0000 mg | ORAL_TABLET | Freq: Every day | ORAL | Status: DC
  Administered 2019-01-04 – 2019-01-05 (×2): 5 mg via ORAL
  Filled 2019-01-04 (×2): qty 1

## 2019-01-04 MED ORDER — HYDRALAZINE HCL 20 MG/ML IJ SOLN: 10.0000 mg | Freq: Four times a day (QID) | INTRAMUSCULAR | Status: DC | PRN

## 2019-01-04 MED ORDER — PREDNISONE 50 MG PO TABS
50.0000 mg | ORAL_TABLET | Freq: Every day | ORAL | Status: DC
  Administered 2019-01-04: 09:00:00 50 mg via ORAL
  Filled 2019-01-04: qty 1

## 2019-01-04 MED ORDER — METOPROLOL TARTRATE 25 MG PO TABS
25.0000 mg | ORAL_TABLET | Freq: Two times a day (BID) | ORAL | Status: DC
  Administered 2019-01-04: 25 mg via ORAL
  Filled 2019-01-04: qty 1

## 2019-01-04 MED ORDER — METOPROLOL TARTRATE 25 MG PO TABS
25.0000 mg | ORAL_TABLET | Freq: Once | ORAL | Status: AC
  Administered 2019-01-04: 12:00:00 25 mg via ORAL
  Filled 2019-01-04: qty 1

## 2019-01-04 NOTE — Progress Notes (Signed)
PT Cancellation Note  Patient Details Name: Abigail Ayers MRN: [id number] DOB: [birth year]   Cancelled Treatment:    Reason Eval/Treat Not Completed: Medical issues which prohibited therapy, patient's HR and BP are outside of range to work with her safely.  Resting HR 122. BP 171/100.  Will see patient when medically stable.     Rande Roylance 01/04/2019, 12:19 PM

## 2019-01-04 NOTE — Progress Notes (Signed)
Pharmacy Electrolyte Monitoring Consult:  Pharmacy consulted to assist in monitoring and replacing electrolytes in this 73 y.o. female admitted on 12/02/2018. Patient with  Extensive protal venous thrombosis s/p immunotherapy. Patient admitted with lethargy and AKI. Patient is off vasopressors.   Labs:  Sodium (mmol/L)  Date Value  01/04/2019 151 (H)   Potassium (mmol/L)  Date Value  01/04/2019 3.6   Magnesium (mg/dL)  Date Value  01/03/2019 2.0   Phosphorus (mg/dL)  Date Value  01/03/2019 2.7   Calcium (mg/dL)  Date Value  01/04/2019 8.6 (L)   Albumin (g/dL)  Date Value  01/03/2019 2.3 (L)   Corrected Ca: 10 mg/dL  Assessment/Plan:  Patient is currently receiving D5/LR at 73mL/hr    Hypernatremia noted but beyond scope of practice for pharmacy  Electrolytes are currently WNL. Will defer further supplementation.  Will replace for goal potassium ~ 4 mmol/L,  magnesium ~ 2 mg/dL and phosphorous > 2.5 mg/dL  Pharmacy will continue to monitor and adjust per consult.    Pearla Dubonnet, PharmD 01/04/2019 7:27 AM

## 2019-01-04 NOTE — Progress Notes (Signed)
[facility] [place] NAME: Abigail Ayers    MR#:  [id number]  DATE OF BIRTH:  11/02/1945  SUBJECTIVE:   Patient's mental status has improved.  Patient had nausea this morning.  Blood pressure was high.  Heart rate was high last night.  Patient denies shortness of breath.  REVIEW OF SYSTEMS:    Review of Systems  Constitutional: Negative for fever, chills weight loss HENT: Negative for ear pain, nosebleeds, congestion, facial swelling, rhinorrhea, neck pain, neck stiffness and ear discharge.   Respiratory: Negative for cough, shortness of breath, wheezing  Cardiovascular: Negative for chest pain, palpitations and leg swelling.  Gastrointestinal: Negative for heartburn, abdominal pain, , diarrhea or consitpation Genitourinary: Negative for dysuria, urgency, frequency, hematuria Musculoskeletal: Negative for back pain or joint pain Neurological: Negative for dizziness, seizures, syncope, focal weakness,  numbness and headaches.  Hematological: Does not bruise/bleed easily.  Psychiatric/Behavioral: Negative for hallucinations, confusion, dysphoric mood    Tolerating Diet: Had nausea this morning     DRUG ALLERGIES:   Allergies  Allergen Reactions  . Lisinopril Other (See Comments)    Other reaction(s): Cough    VITALS:  Blood pressure (!) 179/105, pulse (!) 126, temperature 98.2 F (36.8 C), temperature source Oral, resp. rate 18, height 5' 0.5" (1.537 m), weight 76.7 kg, SpO2 93 %.  PHYSICAL EXAMINATION:  Constitutional: Appears well-developed and well-nourished. No distress. HENT: Normocephalic. Marland Kitchen Oropharynx is clear and moist.  Eyes: Conjunctivae and EOM are normal. PERRLA, no scleral icterus.  Neck: Normal ROM. Neck supple. No JVD. No tracheal deviation. CVS: RRR, S1/S2 +, no murmurs, no gallops, no carotid bruit.  Pulmonary: Effort and breath sounds normal, no stridor, rhonchi, wheezes, rales.  Abdominal: Soft. BS +,  no  distension, tenderness, rebound or guarding.  Musculoskeletal: Normal range of motion. No edema and no tenderness.  Neuro: Alert. CN 2-12 grossly intact. No focal deficits. Skin: Skin is warm and dry. No rash noted. Psychiatric: Normal mood and affect.      LABORATORY PANEL:   CBC Recent Labs  Lab 01/04/19 0406  WBC 30.0*  HGB 11.1*  HCT 35.2*  PLT 161   ------------------------------------------------------------------------------------------------------------------  Chemistries  Recent Labs  Lab 01/02/19 0523 01/03/19 0433 01/04/19 0406  NA 150* 148* 151*  K 3.5 3.4* 3.6  CL 119* 113* 117*  CO2 25 24 24   GLUCOSE 178* 149* 159*  BUN 25* 21 20  CREATININE 0.82 0.76 0.73  CALCIUM 8.4* 8.7* 8.6*  MG 1.9 2.0  --   AST 62*  --   --   ALT 41  --   --   ALKPHOS 304*  --   --   BILITOT 1.8*  --   --    ------------------------------------------------------------------------------------------------------------------  Cardiac Enzymes No results for input(s): TROPONINI in the last 168 hours. ------------------------------------------------------------------------------------------------------------------  RADIOLOGY:  No results found.   ASSESSMENT AND PLAN:   73 year old female with hepatocellular carcinoma who presented to the emergency room due to altered mental status.  1.  Acute metabolic encephalopathy: Patient has been evaluated by oncology, GI, neurology and ID.  Etiology of encephalopathy is metabolic in nature.  Encephalopathy has resolved. 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 All cultures have been negative.  Imaging has been unremarkable. Steroids have been changed to oral and will be tapered.  Antibiotics have been discontinued as per ID recommendations.  Sepsis ruled out White blood cell count slowly trending downward  2.  Hepatocellular carcinoma: Patient will continue follow-up  with oncology.  3.  Acute kidney injury in the setting of dehydration and poor p.o. intake which is improved with IV fluids  4.  Hypernatremia: Continue D5 and repeat BMP in a.m.  5.  Hypokalemia: Replete and recheck in a.m.  6.  Elevated LFTs in the setting of hepatocellular carcinoma 7.  Hypokalemia: This will be repleted  8.  Hypertension with tachycardia: Metoprolol has been started in addition to Norvasc 9.  Nausea: Check KUB  Physical therapy is recommending skilled nursing facility at discharge.   COVID testing needed before discharge to skilled nursing facility   Management plans discussed with the patient and daughter and they are in agreement.  CODE STATUS: Full TOTAL TIME TAKING CARE OF THIS PATIENT: 29 minutes.     POSSIBLE D/C tomorrow, DEPENDING ON CLINICAL CONDITION.   Bettey Costa M.D on 01/04/2019 at 11:11 AM  Between 7am to 6pm - Pager - [phone] After 6pm go to [email or web address] - password [email or web address] [facility]  Office  [phone]  CC: Primary care physician; Sharyne Peach, MD  Note: This dictation was prepared with Dragon dictation along with smaller phrase technology. Any transcriptional errors that result from this process are unintentional.

## 2019-01-04 NOTE — TOC Progression Note (Signed)
Transition of Care [facility]) - Progression Note    Patient Details  Name: Abigail Ayers MRN: [id number] Date of Birth: 23-Aug-1945  Transition of [facility] CM/SW Contact  Calen Posch, [name] Phone Number: [phone]  01/04/2019, 3:18 PM  Clinical Narrative:  Covid test is negative today. Per MD patient is not ready for D/C today. [facility] liaison is aware of above. Patient and her daughter [name] are aware of above.     Expected Discharge Plan: [place] Barriers to Discharge: Continued Medical Work up  Expected Discharge Plan and Services Expected Discharge Plan: [place] In-house Referral: Clinical Social Work Discharge Planning Services: CM Consult Post Acute Care Choice: [place] arrangements for the past 2 months: Single Family Home                                       Social Determinants of Health (SDOH) Interventions    Readmission Risk Interventions No flowsheet data found.

## 2019-01-04 NOTE — Progress Notes (Signed)
Hematology/Oncology Progress Note [facility] [phone] [phone]  Patient Care Team: Sharyne Peach, MD as PCP - General (Family Medicine) Bary Castilla, Forest Gleason, MD (General Surgery) Sharyne Peach, MD (Family Medicine) Clent Jacks, RN as Registered Nurse   Name of the patient: Abigail Ayers  [id number]  May 03, 1946  Date of visit: 01/04/19   INTERVAL HISTORY-  Patient is sitting the bed with no acute distress. Daughter [name] at bedside. Patient had an episode of vomiting this morning.  Per RN, unclear if she had vomited her morning medications. Currently no nausea. Continue to have some vague generalized abdominal pain.  No exacerbating or alleviating factors. Nurse reports that she has greenish Mushy bowel movement this morning.  Review of systems- Review of Systems  Constitutional: Positive for fatigue.  Eyes: Positive for icterus.  Respiratory: Negative for shortness of breath.   Gastrointestinal: Positive for abdominal pain and nausea.  Skin: Negative for rash.  Neurological: Negative for headaches.    Allergies  Allergen Reactions   Lisinopril Other (See Comments)    Other reaction(s): Cough    Patient Active Problem List   Diagnosis Date Noted   Palliative care encounter    Sepsis ([place]) 12/16/2018   Altered mental status    Dehydration    Hyperammonemia (HCC)    Elevated liver enzymes    Hypokalemia 12/23/2018   Hepatocellular carcinoma ([place]) 12/16/2018   Goals of care, counseling/discussion 11/27/2018   Chronic venous insufficiency 03/18/2018   Varicose veins of both lower extremities with pain 12/01/2017   Claudication of calf muscles ([place]) 12/01/2017   Osteoarthritis of knee 06/17/2017   Nipple discharge 04/29/2013   Essential hypertension 09/17/2012     Past Medical History:  Diagnosis Date   Hypertension    not currently taking meds     Past Surgical History:    Procedure Laterality Date   BREAST BIOPSY Left    neg   CHOLECYSTECTOMY  1992    Social History   Socioeconomic History   Marital status: Widowed    Spouse name: Not on file   Number of children: Not on file   Years of education: Not on file   Highest education level: Not on file  Occupational History   Not on file  Social Needs   Financial resource strain: Not on file   Food insecurity    Worry: Not on file    Inability: Not on file   Transportation needs    Medical: Not on file    Non-medical: Not on file  Tobacco Use   Smoking status: Never Smoker   Smokeless tobacco: Never Used  Substance and Sexual Activity   Alcohol use: No   Drug use: No   Sexual activity: Not on file  Lifestyle   Physical activity    Days per week: Not on file    Minutes per session: Not on file   Stress: Not on file  Relationships   Social connections    Talks on phone: Not on file    Gets together: Not on file    Attends religious service: Not on file    Active member of club or organization: Not on file    Attends meetings of clubs or organizations: Not on file    Relationship status: Not on file   Intimate partner violence    Fear of current or ex partner: Not on file    Emotionally abused: Not on file    Physically abused:  Not on file    Forced sexual activity: Not on file  Other Topics Concern   Not on file  Social History Narrative   Not on file     Family History  Problem Relation Age of Onset   Alzheimer's disease Mother    Stroke Father    Breast cancer Daughter 25       [place], genetic test negative     Current Facility-Administered Medications:    acetaminophen (TYLENOL) tablet 650 mg, 650 mg, Oral, Q6H PRN, Mody, Sital, MD   amLODipine (NORVASC) tablet 5 mg, 5 mg, Oral, Daily, Mody, Sital, MD, 5 mg at 01/04/19 1139   chlorhexidine (PERIDEX) 0.12 % solution 15 mL, 15 mL, Mouth Rinse, BID, Tukov-Yual, Magdalene S, NP, 15 mL at 01/04/19  0928   dextrose 5 % in lactated ringers infusion, , Intravenous, Continuous, Mody, Sital, MD, Last Rate: 75 mL/hr at 01/04/19 1530   hydrALAZINE (APRESOLINE) injection 10 mg, 10 mg, Intravenous, Q6H PRN, Mody, Sital, MD   MEDLINE mouth rinse, 15 mL, Mouth Rinse, q12n4p, Tukov-Yual, Magdalene S, NP, 15 mL at 01/03/19 1523   metoprolol tartrate (LOPRESSOR) tablet 50 mg, 50 mg, Oral, BID, Mody, Sital, MD   ondansetron (ZOFRAN) tablet 4 mg, 4 mg, Oral, Q6H PRN **OR** ondansetron (ZOFRAN) injection 4 mg, 4 mg, Intravenous, Q6H PRN, Pyreddy, Pavan, MD, 4 mg at 01/03/19 1419   pantoprazole (PROTONIX) injection 40 mg, 40 mg, Intravenous, Q12H, Blakeney, Dana G, NP, 40 mg at 01/04/19 1000   predniSONE (DELTASONE) tablet 50 mg, 50 mg, Oral, Q breakfast, Mody, Sital, MD, 50 mg at 01/04/19 0929   simethicone (MYLICON) chewable tablet 80 mg, 80 mg, Oral, Q6H PRN, Awilda Bill, NP, 80 mg at 01/04/19 1550   sodium chloride flush (NS) 0.9 % injection 10-40 mL, 10-40 mL, Intracatheter, Q12H, Sudini, Srikar, MD, 10 mL at 01/03/19 2039   sodium chloride flush (NS) 0.9 % injection 10-40 mL, 10-40 mL, Intracatheter, PRN, Sudini, Srikar, MD   traMADol (ULTRAM) tablet 50 mg, 50 mg, Oral, Q6H PRN, Benjie Karvonen, Sital, MD, 50 mg at 01/04/19 1527   Physical exam:  Vitals:   01/04/19 0922 01/04/19 0951 01/04/19 1135 01/04/19 1436  BP: (!) 179/105  (!) 171/100 (!) 173/95  Pulse: (!) 126  (!) 122 (!) 116  Resp:    20  Temp:  98.2 F (36.8 C)  (!) 97.4 F (36.3 C)  TempSrc:  Oral  Oral  SpO2:    94%  Weight:      Height:       Physical Exam  Constitutional: She is oriented to person, place, and time.  Frail appearance female sitting the chair  HENT:  Head: Normocephalic and atraumatic.  Neck: Neck supple.  Cardiovascular:  tachycardia  Pulmonary/Chest: Effort normal.  Abdominal: Soft. Bowel sounds are normal. She exhibits distension.  Musculoskeletal: Normal range of motion.  Neurological: She is  alert and oriented to person, place, and time.  Not talkative, answer simple questions. She moves all 4 extremities.  She interacts better and able to talk.    Skin: Skin is dry.  Psychiatric: Affect normal.      CMP Latest Ref Rng & Units 01/04/2019  Glucose 70 - 99 mg/dL 159(H)  BUN 8 - 23 mg/dL 20  Creatinine 0.44 - 1.00 mg/dL 0.73  Sodium 135 - 145 mmol/L 151(H)  Potassium 3.5 - 5.1 mmol/L 3.6  Chloride 98 - 111 mmol/L 117(H)  CO2 22 - 32 mmol/L 24  Calcium 8.9 -  10.3 mg/dL 8.6(L)  Total Protein 6.5 - 8.1 g/dL -  Total Bilirubin 0.3 - 1.2 mg/dL -  Alkaline Phos 38 - 126 U/L -  AST 15 - 41 U/L -  ALT 0 - 44 U/L -   CBC Latest Ref Rng & Units 01/04/2019  WBC 4.0 - 10.5 K/uL 30.0(H)  Hemoglobin 12.0 - 15.0 g/dL 11.1(L)  Hematocrit 36.0 - 46.0 % 35.2(L)  Platelets 150 - 400 K/uL 161   RADIOGRAPHIC STUDIES: I have personally reviewed the radiological images as listed and agreed with the findings in the report.   Dg Abd 1 View  Result Date: 01/04/2019 CLINICAL DATA:  Nausea, vomiting, abdominal pain EXAM: ABDOMEN - 1 VIEW COMPARISON:  CT abdomen 11/05/2018 FINDINGS: There is mild gaseous distension of the small bowel. There is no bowel dilatation to suggest obstruction. There is no evidence of pneumoperitoneum, portal venous gas or pneumatosis. There are no pathologic calcifications along the expected course of the ureters. The osseous structures are unremarkable. IMPRESSION: No acute abdominal abnormality. Electronically Signed   By: Kathreen Devoid   On: 01/04/2019 11:53   Ct Head Wo Contrast  Result Date: 12/02/2018 CLINICAL DATA:  Liver carcinoma with altered mental status/confusion EXAM: CT HEAD WITHOUT CONTRAST TECHNIQUE: Contiguous axial images were obtained from the base of the skull through the vertex without intravenous contrast. COMPARISON:  None. FINDINGS: Brain: Ventricles and sulci are within normal limits for age. There is no demonstrable intracranial mass, hemorrhage,  extra-axial fluid collection, or midline shift. There is slight small vessel disease in the centra semiovale bilaterally. No acute infarct evident. Vascular: No hyperdense vessel. There are foci of calcification in each carotid siphon region. Skull: The bony calvarium appears intact. No neoplastic focus involving bony structures. Sinuses/Orbits: Visualized paranasal sinuses are clear. Visualized orbits appear symmetric bilaterally. Other: Mastoid air cells are clear. IMPRESSION: Mild periventricular small vessel disease. No acute infarct. No mass or hemorrhage. There are foci of arterial vascular calcification. Electronically Signed   By: Lowella Grip III M.D.   On: 12/01/2018 09:46   Ct Chest Wo Contrast  Result Date: 12/15/2018 CLINICAL DATA:  Hepatocellular carcinoma staging. Chest pain. Portal vein thrombosis. EXAM: CT CHEST WITHOUT CONTRAST TECHNIQUE: Multidetector CT imaging of the chest was performed following the standard protocol without IV contrast. COMPARISON:  Abdominal CT 11/05/2018.  No prior chest imaging. FINDINGS: Cardiovascular: Aortic atherosclerosis. Tortuous thoracic aorta. Borderline cardiomegaly. Multivessel coronary artery atherosclerosis. Mediastinum/Nodes: No supraclavicular adenopathy. No mediastinal or definite hilar adenopathy, given limitations of unenhanced CT. Lungs/Pleura: Trace right pleural fluid. No suspicious pulmonary nodule or mass. Areas of bilateral major fissure thickening versus small subpleural lymph nodes. A right apical subpleural 3 mm nodule on image 29/3 is also most likely a subpleural lymph node. Upper Abdomen: Cirrhosis with heterogeneity throughout the liver, better evaluated on contrast-enhanced studies. New small volume ascites since 11/05/2018. Musculoskeletal: Thoracic spondylosis. IMPRESSION: 1.  No acute process or evidence of metastatic disease in the chest. 2. Coronary artery atherosclerosis. Aortic Atherosclerosis (ICD10-I70.0). 3. Trace right  pleural fluid. 4. New small volume ascites. Electronically Signed   By: Abigail Miyamoto M.D.   On: 12/15/2018 08:51   Mr Brain Wo Contrast  Result Date: 12/31/2018 CLINICAL DATA:  73 year old female with altered mental status, suspected sepsis. Liver tumor. EXAM: MRI HEAD WITHOUT CONTRAST TECHNIQUE: Multiplanar, multiecho pulse sequences of the brain and surrounding structures were obtained without intravenous contrast. COMPARISON:  Head CT 12/07/2018 and earlier. FINDINGS: Study is intermittently degraded by motion artifact  despite repeated imaging attempts. Brain: No restricted diffusion to suggest acute infarction. No midline shift, mass effect, evidence of mass lesion, ventriculomegaly, extra-axial collection or acute intracranial hemorrhage. Cervicomedullary junction and pituitary are within normal limits. Scattered bilateral cerebral white matter T2 and FLAIR hyperintensity, mild to moderate for age. No cortical encephalomalacia or chronic cerebral blood products identified. No intrinsic increased T1 signal in the deep gray matter nuclei. The basal ganglia, thalami, brainstem, and cerebellum appear normal for age. Vascular: Major intracranial vascular flow voids are preserved. Skull and upper cervical spine: Negative visible cervical spine. Visible bone marrow signal appears within normal limits. Sinuses/Orbits: Negative orbits. Paranasal sinuses and mastoids are stable and well pneumatized. Other: Scalp and face soft tissues appear negative. IMPRESSION: 1.  No acute intracranial abnormality. 2. Mild to moderate for age cerebral white matter signal changes, most commonly due to chronic small vessel disease. Electronically Signed   By: Genevie Ann M.D.   On: 12/31/2018 16:09   Mr 3d Recon At Scanner  Result Date: 12/31/2018 CLINICAL DATA:  Inpatient. 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 Recent initiation of chemotherapy. Patient admitted with  abnormal liver function tests, sepsis, altered mental status. EXAM: MRI ABDOMEN WITHOUT AND WITH CONTRAST (INCLUDING MRCP) TECHNIQUE: Multiplanar multisequence MR imaging of the abdomen was performed both before and after the administration of intravenous contrast. Heavily T2-weighted images of the biliary and pancreatic ducts were obtained, and three-dimensional MRCP images were rendered by post processing. CONTRAST:  7 cc Gadavist IV. COMPARISON:  11/11/2018 MRI abdomen. FINDINGS: Lower chest: Small dependent bilateral pleural effusions, right greater than left, new from prior MRI. Passive atelectasis at the dependent lung bases bilaterally. Hepatobiliary: There are multiple heterogeneously enhancing poorly defined masses scattered throughout the right greater than left liver lobes, most of which are new since 11/11/2018 MRI. For example, right liver dome 2.5 x 2.0 cm mass (series 19/image 18), segment 7 right liver lobe 2.7 x 2.2 cm mass (series 19/image 24), peripheral right liver lobe 3.6 x 2.0 cm mass (series 19/image 28) and segment 2 left liver lobe 1.7 x 1.1 cm mass (series 19/image 29) are all new. Segment 8 right liver lobe 2.4 x 2.1 cm mass (series 19/image 22), previously 2.4 x 2.4 cm, not appreciably changed. 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 Cholecystectomy. No biliary ductal dilatation. Common bile duct diameter 4 mm. No evidence of choledocholithiasis. Pancreas: Mild diffuse peripancreatic edema. No pancreatic mass or duct dilation. Spleen: Normal size. No mass. Adrenals/Urinary Tract: Normal adrenals. No hydronephrosis. Normal kidneys with no renal mass. Stomach/Bowel: Normal non-distended stomach. Normal caliber visualized bowel loops. Generalized wall thickening in visualized small and large bowel. Vascular/Lymphatic: Nonaneurysmal abdominal  aorta. Patent hepatic, splenic and renal veins. Patent SMV. No pathologically enlarged lymph nodes in the abdomen. Other: Small volume perihepatic ascites.  No focal fluid collection. Musculoskeletal: No aggressive appearing focal osseous lesions. IMPRESSION: 1. Several heterogeneously enhancing poorly defined liver masses scattered throughout the right greater than left liver lobes, most of which are new since 11/11/2018 MRI. Findings are compatible with progression of multifocal hepatocellular carcinoma. Previously visualized solitary segment 8 right liver mass is stable. 2. 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 3. Small volume perihepatic ascites. 4. Generalized bowel wall thickening and mild peripancreatic edema, nonspecific, potentially due  to a portal hypertension and/or hypoproteinemia. 5. Small dependent bilateral pleural effusions, right greater than left, new. Electronically Signed   By: Ilona Sorrel M.D.   On: 12/31/2018 19:46   US Biopsy (liver)  Result Date: 12/06/2018 INDICATION: 73 year old female with a history liver mass EXAM: ULTRASOUND-GUIDED BIOPSY LIVER MASS MEDICATIONS: None. ANESTHESIA/SEDATION: Moderate (conscious) sedation was employed during this procedure. A total of Versed 1.5 mg and Fentanyl 75 mcg was administered intravenously. Moderate Sedation Time: 25 minutes. The patient's level of consciousness and vital signs were monitored continuously by radiology nursing throughout the procedure under my direct supervision. FLUOROSCOPY TIME:  ULTRASOUND COMPLICATIONS: NONE PROCEDURE: Informed written consent was obtained from the patient after a thorough discussion of the procedural risks, benefits and alternatives. All questions were addressed. Maximal Sterile Barrier Technique was utilized including caps, mask, sterile gowns, sterile gloves, sterile drape, hand hygiene and skin antiseptic. A timeout was performed  prior to the initiation of the procedure Ultrasound survey of the right liver lobe performed with images stored and sent to PACs. The right lower thorax/right upper abdomen was prepped with chlorhexidine in a sterile fashion, and a sterile drape was applied covering the operative field. A sterile gown and sterile gloves were used for the procedure. Local anesthesia was provided with 1% Lidocaine. Once the patient is prepped and draped sterilely and the skin and subcutaneous tissues were generously infiltrated with 1% lidocaine, a small stab incision was made with an 11 blade scalpel. A 17 gauge introducer needle was advanced under ultrasound guidance in an intercostal location into the right liver lobe, targeting ill-defined parenchyma of the liver tissue of the right liver. The stylet was removed, and multiple separate 18 gauge core biopsy were retrieved. Samples were placed into formalin for transportation to the lab. Gel-Foam slurry was then infused with a small amount of saline for assistance with hemostasis. The needle was removed, and a final ultrasound image was performed. The patient tolerated the procedure well and remained hemodynamically stable throughout. No complications were encountered and no significant blood loss was encounter. IMPRESSION: Status post ultrasound-guided biopsy of ill-defined mass of the right liver. Signed, Dulcy Fanny. Dellia Nims, RPVI [facility] Electronically Signed   By: Corrie Mckusick D.O.   On: 12/06/2018 10:17   Dg Chest Port 1 View  Result Date: 12/05/2018 CLINICAL DATA:  73 year old female with history of confusion and disorientation after initial doses of chemotherapy for liver cancer. EXAM: PORTABLE CHEST 1 VIEW COMPARISON:  No priors. FINDINGS: Lung volumes are low. Linear opacities in lung bases bilaterally favored to reflect areas of subsegmental atelectasis or scarring. No consolidative airspace disease. No  pleural effusions. No evidence of pulmonary edema. Heart size is normal. The patient is rotated to the left on today's exam, resulting in distortion of the mediastinal contours and reduced diagnostic sensitivity and specificity for mediastinal pathology. Aortic atherosclerosis. IMPRESSION: 1. Low lung volumes with bibasilar areas of atelectasis and/or scarring (left greater than right). 2. Aortic atherosclerosis. Electronically Signed   By: Vinnie Langton M.D.   On: 11/29/2018 09:13   Mr Abdomen Mrcp Moise Boring Contast  Result Date: 12/31/2018 CLINICAL DATA:  Inpatient. 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 Recent initiation of chemotherapy. Patient admitted with abnormal liver function tests, sepsis, altered mental status. EXAM: MRI ABDOMEN WITHOUT AND WITH CONTRAST (INCLUDING MRCP) TECHNIQUE: Multiplanar multisequence MR imaging of the abdomen was performed both before and after the administration of intravenous contrast. Heavily  T2-weighted images of the biliary and pancreatic ducts were obtained, and three-dimensional MRCP images were rendered by post processing. CONTRAST:  7 cc Gadavist IV. COMPARISON:  11/11/2018 MRI abdomen. FINDINGS: Lower chest: Small dependent bilateral pleural effusions, right greater than left, new from prior MRI. Passive atelectasis at the dependent lung bases bilaterally. Hepatobiliary: There are multiple heterogeneously enhancing poorly defined masses scattered throughout the right greater than left liver lobes, most of which are new since 11/11/2018 MRI. For example, right liver dome 2.5 x 2.0 cm mass (series 19/image 18), segment 7 right liver lobe 2.7 x 2.2 cm mass (series 19/image 24), peripheral right liver lobe 3.6 x 2.0 cm mass (series 19/image 28) and segment 2 left liver lobe 1.7 x 1.1 cm mass (series 19/image 29) are all new. Segment 8 right liver lobe 2.4 x 2.1 cm mass (series 19/image 22), previously 2.4 x 2.4  cm, not appreciably changed. 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 Cholecystectomy. No biliary ductal dilatation. Common bile duct diameter 4 mm. No evidence of choledocholithiasis. Pancreas: Mild diffuse peripancreatic edema. No pancreatic mass or duct dilation. Spleen: Normal size. No mass. Adrenals/Urinary Tract: Normal adrenals. No hydronephrosis. Normal kidneys with no renal mass. Stomach/Bowel: Normal non-distended stomach. Normal caliber visualized bowel loops. Generalized wall thickening in visualized small and large bowel. Vascular/Lymphatic: Nonaneurysmal abdominal aorta. Patent hepatic, splenic and renal veins. Patent SMV. No pathologically enlarged lymph nodes in the abdomen. Other: Small volume perihepatic ascites.  No focal fluid collection. Musculoskeletal: No aggressive appearing focal osseous lesions. IMPRESSION: 1. Several heterogeneously enhancing poorly defined liver masses scattered throughout the right greater than left liver lobes, most of which are new since 11/11/2018 MRI. Findings are compatible with progression of multifocal hepatocellular carcinoma. Previously visualized solitary segment 8 right liver mass is stable. 2. 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 3. Small volume perihepatic ascites. 4. Generalized bowel wall thickening and mild peripancreatic edema, nonspecific, potentially due to a portal hypertension and/or hypoproteinemia. 5. Small dependent bilateral pleural effusions, right greater than left, new. Electronically Signed   By: Ilona Sorrel M.D.   On: 12/31/2018 19:46   US Abdomen Limited Ruq  Result Date: 12/14/2018 CLINICAL DATA:  Hepatocellular carcinoma with tumor thrombus shown on prior imaging. Elevated liver enzymes. EXAM: ULTRASOUND ABDOMEN  LIMITED RIGHT UPPER QUADRANT COMPARISON:  11/11/2018 MRI FINDINGS: Gallbladder: Surgically absent Common bile duct: Diameter: 5 mm Liver: Heterogeneous right hepatic lobe likely due to underlying tumors and underlying tumor thrombus. There is notable thrombus in the vicinity of the right portal vein and portal vein confluence, with some reconstitution of portal venous flow or flow around the thrombus in the porta hepatis. There is some evidence of retrograde flow in parts of the portal vein before entering the liver for example on image 27 and image 28. There is also some heterogeneity in the left hepatic lobe, for example a 3.7 by 2.8 by 3.4 cm potential lesion posteriorly, and tumor involvement in the left hepatic lobe is not excluded. IMPRESSION: 1. Tumor primarily in the right hepatic lobe and possibly in the left hepatic lobe, with ill definition. 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 There is some retrograde flow in the segment of the portal vein leading towards this thrombus as shown on  image 27. 2. No significant biliary dilatation is identified. Electronically Signed   By: Van Clines M.D.   On: 12/13/2018 17:04    Assessment and plan-  Patient is a 73 y.o. female with newly diagnosed [place], just started on first cycle of Tecentriq plus Avastin currently presents with altered mental status.    #Altered mental status, resolved.  Close to baseline mental status. Most likely secondary to toxic encephalopathy. 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   #Presumed sepsis Work-up has been negative. Off antibiotics.   #Diarrhea, negative for C. difficile and GI panel.  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 Continue steroids.  Taper down to 50 mg daily. She had one episode of vomit this morning after taking her morning medication, possibly has  vomited her prednisone pills.  Had one episode of greenish semi-loose bowel movement. I will add 1 dose of prednisone 20 mg today. Continue 50 mg daily, slow taper 10 mg/week.  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 MRI 12/31/2018 shows worsening of tumor burden in the liver comparing to MRI that was done in May 2020.  Discussed with daughter and patient.  No plan for chemotherapy during admission.  She is not due for additional chemo until 01/13/2019. I had a discussion with patient and daughter today. If she is able to recover to acceptable condition, and adequate liver function,-Child A, for additional chemotherapy, I will switch her to other treatments. Currently at Child [facility] Prognosis is poor. AFP [id number], higher than pre-treatment level.   palliative care on board. Code status, [id number]  #Ascites, secondary to HCC/portal thrombus/portal hypertension. I discussed with patient about option of palliative paracentesis.  Currently patient reports no distention or discomfort. Can proceed with paracentesis if she becomes symptomatic.  # Hypernatremia, due to poor PO intake.   on D5. Monitor.   Thank you for allowing me to participate in the care of this patient.  We spent sufficient time to discuss many aspect of care, questions were answered to patient's satisfaction. Total face to face encounter time for this patient visit was 35 min. >50% of the time was  spent in counseling and coordination of care.     Earlie Server, MD, PhD Hematology Oncology [facility] at [facility] Pager- [phone] 01/04/2019

## 2019-01-04 NOTE — Care Management Important Message (Signed)
Important Message  Patient Details  Name: Abigail Ayers MRN: [id number] Date of Birth: [birth year]   Medicare Important Message Given:  Other (see comment)  Registration received verbal consent on 01/03/19 @ 10:19 am    [name] 01/04/2019, 8:56 AM

## 2019-01-04 NOTE — TOC Progression Note (Addendum)
Transition of Care [facility]) - Progression Note    Patient Details  Name: Abigail Ayers MRN: [id number] Date of Birth: 1946-06-06  [facility] CM/SW Contact  [name], [name] Phone Number: [phone]  01/04/2019, 10:46 AM  Clinical Narrative:  Per [facility] authorization has been received. Per [name] patient needs another covid test before [facility] will accept her. Patient is aware of above. Patient's daughter [name] is aware of above. [name] asked about home health vs SNF. CSW explained that SNF will have around the clock care and daily PT and OT. CSW explained that home health will have PT 2-3 days per week. Daughter is agreeable to [facility]. MD and Charge RN aware of above.      Expected Discharge Plan: [place] Barriers to Discharge: Continued Medical Work up  Expected Discharge Plan and Services Expected Discharge Plan: [place] In-house Referral: Clinical Social Work Discharge Planning Services: CM Consult Post Acute Care Choice: [place] arrangements for the past 2 months: Single Family Home                                       Social Determinants of Health (SDOH) Interventions    Readmission Risk Interventions No flowsheet data found.

## 2019-01-04 NOTE — Progress Notes (Signed)
ID Pt is awake and alert Leucocytosis could from steroids and also from Tumor burden  Zosyn Dc today  as all cultures Neg, HIV Neg, , Viral studies pending. ID will sign off-

## 2019-01-05 LAB — CBC
HCT: 38.6 % (ref 36.0–46.0)
Hemoglobin: 11.8 g/dL — ABNORMAL LOW (ref 12.0–15.0)
MCH: 23.7 pg — ABNORMAL LOW (ref 26.0–34.0)
MCHC: 30.6 g/dL (ref 30.0–36.0)
MCV: 77.5 fL — ABNORMAL LOW (ref 80.0–100.0)
Platelets: 177 10*3/uL (ref 150–400)
RBC: 4.98 MIL/uL (ref 3.87–5.11)
RDW: 23.6 % — ABNORMAL HIGH (ref 11.5–15.5)
WBC: 46.3 10*3/uL — ABNORMAL HIGH (ref 4.0–10.5)
nRBC: 13.9 % — ABNORMAL HIGH (ref 0.0–0.2)

## 2019-01-05 LAB — GLUCOSE, CAPILLARY
Glucose-Capillary: 128 mg/dL — ABNORMAL HIGH (ref 70–99)
Glucose-Capillary: 136 mg/dL — ABNORMAL HIGH (ref 70–99)
Glucose-Capillary: 141 mg/dL — ABNORMAL HIGH (ref 70–99)
Glucose-Capillary: 155 mg/dL — ABNORMAL HIGH (ref 70–99)
Glucose-Capillary: 158 mg/dL — ABNORMAL HIGH (ref 70–99)
Glucose-Capillary: 163 mg/dL — ABNORMAL HIGH (ref 70–99)
Glucose-Capillary: 164 mg/dL — ABNORMAL HIGH (ref 70–99)

## 2019-01-05 LAB — BASIC METABOLIC PANEL
Anion gap: 10 (ref 5–15)
BUN: 19 mg/dL (ref 8–23)
CO2: 24 mmol/L (ref 22–32)
Calcium: 8.9 mg/dL (ref 8.9–10.3)
Chloride: 118 mmol/L — ABNORMAL HIGH (ref 98–111)
Creatinine, Ser: 0.67 mg/dL (ref 0.44–1.00)
GFR calc Af Amer: >60 mL/min (ref >60)
GFR calc non Af Amer: >60 mL/min (ref >60)
Glucose, Bld: 164 mg/dL — ABNORMAL HIGH (ref 70–99)
Potassium: 3.1 mmol/L — ABNORMAL LOW (ref 3.5–5.1)
Sodium: 152 mmol/L — ABNORMAL HIGH (ref 135–145)

## 2019-01-05 LAB — HCV RNA QUANT: HCV Quantitative: NOT DETECTED [IU]/mL

## 2019-01-05 LAB — EPSTEIN BARR VRS(EBV DNA BY PCR)
EBV DNA QN by PCR: 5049 copies/mL
log10 EBV DNA Qn PCR: 3.703 log10 copy/mL

## 2019-01-05 LAB — PATHOLOGIST SMEAR REVIEW

## 2019-01-05 MED ORDER — POTASSIUM CHLORIDE CRYS ER 20 MEQ PO TBCR
40.0000 meq | EXTENDED_RELEASE_TABLET | ORAL | Status: AC
  Filled 2019-01-05: qty 2

## 2019-01-05 MED ORDER — SODIUM CHLORIDE 0.9 % IV BOLUS
500.0000 mL | Freq: Once | INTRAVENOUS | Status: AC
  Administered 2019-01-05: 500 mL via INTRAVENOUS

## 2019-01-05 MED ORDER — PREDNISONE 20 MG PO TABS: 40.0000 mg | ORAL_TABLET | Freq: Every day | ORAL | Status: DC

## 2019-01-05 NOTE — Progress Notes (Signed)
Hematology/Oncology Progress Note [facility] Telephone:([phone] [phone]  Patient Care Team: Sharyne Peach, MD as PCP - General (Family Medicine) Bary Castilla, Forest Gleason, MD (General Surgery) Sharyne Peach, MD (Family Medicine) Clent Jacks, RN as Registered Nurse   Name of the patient: Abigail Ayers  [id number]  09-Feb-1946  Date of visit: 01/05/19   INTERVAL HISTORY-  No acute events overnight.  No additional nausea or vomiting episodes today. She sits in the chair. No family members at bedside.  Review of systems- Review of Systems  Constitutional: Positive for fatigue. Negative for chills and fever.  Eyes: Positive for icterus.  Respiratory: Negative for shortness of breath.   Gastrointestinal: Positive for abdominal pain and nausea.  Skin: Negative for rash.  Neurological: Negative for headaches.    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   Claudication of calf muscles ([place]) 12/01/2017   Osteoarthritis of knee 06/17/2017   Nipple discharge 04/29/2013   Essential hypertension 09/17/2012     Past Medical History:  Diagnosis Date   Hypertension    not currently taking meds     Past Surgical History:  Procedure Laterality Date   BREAST BIOPSY Left    neg   CHOLECYSTECTOMY  1992    Social History   Socioeconomic History   Marital status: Widowed   Spouse name: Not on file   Number of children: Not on file   Years of education: Not on file   Highest education level: Not on file  Occupational History   Not on file  Social Needs   Financial resource strain: Not on file   Food insecurity    Worry: Not on file    Inability: Not on file   Transportation needs    Medical: Not on file    Non-medical: Not on file  Tobacco Use   Smoking status: Never Smoker   Smokeless tobacco: Never Used  Substance and Sexual Activity   Alcohol use: No   Drug use: No   Sexual activity: Not on file  Lifestyle   Physical activity    Days per week: Not on file    Minutes per session: Not on file   Stress: Not on file  Relationships   Social connections    Talks on phone: Not on file    Gets together: Not on file    Attends religious service: Not on file    Active member of club or organization: Not on file    Attends meetings of clubs or organizations: Not on file    Relationship status: Not on file   Intimate partner violence    Fear of current or ex partner: Not on file    Emotionally abused: Not on file    Physically abused: Not on file    Forced sexual activity: Not on file  Other Topics Concern   Not on file  Social History Narrative  Not on file     Family History  Problem Relation Age of Onset   Alzheimer's disease Mother    Stroke Father    Breast cancer Daughter 53       [place], genetic test negative     Current Facility-Administered Medications:    acetaminophen (TYLENOL) tablet 650 mg, 650 mg, Oral, Q6H PRN, Mody, Sital, MD   amLODipine (NORVASC) tablet 5 mg, 5 mg, Oral, Daily, Mody, Sital, MD, 5 mg at 01/05/19 0857   chlorhexidine (PERIDEX) 0.12 % solution 15 mL, 15 mL, Mouth Rinse, BID, Tukov-Yual, Magdalene S, NP, 15 mL at 01/05/19 0858   dextrose 5 % in lactated ringers infusion, , Intravenous, Continuous, Mody, Sital, MD, Last Rate: 100 mL/hr at 01/05/19 0858   hydrALAZINE (APRESOLINE)  injection 10 mg, 10 mg, Intravenous, Q6H PRN, Mody, Sital, MD   MEDLINE mouth rinse, 15 mL, Mouth Rinse, q12n4p, Tukov-Yual, Magdalene S, NP, 15 mL at 01/03/19 1523   metoprolol tartrate (LOPRESSOR) tablet 50 mg, 50 mg, Oral, BID, Mody, Sital, MD, 50 mg at 01/05/19 0857   ondansetron (ZOFRAN) tablet 4 mg, 4 mg, Oral, Q6H PRN **OR** ondansetron (ZOFRAN) injection 4 mg, 4 mg, Intravenous, Q6H PRN, Pyreddy, Pavan, MD, 4 mg at 01/05/19 0258   pantoprazole (PROTONIX) injection 40 mg, 40 mg, Intravenous, Q12H, Blakeney, Dreama Saa, NP, 40 mg at 01/05/19 0857   [START ON 01/06/2019] predniSONE (DELTASONE) tablet 40 mg, 40 mg, Oral, Q breakfast, Mody, Sital, MD   simethicone (MYLICON) chewable tablet 80 mg, 80 mg, Oral, Q6H PRN, Awilda Bill, NP, 80 mg at 01/04/19 1550   sodium chloride flush (NS) 0.9 % injection 10-40 mL, 10-40 mL, Intracatheter, Q12H, Sudini, Srikar, MD, 10 mL at 01/04/19 2054   sodium chloride flush (NS) 0.9 % injection 10-40 mL, 10-40 mL, Intracatheter, PRN, Sudini, Srikar, MD   traMADol (ULTRAM) tablet 50 mg, 50 mg, Oral, Q6H PRN, Bettey Costa, MD, 50 mg at 01/04/19 1527   Physical exam:  Vitals:   01/05/19 0427 01/05/19 0910 01/05/19 1619 01/05/19 1645  BP: (!) 151/101 (!) 159/106 (!) 66/22 (!) 88/62  Pulse: (!) 122 (!) 112 (!) 54 (!) 116  Resp: 16 18 (!) 24 19  Temp: 97.8 F (36.6 C) 97.6 F (36.4 C) (!) 97.5 F (36.4 C)   TempSrc: Oral Oral    SpO2: 92% 95% 93%   Weight:      Height:       Physical Exam  Constitutional: No distress.  Frail appearance female sitting the chair  HENT:  Head: Normocephalic and atraumatic.  Nose: Nose normal.  Mouth/Throat: Oropharynx is clear and moist. No oropharyngeal exudate.  Eyes: Pupils are equal, round, and reactive to light. EOM are normal. No scleral icterus.  Neck: Normal range of motion. Neck supple.  Cardiovascular: Normal rate and regular rhythm.  No murmur heard. tachycardia  Pulmonary/Chest: Effort normal. No  respiratory distress. She has no rales. She exhibits no tenderness.  Abdominal: Soft. Bowel sounds are normal. She exhibits distension. There is no abdominal tenderness.  Musculoskeletal: Normal range of motion.        General: No edema.  Neurological: She is alert. No cranial nerve deficit. She exhibits normal muscle tone. Coordination normal.   answer simple questions. She moves all 4 extremities. Recognize me as treating physician.  Skin: Skin is warm and dry. She is not diaphoretic. No erythema.  Psychiatric: Affect normal.      CMP Latest Ref Rng & Units 01/05/2019  Glucose  70 - 99 mg/dL 164(H)  BUN 8 - 23 mg/dL 19  Creatinine 0.44 - 1.00 mg/dL 0.67  Sodium 135 - 145 mmol/L 152(H)  Potassium 3.5 - 5.1 mmol/L 3.1(L)  Chloride 98 - 111 mmol/L 118(H)  CO2 22 - 32 mmol/L 24  Calcium 8.9 - 10.3 mg/dL 8.9  Total Protein 6.5 - 8.1 g/dL -  Total Bilirubin 0.3 - 1.2 mg/dL -  Alkaline Phos 38 - 126 U/L -  AST 15 - 41 U/L -  ALT 0 - 44 U/L -   CBC Latest Ref Rng & Units 01/05/2019  WBC 4.0 - 10.5 K/uL 46.3(H)  Hemoglobin 12.0 - 15.0 g/dL 11.8(L)  Hematocrit 36.0 - 46.0 % 38.6  Platelets 150 - 400 K/uL 177   RADIOGRAPHIC STUDIES: I have personally reviewed the radiological images as listed and agreed with the findings in the report.   Dg Abd 1 View  Result Date: 01/04/2019 CLINICAL DATA:  Nausea, vomiting, abdominal pain EXAM: ABDOMEN - 1 VIEW COMPARISON:  CT abdomen 11/05/2018 FINDINGS: There is mild gaseous distension of the small bowel. There is no bowel dilatation to suggest obstruction. There is no evidence of pneumoperitoneum, portal venous gas or pneumatosis. There are no pathologic calcifications along the expected course of the ureters. The osseous structures are unremarkable. IMPRESSION: No acute abdominal abnormality. Electronically Signed   By: Kathreen Devoid   On: 01/04/2019 11:53   Ct Head Wo Contrast  Result Date: 12/10/2018 CLINICAL DATA:  Liver carcinoma with altered  mental status/confusion EXAM: CT HEAD WITHOUT CONTRAST TECHNIQUE: Contiguous axial images were obtained from the base of the skull through the vertex without intravenous contrast. COMPARISON:  None. FINDINGS: Brain: Ventricles and sulci are within normal limits for age. There is no demonstrable intracranial mass, hemorrhage, extra-axial fluid collection, or midline shift. There is slight small vessel disease in the centra semiovale bilaterally. No acute infarct evident. Vascular: No hyperdense vessel. There are foci of calcification in each carotid siphon region. Skull: The bony calvarium appears intact. No neoplastic focus involving bony structures. Sinuses/Orbits: Visualized paranasal sinuses are clear. Visualized orbits appear symmetric bilaterally. Other: Mastoid air cells are clear. IMPRESSION: Mild periventricular small vessel disease. No acute infarct. No mass or hemorrhage. There are foci of arterial vascular calcification. Electronically Signed   By: Lowella Grip III M.D.   On: 11/29/2018 09:46   Ct Chest Wo Contrast  Result Date: 12/15/2018 CLINICAL DATA:  Hepatocellular carcinoma staging. Chest pain. Portal vein thrombosis. EXAM: CT CHEST WITHOUT CONTRAST TECHNIQUE: Multidetector CT imaging of the chest was performed following the standard protocol without IV contrast. COMPARISON:  Abdominal CT 11/05/2018.  No prior chest imaging. FINDINGS: Cardiovascular: Aortic atherosclerosis. Tortuous thoracic aorta. Borderline cardiomegaly. Multivessel coronary artery atherosclerosis. Mediastinum/Nodes: No supraclavicular adenopathy. No mediastinal or definite hilar adenopathy, given limitations of unenhanced CT. Lungs/Pleura: Trace right pleural fluid. No suspicious pulmonary nodule or mass. Areas of bilateral major fissure thickening versus small subpleural lymph nodes. A right apical subpleural 3 mm nodule on image 29/3 is also most likely a subpleural lymph node. Upper Abdomen: Cirrhosis with  heterogeneity throughout the liver, better evaluated on contrast-enhanced studies. New small volume ascites since 11/05/2018. Musculoskeletal: Thoracic spondylosis. IMPRESSION: 1.  No acute process or evidence of metastatic disease in the chest. 2. Coronary artery atherosclerosis. Aortic Atherosclerosis (ICD10-I70.0). 3. Trace right pleural fluid. 4. New small volume ascites. Electronically Signed   By: Abigail Miyamoto M.D.   On: 12/15/2018 08:51   Mr Brain Wo Contrast  Result  Date: 12/31/2018 CLINICAL DATA:  73 year old female with altered mental status, suspected sepsis. Liver tumor. EXAM: MRI HEAD WITHOUT CONTRAST TECHNIQUE: Multiplanar, multiecho pulse sequences of the brain and surrounding structures were obtained without intravenous contrast. COMPARISON:  Head CT 12/07/2018 and earlier. FINDINGS: Study is intermittently degraded by motion artifact despite repeated imaging attempts. Brain: No restricted diffusion to suggest acute infarction. No midline shift, mass effect, evidence of mass lesion, ventriculomegaly, extra-axial collection or acute intracranial hemorrhage. Cervicomedullary junction and pituitary are within normal limits. Scattered bilateral cerebral white matter T2 and FLAIR hyperintensity, mild to moderate for age. No cortical encephalomalacia or chronic cerebral blood products identified. No intrinsic increased T1 signal in the deep gray matter nuclei. The basal ganglia, thalami, brainstem, and cerebellum appear normal for age. Vascular: Major intracranial vascular flow voids are preserved. Skull and upper cervical spine: Negative visible cervical spine. Visible bone marrow signal appears within normal limits. Sinuses/Orbits: Negative orbits. Paranasal sinuses and mastoids are stable and well pneumatized. Other: Scalp and face soft tissues appear negative. IMPRESSION: 1.  No acute intracranial abnormality. 2. Mild to moderate for age cerebral white matter signal changes, most commonly due to  chronic small vessel disease. Electronically Signed   By: Genevie Ann M.D.   On: 12/31/2018 16:09   Mr 3d Recon At Scanner  Result Date: 12/31/2018 CLINICAL DATA:  Inpatient. 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 Recent initiation of chemotherapy. Patient admitted with abnormal liver function tests, sepsis, altered mental status. EXAM: MRI ABDOMEN WITHOUT AND WITH CONTRAST (INCLUDING MRCP) TECHNIQUE: Multiplanar multisequence MR imaging of the abdomen was performed both before and after the administration of intravenous contrast. Heavily T2-weighted images of the biliary and pancreatic ducts were obtained, and three-dimensional MRCP images were rendered by post processing. CONTRAST:  7 cc Gadavist IV. COMPARISON:  11/11/2018 MRI abdomen. FINDINGS: Lower chest: Small dependent bilateral pleural effusions, right greater than left, new from prior MRI. Passive atelectasis at the dependent lung bases bilaterally. Hepatobiliary: There are multiple heterogeneously enhancing poorly defined masses scattered throughout the right greater than left liver lobes, most of which are new since 11/11/2018 MRI. For example, right liver dome 2.5 x 2.0 cm mass (series 19/image 18), segment 7 right liver lobe 2.7 x 2.2 cm mass (series 19/image 24), peripheral right liver lobe 3.6 x 2.0 cm mass (series 19/image 28) and segment 2 left liver lobe 1.7 x 1.1 cm mass (series 19/image 29) are all new. Segment 8 right liver lobe 2.4 x 2.1 cm mass (series 19/image 22), previously 2.4 x 2.4 cm, not appreciably changed. 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 Cholecystectomy. No biliary ductal dilatation. Common bile duct diameter 4 mm. No evidence of choledocholithiasis. Pancreas: Mild diffuse peripancreatic edema. No pancreatic mass or duct  dilation. Spleen: Normal size. No mass. Adrenals/Urinary Tract: Normal adrenals. No hydronephrosis. Normal kidneys with no renal mass. Stomach/Bowel: Normal non-distended stomach. Normal caliber visualized bowel loops. Generalized wall thickening in visualized small and large bowel. Vascular/Lymphatic: Nonaneurysmal abdominal aorta. Patent hepatic, splenic and renal veins. Patent SMV. No pathologically enlarged lymph nodes in the abdomen. Other: Small volume perihepatic ascites.  No focal fluid collection. Musculoskeletal: No aggressive appearing focal osseous lesions. IMPRESSION: 1. Several heterogeneously enhancing poorly defined liver masses scattered throughout the right greater than left liver lobes, most of which are new since 11/11/2018 MRI. Findings are compatible with  progression of multifocal hepatocellular carcinoma. Previously visualized solitary segment 8 right liver mass is stable. 2. 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 3. Small volume perihepatic ascites. 4. Generalized bowel wall thickening and mild peripancreatic edema, nonspecific, potentially due to a portal hypertension and/or hypoproteinemia. 5. Small dependent bilateral pleural effusions, right greater than left, new. Electronically Signed   By: Ilona Sorrel M.D.   On: 12/31/2018 19:46   Dg Chest Port 1 View  Result Date: 12/10/2018 CLINICAL DATA:  73 year old female with history of confusion and disorientation after initial doses of chemotherapy for liver cancer. EXAM: PORTABLE CHEST 1 VIEW COMPARISON:  No priors. FINDINGS: Lung volumes are low. Linear opacities in lung bases bilaterally favored to reflect areas of subsegmental atelectasis or scarring. No consolidative airspace disease. No pleural effusions. No evidence of pulmonary edema. Heart size is normal. The patient is rotated to the left on today's exam, resulting in distortion of the mediastinal contours  and reduced diagnostic sensitivity and specificity for mediastinal pathology. Aortic atherosclerosis. IMPRESSION: 1. Low lung volumes with bibasilar areas of atelectasis and/or scarring (left greater than right). 2. Aortic atherosclerosis. Electronically Signed   By: Vinnie Langton M.D.   On: 12/10/2018 09:13   Mr Abdomen Mrcp Moise Boring Contast  Result Date: 12/31/2018 CLINICAL DATA:  Inpatient. 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 Recent initiation of chemotherapy. Patient admitted with abnormal liver function tests, sepsis, altered mental status. EXAM: MRI ABDOMEN WITHOUT AND WITH CONTRAST (INCLUDING MRCP) TECHNIQUE: Multiplanar multisequence MR imaging of the abdomen was performed both before and after the administration of intravenous contrast. Heavily T2-weighted images of the biliary and pancreatic ducts were obtained, and three-dimensional MRCP images were rendered by post processing. CONTRAST:  7 cc Gadavist IV. COMPARISON:  11/11/2018 MRI abdomen. FINDINGS: Lower chest: Small dependent bilateral pleural effusions, right greater than left, new from prior MRI. Passive atelectasis at the dependent lung bases bilaterally. Hepatobiliary: There are multiple heterogeneously enhancing poorly defined masses scattered throughout the right greater than left liver lobes, most of which are new since 11/11/2018 MRI. For example, right liver dome 2.5 x 2.0 cm mass (series 19/image 18), segment 7 right liver lobe 2.7 x 2.2 cm mass (series 19/image 24), peripheral right liver lobe 3.6 x 2.0 cm mass (series 19/image 28) and segment 2 left liver lobe 1.7 x 1.1 cm mass (series 19/image 29) are all new. Segment 8 right liver lobe 2.4 x 2.1 cm mass (series 19/image 22), previously 2.4 x 2.4 cm, not appreciably changed. Persistent enhancing tumor thrombus in the distal main portal vein and right and left portal veins, slightly decreased in the interval, now  appearing near occlusive instead of occlusive. Cholecystectomy. No biliary ductal dilatation. Common bile duct diameter 4 mm. No evidence of choledocholithiasis. Pancreas: Mild diffuse peripancreatic edema. No pancreatic mass or duct dilation. Spleen: Normal size. No mass. Adrenals/Urinary Tract: Normal adrenals. No hydronephrosis. Normal kidneys with no renal mass. Stomach/Bowel: Normal non-distended stomach. Normal caliber visualized bowel loops. Generalized wall thickening in visualized small and large bowel. Vascular/Lymphatic: Nonaneurysmal abdominal aorta. Patent hepatic, splenic and renal veins. Patent SMV. No pathologically enlarged lymph nodes in the abdomen. Other: Small volume perihepatic ascites.  No focal fluid collection. Musculoskeletal: No aggressive appearing focal osseous lesions. IMPRESSION: 1. Several heterogeneously enhancing poorly defined liver masses scattered throughout the right greater than left liver lobes, most of which are new since 11/11/2018 MRI. Findings are  compatible with progression of multifocal hepatocellular carcinoma. Previously visualized solitary segment 8 right liver mass is stable. 2. 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 3. Small volume perihepatic ascites. 4. Generalized bowel wall thickening and mild peripancreatic edema, nonspecific, potentially due to a portal hypertension and/or hypoproteinemia. 5. Small dependent bilateral pleural effusions, right greater than left, new. Electronically Signed   By: Ilona Sorrel M.D.   On: 12/31/2018 19:46   US Abdomen Limited Ruq  Result Date: 12/14/2018 CLINICAL DATA:  Hepatocellular carcinoma with tumor thrombus shown on prior imaging. Elevated liver enzymes. EXAM: ULTRASOUND ABDOMEN LIMITED RIGHT UPPER QUADRANT COMPARISON:  11/11/2018 MRI FINDINGS: Gallbladder: Surgically absent Common bile duct: Diameter: 5 mm Liver: Heterogeneous right hepatic lobe  likely due to underlying tumors and underlying tumor thrombus. There is notable thrombus in the vicinity of the right portal vein and portal vein confluence, with some reconstitution of portal venous flow or flow around the thrombus in the porta hepatis. There is some evidence of retrograde flow in parts of the portal vein before entering the liver for example on image 27 and image 28. There is also some heterogeneity in the left hepatic lobe, for example a 3.7 by 2.8 by 3.4 cm potential lesion posteriorly, and tumor involvement in the left hepatic lobe is not excluded. IMPRESSION: 1. Tumor primarily in the right hepatic lobe and possibly in the left hepatic lobe, with ill definition. 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 There is some retrograde flow in the segment of the portal vein leading towards this thrombus as shown on image 27. 2. No significant biliary dilatation is identified. Electronically Signed   By: Van Clines M.D.   On: 11/30/2018 17:04    Assessment and plan-  Patient is a 73 y.o. female with newly diagnosed [place], just started on first cycle of Tecentriq plus Avastin currently presents with altered mental status.    #Altered mental status, resolved.  She is close to her baseline mental status. Most likely secondary to toxic encephalopathy. 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   #Presumed sepsis Work-up has been negative. Off antibiotics.   #Diarrhea, negative for C. difficile and GI panel.  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 Continue steroids.  Taper down to 50 mg daily. Continue 50 mg prednisone daily.  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 MRI 12/31/2018 shows worsening of tumor burden in the liver comparing to MRI that was done in May 2020.  Discussed with daughter and  patient.  No plan for chemotherapy during admission.  She is not due for additional chemo until 01/13/2019. I had a discussion with patient and daughter today. If she is able to recover to acceptable condition, and adequate liver function,-Child A, for additional chemotherapy, I will switch her to other treatments. Currently at Child [facility] Prognosis is poor. AFP [id number], higher than pre-treatment level.  Overall poor prognosis.  #Ascites, secondary to HCC/portal thrombus/portal hypertension. Can consider palliative therapeutic paracentesis if she becomes symptomatic.  # Hypernatremia, due to poor oral intake.  Continue to monitor.  On D5 #[place] leukocytosis, most likely reactive.  Due to steroid use and malignancy. Pathology smear results reviewed.  Thank you for allowing me to participate in the care of this patient.    Earlie Server, MD, PhD Hematology [facility]- [phone] 01/05/2019

## 2019-01-05 NOTE — Care Management Important Message (Signed)
Important Message  Patient Details  Name: Abigail Ayers MRN: [id number] Date of Birth: [birth year]   Medicare Important Message Given:  Yes     Abigail Ayers 01/05/2019, 11:02 AM

## 2019-01-05 NOTE — Progress Notes (Signed)
[place] [facility] [place] NAME: Abigail Ayers    MR#:  [id number]  DATE OF BIRTH:  [birth year]  SUBJECTIVE:   No acute events overnight reported.  Patient says that she tolerated her supper well.  No nausea or vomiting this morning.  REVIEW OF SYSTEMS:    Review of Systems  Constitutional: Negative for fever, chills weight loss HENT: Negative for ear pain, nosebleeds, congestion, facial swelling, rhinorrhea, neck pain, neck stiffness and ear discharge.   Respiratory: Negative for cough, shortness of breath, wheezing  Cardiovascular: Negative for chest pain, palpitations and leg swelling.  Gastrointestinal: Negative for heartburn, abdominal pain, , diarrhea or consitpation Genitourinary: Negative for dysuria, urgency, frequency, hematuria Musculoskeletal: Negative for back pain or joint pain Neurological: Negative for dizziness, seizures, syncope, focal weakness,  numbness and headaches.  Hematological: Does not bruise/bleed easily.  Psychiatric/Behavioral: Negative for hallucinations, confusion, dysphoric mood    Tolerating Diet: Yes    DRUG ALLERGIES:   Allergies  Allergen Reactions  . Lisinopril Other (See Comments)    Other reaction(s): Cough    VITALS:  Blood pressure (!) 151/101, pulse (!) 122, temperature 97.8 F (36.6 C), temperature source Oral, resp. rate 16, height 5' 0.5" (1.537 m), weight 76.7 kg, SpO2 92 %.  PHYSICAL EXAMINATION:  Constitutional: Appears well-developed and well-nourished. No distress. HENT: Normocephalic. Marland Kitchen Oropharynx is clear and moist.  Eyes: Conjunctivae and EOM are normal. PERRLA, no scleral icterus.  Neck: Normal ROM. Neck supple. No JVD. No tracheal deviation. CVS: RRR, S1/S2 +, no murmurs, no gallops, no carotid bruit.  Pulmonary: Effort and breath sounds normal, no stridor, rhonchi, wheezes, rales.  Abdominal: Soft. BS +,  ++ distension, no tenderness, rebound or guarding.  Musculoskeletal:  Normal range of motion. No edema and no tenderness.  Neuro: Alert. CN 2-12 grossly intact. No focal deficits. Skin: Skin is warm and dry. No rash noted. Psychiatric: Normal mood and affect.  Globally weak    LABORATORY PANEL:   CBC Recent Labs  Lab 01/05/19 0450  WBC 46.3*  HGB 11.8*  HCT 38.6  PLT 177   ------------------------------------------------------------------------------------------------------------------  Chemistries  Recent Labs  Lab 01/02/19 0523 01/03/19 0433  01/05/19 0450  NA 150* 148*   < > 152*  K 3.5 3.4*   < > 3.1*  CL 119* 113*   < > 118*  CO2 25 24   < > 24  GLUCOSE 178* 149*   < > 164*  BUN 25* 21   < > 19  CREATININE 0.82 0.76   < > 0.67  CALCIUM 8.4* 8.7*   < > 8.9  MG 1.9 2.0  --   --   AST 62*  --   --   --   ALT 41  --   --   --   ALKPHOS 304*  --   --   --   BILITOT 1.8*  --   --   --    < > = values in this interval not displayed.   ------------------------------------------------------------------------------------------------------------------  Cardiac Enzymes No results for input(s): TROPONINI in the last 168 hours. ------------------------------------------------------------------------------------------------------------------  RADIOLOGY:  Dg Abd 1 View  Result Date: 01/04/2019 CLINICAL DATA:  Nausea, vomiting, abdominal pain EXAM: ABDOMEN - 1 VIEW COMPARISON:  CT abdomen 11/05/2018 FINDINGS: There is mild gaseous distension of the small bowel. There is no bowel dilatation to suggest obstruction. There is no evidence of pneumoperitoneum, portal venous gas or pneumatosis. There are no pathologic calcifications  along the expected course of the ureters. The osseous structures are unremarkable. IMPRESSION: No acute abdominal abnormality. Electronically Signed   By: Kathreen Devoid   On: 01/04/2019 11:53     ASSESSMENT AND PLAN:   73 year old female with hepatocellular carcinoma who presented to the emergency room due to altered  mental status.  1.  Acute metabolic encephalopathy: Patient has been evaluated by oncology, GI, neurology and ID.  Etiology of encephalopathy is metabolic in nature.  Encephalopathy has resolved. 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 All cultures have been negative.  Imaging has been unremarkable. Steroids have been changed to oral and arebeing tapered.  Antibiotics have been discontinued as per ID recommendations.  Sepsis ruled out White blood cell elevation may be to tumor burden. Will repeat in am   2.  Hepatocellular carcinoma/ascites: Patient will continue follow-up with oncology. If she is able to recover to acceptable condition, and adequate liver function,-Child A, for additional chemotherapy, Her overall prognosis is very  poor. AFP [id number], higher than pre-treatment level.  She is asymptomatic from her ascites.  If she reports discomfort then will proceed with paracentesis.  3.  Acute kidney injury in the setting of dehydration and poor p.o. intake which is improved with IV fluids  4.  Hypernatremia: I will increase D5 and repeat BMP in a.m.  5.  Hypokalemia: Replete and recheck in a.m.  6.  Elevated LFTs in the setting of hepatocellular carcinoma   7.  Hypertension with tachycardia: Continue Metoprolol and Norvasc  9.  Nausea: Check KUB  Physical therapy is recommending skilled nursing facility at discharge.   COVID testing negative.  Management plans discussed with the patient and daughter and they are in agreement.  CODE STATUS: Full TOTAL TIME TAKING CARE OF THIS PATIENT: 27 minutes.     POSSIBLE D/C tomorrow [facility], DEPENDING ON CLINICAL CONDITION.   Bettey Costa M.D on 01/05/2019 at 11:21 AM  Between 7am to 6pm - Pager - [phone] After 6pm go to [email or web address] - password [email or web address] [facility]  Office  [phone]  CC: Primary care physician; Sharyne Peach,  MD  Note: This dictation was prepared with Dragon dictation along with smaller phrase technology. Any transcriptional errors that result from this process are unintentional.

## 2019-01-05 NOTE — Progress Notes (Addendum)
Pt BP 66/22 with dinamap @ approx 1615. BP 88/62 with manual BP. MD notified. Order for one time 540ml bolus over an hour. Pt asymptomatic at this time. Will continue to monitor.

## 2019-01-05 NOTE — TOC Progression Note (Signed)
Transition of Care [facility]) - Progression Note    Patient Details  Name: Abigail Ayers MRN: [id number] Date of Birth: [birth year]  [facility] CM/SW Contact  Barbarita Hutmacher, [name] Phone Number: [phone]  01/05/2019, 9:59 AM  Clinical Narrative: Clinical Social Worker (CSW) met with patient alone at bedside. CSW made patient aware that per MD she is not going to D/C her today. Patient reported that she wants to go to [facility] however her children want her to go home. CSW explained that patient can make the decision. Patient reported that she wants to go to [facility]. [facility] liaison is aware of above. CSW will continue to follow and assist as needed.      Expected Discharge Plan: [place] Barriers to Discharge: Continued Medical Work up  Expected Discharge Plan and Services Expected Discharge Plan: [place] In-house Referral: Clinical Social Work Discharge Planning Services: CM Consult Post Acute Care Choice: [place] arrangements for the past 2 months: Single Family Home                                       Social Determinants of Health (SDOH) Interventions    Readmission Risk Interventions No flowsheet data found.

## 2019-01-05 NOTE — Progress Notes (Signed)
Pharmacy Electrolyte Monitoring Consult:  Pharmacy consulted to assist in monitoring and replacing electrolytes in this 73 y.o. female admitted on 12/21/2018. Patient with  Extensive protal venous thrombosis s/p immunotherapy. Patient admitted with lethargy and AKI. Patient is off vasopressors.   Labs:  Sodium (mmol/L)  Date Value  01/05/2019 152 (H)   Potassium (mmol/L)  Date Value  01/05/2019 3.1 (L)   Magnesium (mg/dL)  Date Value  01/03/2019 2.0   Phosphorus (mg/dL)  Date Value  01/03/2019 2.7   Calcium (mg/dL)  Date Value  01/05/2019 8.9   Albumin (g/dL)  Date Value  01/03/2019 2.3 (L)   Corrected Ca: 10 mg/dL  Assessment/Plan:  Patient is currently receiving D5/LR at 22mL/hr    Hypernatremia noted but beyond scope of practice for pharmacy  Will order KCl 39mEq x 2 doses.  Will replace for goal potassium ~ 4 mmol/L,  magnesium ~ 2 mg/dL and phosphorous > 2.5 mg/dL  Pharmacy will continue to monitor and adjust per consult.    Pearla Dubonnet, PharmD 01/05/2019 7:28 AM

## 2019-01-06 ENCOUNTER — Telehealth: Payer: Self-pay | Admitting: *Deleted

## 2019-01-06 ENCOUNTER — Other Ambulatory Visit: Payer: Self-pay | Admitting: Oncology

## 2019-01-06 ENCOUNTER — Inpatient Hospital Stay: Payer: Medicare Other

## 2019-01-06 DIAGNOSIS — R400 Somnolence: Secondary | ICD-10-CM

## 2019-01-06 DIAGNOSIS — R7989 Other specified abnormal findings of blood chemistry: Secondary | ICD-10-CM

## 2019-01-06 DIAGNOSIS — R180 Malignant ascites: Secondary | ICD-10-CM

## 2019-01-06 DIAGNOSIS — Z7189 Other specified counseling: Secondary | ICD-10-CM

## 2019-01-06 DIAGNOSIS — N179 Acute kidney failure, unspecified: Secondary | ICD-10-CM

## 2019-01-06 DIAGNOSIS — R945 Abnormal results of liver function studies: Secondary | ICD-10-CM

## 2019-01-06 DIAGNOSIS — R188 Other ascites: Secondary | ICD-10-CM

## 2019-01-06 DIAGNOSIS — D72823 Leukemoid reaction: Secondary | ICD-10-CM

## 2019-01-06 DIAGNOSIS — K767 Hepatorenal syndrome: Secondary | ICD-10-CM

## 2019-01-06 LAB — BLOOD GAS, ARTERIAL
Acid-base deficit: 19.5 mmol/L — ABNORMAL HIGH (ref 0.0–2.0)
Bicarbonate: 9.1 mmol/L — ABNORMAL LOW (ref 20.0–28.0)
FIO2: 100
MECHVT: 500 mL
Mechanical Rate: 18
O2 Saturation: 91.9 %
PEEP: 5 cmH2O
Patient temperature: 37
pCO2 arterial: 30 mmHg — ABNORMAL LOW (ref 32.0–48.0)
pH, Arterial: 7.09 — CL (ref 7.350–7.450)
pO2, Arterial: 87 mmHg (ref 83.0–108.0)

## 2019-01-06 LAB — BASIC METABOLIC PANEL
Anion gap: 16 — ABNORMAL HIGH (ref 5–15)
BUN: 28 mg/dL — ABNORMAL HIGH (ref 8–23)
CO2: 20 mmol/L — ABNORMAL LOW (ref 22–32)
Calcium: 8.9 mg/dL (ref 8.9–10.3)
Chloride: 116 mmol/L — ABNORMAL HIGH (ref 98–111)
Creatinine, Ser: 1.74 mg/dL — ABNORMAL HIGH (ref 0.44–1.00)
GFR calc Af Amer: 34 mL/min — ABNORMAL LOW (ref >60)
GFR calc non Af Amer: 29 mL/min — ABNORMAL LOW (ref >60)
Glucose, Bld: 154 mg/dL — ABNORMAL HIGH (ref 70–99)
Potassium: 3.7 mmol/L (ref 3.5–5.1)
Sodium: 152 mmol/L — ABNORMAL HIGH (ref 135–145)

## 2019-01-06 LAB — GLUCOSE, CAPILLARY
Glucose-Capillary: 127 mg/dL — ABNORMAL HIGH (ref 70–99)
Glucose-Capillary: 137 mg/dL — ABNORMAL HIGH (ref 70–99)
Glucose-Capillary: 118 mg/dL — ABNORMAL HIGH (ref 70–99)
Glucose-Capillary: 73 mg/dL (ref 70–99)
Glucose-Capillary: 100 mg/dL — ABNORMAL HIGH (ref 70–99)
Glucose-Capillary: 117 mg/dL — ABNORMAL HIGH (ref 70–99)
Glucose-Capillary: 57 mg/dL — ABNORMAL LOW (ref 70–99)
Glucose-Capillary: 101 mg/dL — ABNORMAL HIGH (ref 70–99)
Glucose-Capillary: 83 mg/dL (ref 70–99)
Glucose-Capillary: 80 mg/dL (ref 70–99)
Glucose-Capillary: 64 mg/dL — ABNORMAL LOW (ref 70–99)
Glucose-Capillary: 84 mg/dL (ref 70–99)
Glucose-Capillary: 95 mg/dL (ref 70–99)

## 2019-01-06 LAB — CBC WITH DIFFERENTIAL/PLATELET
Abs Immature Granulocytes: 0.3 10*3/uL — ABNORMAL HIGH (ref 0.00–0.07)
Band Neutrophils: 8 %
Basophils Absolute: 0 10*3/uL (ref 0.0–0.1)
Basophils Relative: 0 %
Eosinophils Absolute: 0 10*3/uL (ref 0.0–0.5)
Eosinophils Relative: 0 %
HCT: 39.1 % (ref 36.0–46.0)
Hemoglobin: 11.1 g/dL — ABNORMAL LOW (ref 12.0–15.0)
Lymphocytes Relative: 1 %
Lymphs Abs: 0.3 10*3/uL — ABNORMAL LOW (ref 0.7–4.0)
MCH: 24.1 pg — ABNORMAL LOW (ref 26.0–34.0)
MCHC: 28.4 g/dL — ABNORMAL LOW (ref 30.0–36.0)
MCV: 85 fL (ref 80.0–100.0)
Monocytes Absolute: 0.3 10*3/uL (ref 0.1–1.0)
Monocytes Relative: 1 %
Myelocytes: 1 %
Neutro Abs: 26.2 10*3/uL — ABNORMAL HIGH (ref 1.7–7.7)
Neutrophils Relative %: 89 %
Platelets: 158 10*3/uL (ref 150–400)
RBC: 4.6 MIL/uL (ref 3.87–5.11)
RDW: 25.2 % — ABNORMAL HIGH (ref 11.5–15.5)
WBC: 27 10*3/uL — ABNORMAL HIGH (ref 4.0–10.5)
nRBC: 43 /100 WBC — ABNORMAL HIGH

## 2019-01-06 LAB — CBC
HCT: 40.4 % (ref 36.0–46.0)
Hemoglobin: 12 g/dL (ref 12.0–15.0)
MCH: 24.3 pg — ABNORMAL LOW (ref 26.0–34.0)
MCHC: 29.7 g/dL — ABNORMAL LOW (ref 30.0–36.0)
MCV: 81.9 fL (ref 80.0–100.0)
Platelets: 157 10*3/uL (ref 150–400)
RBC: 4.93 MIL/uL (ref 3.87–5.11)
RDW: 25 % — ABNORMAL HIGH (ref 11.5–15.5)
WBC: 57.2 10*3/uL (ref 4.0–10.5)
nRBC: 11.3 % — ABNORMAL HIGH (ref 0.0–0.2)

## 2019-01-06 LAB — MAGNESIUM
Magnesium: 2 mg/dL (ref 1.7–2.4)
Magnesium: 2.1 mg/dL (ref 1.7–2.4)

## 2019-01-06 LAB — LACTIC ACID, PLASMA: Lactic Acid, Venous: >11 mmol/L (ref 0.5–1.9)

## 2019-01-06 LAB — TROPONIN I (HIGH SENSITIVITY)
Troponin I (High Sensitivity): 745 ng/L (ref <18)
Troponin I (High Sensitivity): 1880 ng/L (ref <18)
Troponin I (High Sensitivity): 2477 ng/L (ref <18)

## 2019-01-06 LAB — HEPATIC FUNCTION PANEL
ALT: 127 U/L — ABNORMAL HIGH (ref 0–44)
AST: 622 U/L — ABNORMAL HIGH (ref 15–41)
Albumin: 2.4 g/dL — ABNORMAL LOW (ref 3.5–5.0)
Alkaline Phosphatase: 194 U/L — ABNORMAL HIGH (ref 38–126)
Bilirubin, Direct: 2.2 mg/dL — ABNORMAL HIGH (ref 0.0–0.2)
Indirect Bilirubin: 1.5 mg/dL — ABNORMAL HIGH (ref 0.3–0.9)
Total Bilirubin: 3.7 mg/dL — ABNORMAL HIGH (ref 0.3–1.2)
Total Protein: 5.6 g/dL — ABNORMAL LOW (ref 6.5–8.1)

## 2019-01-06 LAB — VITAMIN B1: Vitamin B1 (Thiamine): 38.1 nmol/L — ABNORMAL LOW (ref 66.5–200.0)

## 2019-01-06 LAB — CMV DNA, QUANTITATIVE, PCR
CMV DNA Quant: NEGATIVE IU/mL
Log10 CMV Qn DNA Pl: UNDETERMINED log10 IU/mL

## 2019-01-06 LAB — PHOSPHORUS: Phosphorus: 2.6 mg/dL (ref 2.5–4.6)

## 2019-01-06 LAB — PROCALCITONIN: Procalcitonin: 1.43 ng/mL

## 2019-01-06 MED ORDER — DEXTROSE 50 % IV SOLN
INTRAVENOUS | Status: AC
  Filled 2019-01-06: qty 50

## 2019-01-06 MED ORDER — FENTANYL CITRATE (PF) 100 MCG/2ML IJ SOLN
INTRAMUSCULAR | Status: AC
  Administered 2019-01-06: 50 ug via INTRAVENOUS
  Filled 2019-01-06: qty 2

## 2019-01-06 MED ORDER — PREDNISONE 20 MG PO TABS: 30.0000 mg | ORAL_TABLET | Freq: Every day | ORAL | Status: DC

## 2019-01-06 MED ORDER — HEPARIN SODIUM (PORCINE) 5000 UNIT/ML IJ SOLN
5000.0000 [IU] | Freq: Three times a day (TID) | INTRAMUSCULAR | Status: DC
  Administered 2019-01-06 – 2019-01-07 (×2): 5000 [IU] via SUBCUTANEOUS
  Filled 2019-01-06 (×2): qty 1

## 2019-01-06 MED ORDER — SODIUM CHLORIDE 0.9 % IV SOLN
0.0000 ug/min | INTRAVENOUS | Status: DC
  Administered 2019-01-07 (×2): 400 ug/min via INTRAVENOUS
  Administered 2019-01-07: 06:00:00 160 ug/min via INTRAVENOUS
  Administered 2019-01-07: 100 ug/min via INTRAVENOUS
  Filled 2019-01-06 (×4): qty 40

## 2019-01-06 MED ORDER — MORPHINE SULFATE (CONCENTRATE) 10 MG /0.5 ML PO SOLN: 5.0000 mg | ORAL | 0 refills | Status: AC | PRN

## 2019-01-06 MED ORDER — NOREPINEPHRINE 16 MG/250ML-% IV SOLN
0.0000 ug/min | INTRAVENOUS | Status: DC
  Administered 2019-01-06: 23:00:00 20 ug/min via INTRAVENOUS
  Administered 2019-01-07: 40 ug/min via INTRAVENOUS
  Filled 2019-01-06 (×2): qty 250

## 2019-01-06 MED ORDER — MIDAZOLAM HCL 2 MG/2ML IJ SOLN
INTRAMUSCULAR | Status: AC
  Administered 2019-01-06: 2 mg via INTRAVENOUS
  Filled 2019-01-06: qty 2

## 2019-01-06 MED ORDER — FENTANYL 2500MCG IN NS 250ML (10MCG/ML) PREMIX INFUSION
0.0000 ug/h | INTRAVENOUS | Status: DC
  Administered 2019-01-06: 18:00:00 50 ug/h via INTRAVENOUS
  Administered 2019-01-07: 10:00:00 100 ug/h via INTRAVENOUS
  Filled 2019-01-06 (×2): qty 250

## 2019-01-06 MED ORDER — VASOPRESSIN 20 UNIT/ML IV SOLN
0.0300 [IU]/min | INTRAVENOUS | Status: DC
  Administered 2019-01-06: 0.03 [IU]/min via INTRAVENOUS
  Filled 2019-01-06 (×2): qty 2

## 2019-01-06 MED ORDER — SODIUM CHLORIDE 0.9 % IV BOLUS
1000.0000 mL | Freq: Once | INTRAVENOUS | Status: AC
  Administered 2019-01-06: 1000 mL via INTRAVENOUS

## 2019-01-06 MED ORDER — DEXTROSE 10 % IV SOLN
INTRAVENOUS | Status: DC
  Administered 2019-01-06: 22:00:00 via INTRAVENOUS

## 2019-01-06 MED ORDER — NOREPINEPHRINE 4 MG/250ML-% IV SOLN
0.0000 ug/min | INTRAVENOUS | Status: DC
  Administered 2019-01-06 (×2): 45 ug/min via INTRAVENOUS
  Administered 2019-01-06: 40 ug/min via INTRAVENOUS
  Filled 2019-01-06 (×4): qty 250

## 2019-01-06 MED ORDER — LACTATED RINGERS IV SOLN
INTRAVENOUS | Status: DC
  Administered 2019-01-06: 22:00:00 via INTRAVENOUS

## 2019-01-06 MED ORDER — HYDROCORTISONE NA SUCCINATE PF 100 MG IJ SOLR
50.0000 mg | Freq: Four times a day (QID) | INTRAMUSCULAR | Status: DC
  Administered 2019-01-06 – 2019-01-07 (×4): 50 mg via INTRAVENOUS
  Filled 2019-01-06 (×4): qty 2

## 2019-01-06 MED ORDER — MORPHINE SULFATE (CONCENTRATE) 10 MG/0.5ML PO SOLN
5.0000 mg | Freq: Once | ORAL | Status: DC
  Filled 2019-01-06: qty 1

## 2019-01-06 MED ORDER — PIPERACILLIN-TAZOBACTAM 3.375 G IVPB
3.3750 g | Freq: Three times a day (TID) | INTRAVENOUS | Status: DC
  Administered 2019-01-06: 3.375 g via INTRAVENOUS
  Filled 2019-01-06: qty 50

## 2019-01-06 MED ORDER — FUROSEMIDE 10 MG/ML IJ SOLN
20.0000 mg | Freq: Once | INTRAMUSCULAR | Status: AC
  Administered 2019-01-06: 20 mg via INTRAVENOUS
  Filled 2019-01-06: qty 2

## 2019-01-06 MED ORDER — ALBUMIN HUMAN 25 % IV SOLN: 25.0000 g | INTRAVENOUS | Status: DC

## 2019-01-06 MED ORDER — MIDAZOLAM HCL 2 MG/2ML IJ SOLN: 1.0000 mg | INTRAMUSCULAR | Status: DC | PRN

## 2019-01-06 MED ORDER — FENTANYL CITRATE (PF) 100 MCG/2ML IJ SOLN: 25.0000 ug | INTRAMUSCULAR | Status: DC | PRN

## 2019-01-06 MED ORDER — PREDNISONE 10 MG PO TABS: 10.0000 mg | ORAL_TABLET | Freq: Every day | ORAL | 0 refills | Status: AC

## 2019-01-06 MED ORDER — SODIUM CHLORIDE 0.9 % IV SOLN
0.0000 ug/min | INTRAVENOUS | Status: DC
  Administered 2019-01-06: 75 ug/min via INTRAVENOUS
  Administered 2019-01-06: 100 ug/min via INTRAVENOUS
  Administered 2019-01-06: 50 ug/min via INTRAVENOUS
  Filled 2019-01-06 (×3): qty 1

## 2019-01-06 MED ORDER — SODIUM BICARBONATE 8.4 % IV SOLN
INTRAVENOUS | Status: AC
  Administered 2019-01-06: 50 meq via INTRAVENOUS
  Filled 2019-01-06: qty 50

## 2019-01-06 MED ORDER — HYDROMORPHONE HCL 1 MG/ML IJ SOLN
0.2500 mg | INTRAMUSCULAR | Status: DC | PRN
  Administered 2019-01-06: 16:00:00 0.25 mg via INTRAVENOUS
  Filled 2019-01-06: qty 1

## 2019-01-06 MED ORDER — STERILE WATER FOR INJECTION IV SOLN
INTRAVENOUS | Status: DC
  Administered 2019-01-06 – 2019-01-07 (×2): via INTRAVENOUS
  Filled 2019-01-06 (×5): qty 850

## 2019-01-06 MED ORDER — DEXTROSE 5 % IV SOLN: INTRAVENOUS | Status: DC

## 2019-01-06 MED ORDER — SODIUM BICARBONATE 8.4 % IV SOLN
50.0000 meq | Freq: Once | INTRAVENOUS | Status: AC
  Administered 2019-01-06: 17:00:00 50 meq via INTRAVENOUS

## 2019-01-06 MED ORDER — CHLORHEXIDINE GLUCONATE 0.12% ORAL RINSE (MEDLINE KIT)
15.0000 mL | Freq: Two times a day (BID) | OROMUCOSAL | Status: DC
  Administered 2019-01-07: 08:00:00 15 mL via OROMUCOSAL

## 2019-01-06 MED ORDER — FENTANYL CITRATE (PF) 100 MCG/2ML IJ SOLN
25.0000 ug | INTRAMUSCULAR | Status: DC | PRN
  Administered 2019-01-06: 17:00:00 50 ug via INTRAVENOUS

## 2019-01-06 MED ORDER — DEXTROSE 50 % IV SOLN
25.0000 mL | Freq: Once | INTRAVENOUS | Status: AC
  Administered 2019-01-06: 25 mL via INTRAVENOUS

## 2019-01-06 MED ORDER — SODIUM BICARBONATE 8.4 % IV SOLN
100.0000 meq | Freq: Once | INTRAVENOUS | Status: AC
  Administered 2019-01-06: 100 meq via INTRAVENOUS
  Filled 2019-01-06: qty 150

## 2019-01-06 MED ORDER — PIPERACILLIN-TAZOBACTAM 3.375 G IVPB
3.3750 g | Freq: Two times a day (BID) | INTRAVENOUS | Status: DC
  Administered 2019-01-07: 3.375 g via INTRAVENOUS
  Filled 2019-01-06: qty 50

## 2019-01-06 MED ORDER — DEXTROSE 50 % IV SOLN
1.0000 | Freq: Once | INTRAVENOUS | Status: AC
  Administered 2019-01-06: 50 mL via INTRAVENOUS

## 2019-01-06 MED ORDER — ORAL CARE MOUTH RINSE
15.0000 mL | OROMUCOSAL | Status: DC
  Administered 2019-01-07 (×5): 15 mL via OROMUCOSAL

## 2019-01-06 MED ORDER — MIDAZOLAM HCL 2 MG/2ML IJ SOLN
2.0000 mg | Freq: Once | INTRAMUSCULAR | Status: AC
  Administered 2019-01-06: 2 mg via INTRAVENOUS

## 2019-01-06 MED FILL — Medication: Qty: 1 | Status: AC

## 2019-01-06 NOTE — Progress Notes (Signed)
Patient accepted from 1C post cardiac/respiratory arrest.  Patient arrived intubated with 7.5 ETT, 22 at the lip.  Patient hypotensive requiring levophed drip to be started and titrated up to 45 mcg.  Upon arrival Patient continued to be hypotensive requiring neo synephrine to be started. Patient also hypoglycemic upon arrival and amp of d50 administered.  See results for follow up glucose rechecks. Vasopressin started at shock dose as per order.  Orogastric tube inserted and drained 100 ml of brown bile.  Family has been updated and have been at bedside intermittently.

## 2019-01-06 NOTE — Telephone Encounter (Signed)
Patient is being discharged from hospital and wants hospice services. Asking if Dr Tasia Catchings will sign orders and serve as attending

## 2019-01-06 NOTE — Progress Notes (Signed)
New referral for [facility] hospice services at home received from [place]. DME needs discussed prior to visit with patient's daughter [place] by liaison Doreatha Lew. Plan was for discharge home today when DME was in place. Writer spoke in the room with [facility] to discuss hospice services, team approach to care and philosophy, with understanding voiced. Patient seen lying in bed with labored, rapid breathing. Patient did report "some trouble breathing" when asked, patient appeared weak and did not participate much in the conversation. Symptom management was discussed which [name] was agreeable with. She was most concerned "that everything has been stopped because she is going home with hospice". Writer assured her that medications for symptom management would continue. Writer also gently approached code status, [name] did not want a change from Full Code. She also expressed her desire for her mother to get a second opinion once she was home. This brought up concerns about the hospice philosophy and it appeared that goals of care were not clear. Writer asked [name] if she could contact  Oncology Palliative NP Josh Borders to further discuss her mother's care/prognosis/symptom management needs, [name] agreed.  Patient continued to appear uncomfortable and attending Dr. Benjie Karvonen was contacted as well. Patient continued to decline through out the afternoon. She is currently intubated in the ICU, family in discussion, patient remains a full code. Referral updated. Will await family decision/patient outcome. Flo Shanks BSN, RN, [facility] [phone]

## 2019-01-06 NOTE — Progress Notes (Signed)
PT Cancellation Note  Patient Details Name: MAKILA COLOMBE MRN: [id number] DOB: [birth year]   Cancelled Treatment:    Reason Eval/Treat Not Completed: Medical issues which prohibited therapy. Patient's HR is 126 at rest. CI for PT treatment at this time. Patient is tentatively leaving today with Hospice services.    Algie Cales 01/06/2019, 11:16 AM

## 2019-01-06 NOTE — Progress Notes (Signed)
Pharmacy Antibiotic Note  Abigail Ayers is a 73 y.o. female admitted on 12/22/2018 with intra-abdominal.  Pharmacy has been consulted for Zosyn  dosing.  Plan: Zosyn 3.375g IV q8h (4 hour infusion).  Height: 5' 0.5" (153.7 cm) Weight: 169 lb 1.5 oz (76.7 kg) IBW/kg (Calculated) : 46.65  Temp (24hrs), Avg:98.1 F (36.7 C), Min:97.6 F (36.4 C), Max:99.1 F (37.3 C)  Recent Labs  Lab 01/02/19 0523 01/03/19 0433 01/03/19 0832 01/04/19 0406 01/05/19 0450 01/06/19 0327  WBC 28.9*  --  31.6* 30.0* 46.3* 57.2*  CREATININE 0.82 0.76  --  0.73 0.67 1.74*    Estimated Creatinine Clearance: 27.5 mL/min (A) (by C-G formula based on SCr of 1.74 mg/dL (H)).    Allergies  Allergen Reactions  . Lisinopril Other (See Comments)    Other reaction(s): Cough    Antimicrobials this admission:   >>    >>   Dose adjustments this admission:   Microbiology results:  BCx:   UCx:    Sputum:    MRSA PCR:   Thank you for allowing pharmacy to be a part of this patient's care.  Karolyna Bianchini D 01/06/2019 5:09 PM

## 2019-01-06 NOTE — Progress Notes (Signed)
   01/06/19 1600  Clinical Encounter Type  Visited With Patient not available;Health care provider  Visit Type Code  Referral From Physician  Consult/Referral To Chaplain   Code [facility] page received for the patient. Upon arrival, the medical team was assessing the patient's status and administering care. Chaplain maintained pastoral presence outside the patient's room and made the patient's son and daughter aware of my availability. They were communicating with siblings and their pastor. Chaplain waiting to escort the family to ICU waiting at this time.

## 2019-01-06 NOTE — Significant Event (Signed)
Rapid Response Event Note  Overview: Time Called: 9847 Arrival Time: 1350 Event Type: Respiratory  Initial Focused Assessment: Patient found in respiratory distress- sating 85% on nasal cannula.    Interventions: Patient placed on non-rebreather and transferred to ICU.  Patient was intubated by Marda Stalker, NP on arrival. Plan of Care (if not transferred):  Event Summary:   at      at          [facility]

## 2019-01-06 NOTE — Progress Notes (Signed)
PT Cancellation Note  Patient Details Name: SHERRIANN SZUCH MRN: [id number] DOB: [birth year]   Cancelled Treatment:    Reason Eval/Treat Not Completed: Medical issues which prohibited therapy(Per chart review, patient status post CODE BLUE this pm.  Now transferred to CCU, intubated/sedated.  Will complete order at this time.  Please re-consult as medically appropriate and per goals of care.)   Kassidie Hendriks H. Owens Shark, PT, DPT, NCS 01/06/19, 10:18 PM [phone]

## 2019-01-06 NOTE — Progress Notes (Addendum)
New referral received from [place] on Ms. Abigail Ayers room 126 for home with hospice.  DME needs:  Hospital bed, BSC, over bed table, 2 wheeled walker, transport wheelchair &prn oxygen.  I called patient's daughter, Abigail Ayers at [phone] and explained hospice services.  This is the best number for DME company to call. Patient plans discharge today after DME is delivered. Mel Almond will ask MD to place home oxygen order in the system. Hospital information faxed to referral intake.  Thank your for allowing [place] to participate in this patient's care.  Ht 5'5"  Wt-  167#  BMI- 32.48

## 2019-01-06 NOTE — ED Provider Notes (Signed)
[facility] Department of Emergency Medicine   Code Blue CONSULT NOTE  Chief Complaint: Cardiac arrest/unresponsive   Level V Caveat: Unresponsive  History of present illness: I was contacted by the hospital for a CODE BLUE cardiac arrest upstairs and presented to the patient's bedside.  On arrival, I found the patient pulseless, apneic.  CPR had been started for 2 cycles with 1 dose of epinephrine.  On discussion with nursing, the patient reportedly was recently transferred from the ICU for sepsis.  She had been increasingly tachypneic and anxious throughout the day.  She was given a dose of pain medication and then was noted to become less responsive, followed by what sounds like a PEA arrest.  No other physicians at bedside on my arrival.  Patient CPR continued, with good BVM ventilation via respiratory.  Additional dose of epinephrine and bicarb given by myself due to concern for hypoxic, hypercapnic respiratory failure with subsequent respiratory acidosis.  ROS: Unable to obtain, Level V caveat  Scheduled Meds: . chlorhexidine  15 mL Mouth Rinse BID  . heparin injection (subcutaneous)  5,000 Units Subcutaneous Q8H  . hydrocortisone sod succinate (SOLU-CORTEF) inj  50 mg Intravenous Q6H  . mouth rinse  15 mL Mouth Rinse q12n4p  . pantoprazole (PROTONIX) IV  40 mg Intravenous Q12H  . [START ON 2019-02-01] predniSONE  30 mg Oral Q breakfast  . sodium chloride flush  10-40 mL Intracatheter Q12H   Continuous Infusions: . albumin human    . dextrose 5% lactated ringers 100 mL/hr at 01/06/19 1800  . fentaNYL infusion INTRAVENOUS 50 mcg/hr (01/06/19 1800)  . norepinephrine (LEVOPHED) Adult infusion 40 mcg/min (01/06/19 1931)  . phenylephrine (NEO-SYNEPHRINE) Adult infusion 50 mcg/min (01/06/19 1942)  . [START ON 02-01-2019] piperacillin-tazobactam (ZOSYN)  IV    .  sodium bicarbonate (isotonic) infusion in sterile water 100 mL/hr at 01/06/19 2017  . vasopressin  (PITRESSIN) infusion - *FOR SHOCK* 0.03 Units/min (01/06/19 1800)   PRN Meds:.fentaNYL (SUBLIMAZE) injection, fentaNYL (SUBLIMAZE) injection, midazolam, midazolam, ondansetron **OR** ondansetron (ZOFRAN) IV, simethicone, sodium chloride flush Past Medical History:  Diagnosis Date  . Hypertension    not currently taking meds   Past Surgical History:  Procedure Laterality Date  . BREAST BIOPSY Left    neg  . CHOLECYSTECTOMY  1992   Social History   Socioeconomic History  . Marital status: Widowed    Spouse name: Not on file  . Number of children: Not on file  . Years of education: Not on file  . Highest education level: Not on file  Occupational History  . Not on file  Social Needs  . Financial resource strain: Not on file  . Food insecurity    Worry: Not on file    Inability: Not on file  . Transportation needs    Medical: Not on file    Non-medical: Not on file  Tobacco Use  . Smoking status: Never Smoker  . Smokeless tobacco: Never Used  Substance and Sexual Activity  . Alcohol use: No  . Drug use: No  . Sexual activity: Not on file  Lifestyle  . Physical activity    Days per week: Not on file    Minutes per session: Not on file  . Stress: Not on file  Relationships  . Social Herbalist on phone: Not on file    Gets together: Not on file    Attends religious service: Not on file    Active member of club  or organization: Not on file    Attends meetings of clubs or organizations: Not on file    Relationship status: Not on file  . Intimate partner violence    Fear of current or ex partner: Not on file    Emotionally abused: Not on file    Physically abused: Not on file    Forced sexual activity: Not on file  Other Topics Concern  . Not on file  Social History Narrative  . Not on file   Allergies  Allergen Reactions  . Lisinopril Other (See Comments)    Other reaction(s): Cough    Last set of Vital Signs (not current) Vitals:   01/06/19  1830 01/06/19 1934  BP: (!) 98/38   Pulse:    Resp: (!) 25   Temp:    SpO2:  92%      Physical Exam Gen: unresponsive Cardiovascular: pulseless  Resp: apneic. Breath sounds equal bilaterally with bagging  Abd: nondistended  Neuro: GCS 3, unresponsive to pain  HEENT: No blood in posterior pharynx, gag reflex absent  Neck: No crepitus  Musculoskeletal: No deformity  Skin: warm  Procedures (when applicable, including Critical Care time): .Critical Care Performed by: Duffy Bruce, MD Authorized by: Duffy Bruce, MD   Critical care provider statement:    Critical care time (minutes):  35   Critical care time was exclusive of:  Separately billable procedures and treating other patients and teaching time   Critical care was necessary to treat or prevent imminent or life-threatening deterioration of the following conditions:  Cardiac failure and circulatory failure   Critical care was time spent personally by me on the following activities:  Development of treatment plan with patient or surrogate, discussions with consultants, evaluation of patient's response to treatment, examination of patient, obtaining history from patient or surrogate, ordering and performing treatments and interventions, ordering and review of laboratory studies, ordering and review of radiographic studies, pulse oximetry, re-evaluation of patient's condition and review of old charts   I assumed direction of critical care for this patient from another provider in my specialty: no   Procedure Name: Intubation Date/Time: 01/06/2019 8:28 PM Performed by: Duffy Bruce, MD Pre-anesthesia Checklist: Patient identified, Patient being monitored, Emergency Drugs available and Suction available Oxygen Delivery Method: Non-rebreather mask Preoxygenation: Pre-oxygenation with 100% oxygen Induction Type: Rapid sequence Ventilation: Mask ventilation without difficulty Laryngoscope Size: Mac and 4 Grade View: Grade  I Tube size: 7.0 mm Number of attempts: 1 Airway Equipment and Method: Video-laryngoscopy Placement Confirmation: ETT inserted through vocal cords under direct vision,  CO2 detector and Breath sounds checked- equal and bilateral Secured at: 22 cm Tube secured with: ETT holder Dental Injury: Teeth and Oropharynx as per pre-operative assessment  Difficulty Due To: Difficulty was unanticipated Future Recommendations: Recommend- induction with short-acting agent, and alternative techniques readily available     Cardiopulmonary Resuscitation (CPR) Procedure Note Directed/Performed by: Evonnie Pat I personally directed ancillary staff and/or performed CPR in an effort to regain return of spontaneous circulation and to maintain cardiac, neuro and systemic perfusion.    MDM / Assessment and Plan -Following CPR, Rosc noted with initial A. fib then sinus tachycardia.  Patient intubated by myself uneventfully.  Clinically, concern for possible hypercapnic respiratory failure with subsequent PEA.  I suspect that BVM ventilation and short round of CPR helped resolve this.  ICU team at bedside and will take the patient to the unit.  Post intubation chest x-ray will be obtained on arrival to the unit.  She had equal breath sounds, with good fogging in the ET tube and oxygen sats.       Duffy Bruce, MD 01/06/19 2029

## 2019-01-06 NOTE — Progress Notes (Signed)
CRITICAL CARE NOTE  CC  S/p cardiac arrest respiratory failure  SUBJECTIVE Patient remains critically ill Prognosis is guarded On vasopressors Bicarb infusion On full vent support  Patient with acute cardiac arrest this after noon CPR started family witnessed CPR     BP (!) 144/92   Pulse (!) 122   Temp 99.1 F (37.3 C) (Axillary)   Resp (!) 36   Ht 5' 0.5" (1.537 m)   Wt 76.7 kg   SpO2 95%   BMI 32.48 kg/m    I/O last 3 completed shifts: In: 2941.9 [I.V.:2941.9] Out: 50 [Emesis/NG output:50] No intake/output data recorded.  SpO2: 95 % O2 Flow Rate (L/min): 4 L/min   SIGNIFICANT EVENTS 7/4 admitted to ICU for hepatic encephalopathy HCC with mets 7/9 cardiac arrest and multiorgan failure   REVIEW OF SYSTEMS  PATIENT IS UNABLE TO PROVIDE COMPLETE REVIEW OF SYSTEMS DUE TO SEVERE CRITICAL ILLNESS   PHYSICAL EXAMINATION:  GENERAL:critically ill appearing, +resp distress HEAD: Normocephalic, atraumatic.  EYES: Pupils equal, round, reactive to light.  No scleral icterus.  MOUTH: Moist mucosal membrane. NECK: Supple. No thyromegaly. No nodules. No JVD.  PULMONARY: +rhonchi, +wheezing CARDIOVASCULAR: S1 and S2. Regular rate and rhythm. No murmurs, rubs, or gallops.  GASTROINTESTINAL: Soft, nontender, -distended. No masses. Positive bowel sounds. No hepatosplenomegaly.  MUSCULOSKELETAL: No swelling, clubbing, or edema.  NEUROLOGIC: obtunded, GCS<8 SKIN:intact,warm,dry  MEDICATIONS: I have reviewed all medications and confirmed regimen as documented   CULTURE RESULTS   Recent Results (from the past 240 hour(s))  Gastrointestinal Panel by PCR , Stool     Status: None   Collection Time: 12/29/18  8:51 PM   Specimen: Stool  Result Value Ref Range Status   Campylobacter species NOT DETECTED NOT DETECTED Final   Plesimonas shigelloides NOT DETECTED NOT DETECTED Final   Salmonella species NOT DETECTED NOT DETECTED Final   Yersinia enterocolitica NOT DETECTED  NOT DETECTED Final   Vibrio species NOT DETECTED NOT DETECTED Final   Vibrio cholerae NOT DETECTED NOT DETECTED Final   Enteroaggregative E coli (EAEC) NOT DETECTED NOT DETECTED Final   Enteropathogenic E coli (EPEC) NOT DETECTED NOT DETECTED Final   Enterotoxigenic E coli (ETEC) NOT DETECTED NOT DETECTED Final   Shiga like toxin producing E coli (STEC) NOT DETECTED NOT DETECTED Final   Shigella/Enteroinvasive E coli (EIEC) NOT DETECTED NOT DETECTED Final   Cryptosporidium NOT DETECTED NOT DETECTED Final   Cyclospora cayetanensis NOT DETECTED NOT DETECTED Final   Entamoeba histolytica NOT DETECTED NOT DETECTED Final   Giardia lamblia NOT DETECTED NOT DETECTED Final   Adenovirus F40/41 NOT DETECTED NOT DETECTED Final   Astrovirus NOT DETECTED NOT DETECTED Final   Norovirus GI/GII NOT DETECTED NOT DETECTED Final   Rotavirus A NOT DETECTED NOT DETECTED Final   Sapovirus (I, II, IV, and V) NOT DETECTED NOT DETECTED Final    Comment: Performed at [facility], [address] [place], [place] [address]  C difficile quick scan w PCR reflex     Status: None   Collection Time: 12/29/18  8:51 PM   Specimen: Stool  Result Value Ref Range Status   C Diff antigen NEGATIVE NEGATIVE Final   C Diff toxin NEGATIVE NEGATIVE Final   C Diff interpretation No C. difficile detected.  Final    Comment: Performed at [facility], [address] [place], [place] [address]  Culture, blood (Routine X 2) w Reflex to ID Panel     Status: None (Preliminary result)  Collection Time: 01/02/19 10:59 AM   Specimen: BLOOD  Result Value Ref Range Status   Specimen Description BLOOD LEFT ARM  Final   Special Requests   Final    BOTTLES DRAWN AEROBIC AND ANAEROBIC Blood Culture adequate volume   Culture   Final    NO GROWTH 4 DAYS Performed at [facility], [address] [place], [place] [address]    Report Status PENDING  Incomplete  Culture, blood (Routine X 2) w Reflex to ID  Panel     Status: None (Preliminary result)   Collection Time: 01/02/19 11:27 AM   Specimen: BLOOD  Result Value Ref Range Status   Specimen Description BLOOD BLOOD LEFT FOREARM  Final   Special Requests   Final    BOTTLES DRAWN AEROBIC AND ANAEROBIC Blood Culture adequate volume   Culture   Final    NO GROWTH 4 DAYS Performed at [facility], [address] [place], [place] [address]    Report Status PENDING  Incomplete  SARS Coronavirus 2 (CEPHEID - Performed in [place] hospital lab), Hosp Order     Status: None   Collection Time: 01/04/19  9:28 AM   Specimen: Nasopharyngeal Swab  Result Value Ref Range Status   SARS Coronavirus 2 NEGATIVE NEGATIVE Final    Comment: (NOTE) If result is NEGATIVE SARS-CoV-2 target nucleic acids are NOT DETECTED. The SARS-CoV-2 RNA is generally detectable in upper and lower  respiratory specimens during the acute phase of infection. The lowest  concentration of SARS-CoV-2 viral copies this assay can detect is 250  copies / mL. A negative result does not preclude SARS-CoV-2 infection  and should not be used as the sole basis for treatment or other  patient management decisions.  A negative result may occur with  improper specimen collection / handling, submission of specimen other  than nasopharyngeal swab, presence of viral mutation(s) within the  areas targeted by this assay, and inadequate number of viral copies  (<250 copies / mL). A negative result must be combined with clinical  observations, patient history, and epidemiological information. If result is POSITIVE SARS-CoV-2 target nucleic acids are DETECTED. The SARS-CoV-2 RNA is generally detectable in upper and lower  respiratory specimens dur ing the acute phase of infection.  Positive  results are indicative of active infection with SARS-CoV-2.  Clinical  correlation with patient history and other diagnostic information is  necessary to determine patient infection status.   Positive results do  not rule out bacterial infection or co-infection with other viruses. If result is PRESUMPTIVE POSTIVE SARS-CoV-2 nucleic acids MAY BE PRESENT.   A presumptive positive result was obtained on the submitted specimen  and confirmed on repeat testing.  While 2019 novel coronavirus  (SARS-CoV-2) nucleic acids may be present in the submitted sample  additional confirmatory testing may be necessary for epidemiological  and / or clinical management purposes  to differentiate between  SARS-CoV-2 and other Sarbecovirus currently known to infect humans.  If clinically indicated additional testing with an alternate test  methodology [phone]) is advised. The SARS-CoV-2 RNA is generally  detectable in upper and lower respiratory sp ecimens during the acute  phase of infection. The expected result is Negative. Fact Sheet for Patients:  StrictlyIdeas.no Fact Sheet for Healthcare Providers: BankingDealers.co.za This test is not yet approved or cleared by the [place] FDA and has been authorized for detection and/or diagnosis of SARS-CoV-2 by FDA under an Emergency Use Authorization (EUA).  This EUA will remain  in effect (meaning this test can be used) for the duration of the COVID-19 declaration under Section 564(b)(1) of the Act, 21 U.S.C. section 360bbb-3(b)(1), unless the authorization is terminated or revoked sooner. Performed at [facility], [address] [place], [place] [address]           IMAGING    US Abdomen Limited  Result Date: 01/06/2019 CLINICAL DATA:  Ascites. EXAM: LIMITED ABDOMEN ULTRASOUND FOR ASCITES TECHNIQUE: Limited ultrasound survey for ascites was performed in all four abdominal quadrants. COMPARISON:  Abdomen series 01/04/2019.  MRI 12/31/2018. FINDINGS: Minimal ascites noted. Paracentesis not performed. Repeat exam can be obtained as needed. IMPRESSION: Minimal ascites.  Paracentesis  not performed. Electronically Signed   By: Marcello Moores  Register   On: 01/06/2019 10:09   US Venous Img Lower Bilateral  Result Date: 01/06/2019 CLINICAL DATA:  Lower extremity edema. EXAM: BILATERAL LOWER EXTREMITY VENOUS DOPPLER ULTRASOUND TECHNIQUE: Gray-scale sonography with graded compression, as well as color Doppler and duplex ultrasound were performed to evaluate the lower extremity deep venous systems from the level of the common femoral vein and including the common femoral, femoral, profunda femoral, popliteal and calf veins including the posterior tibial, peroneal and gastrocnemius veins when visible. The superficial great saphenous vein was also interrogated. Spectral Doppler was utilized to evaluate flow at rest and with distal augmentation maneuvers in the common femoral, femoral and popliteal veins. COMPARISON:  No recent. FINDINGS: RIGHT LOWER EXTREMITY Common Femoral Vein: No evidence of thrombus. Normal compressibility, respiratory phasicity and response to augmentation. Saphenofemoral Junction: No evidence of thrombus. Normal compressibility and flow on color Doppler imaging. Profunda Femoral Vein: No evidence of thrombus. Normal compressibility and flow on color Doppler imaging. Femoral Vein: No evidence of thrombus. Normal compressibility, respiratory phasicity and response to augmentation. Popliteal Vein: No evidence of thrombus. Normal compressibility, respiratory phasicity and response to augmentation. Calf Veins: No evidence of thrombus. Normal compressibility and flow on color Doppler imaging. Superficial Great Saphenous Vein: No evidence of thrombus. Normal compressibility. Other Findings:  Due to edema. LEFT LOWER EXTREMITY Common Femoral Vein: No evidence of thrombus. Normal compressibility, respiratory phasicity and response to augmentation. Saphenofemoral Junction: No evidence of thrombus. Normal compressibility and flow on color Doppler imaging. Profunda Femoral Vein: No evidence of  thrombus. Normal compressibility and flow on color Doppler imaging. Femoral Vein: No evidence of thrombus. Normal compressibility, respiratory phasicity and response to augmentation. Popliteal Vein: No evidence of thrombus. Normal compressibility, respiratory phasicity and response to augmentation. Calf Veins: No evidence of thrombus. Normal compressibility and flow on color Doppler imaging. Superficial Great Saphenous Vein: No evidence of thrombus. Normal compressibility. Other Findings:  Exam limited due to edema. IMPRESSION: No evidence of deep venous thrombosis in either lower extremity. 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  Patient with acute cardiac arrest and cardiogenic shock    Severe ACUTE Hypoxic and Hypercapnic Respiratory Failure from acute cardiac arrest  with cardiogenic shock -continue Full MV support -continue Bronchodilator Therapy -Wean Fio2 and PEEP as tolerated  ACUTE CARDIAC ARREST Vasopressors as needed   ACUTE KIDNEY INJURY/Renal Failure -follow chem 7 -follow UO -continue Foley Catheter-assess need -Avoid nephrotoxic agents  NEUROLOGY Progressive encephalopathy Liver failure   SHOCK-SEPSIS/HYPOVOLUMIC/CARDIOGENIC -use vasopressors to keep MAP>65 -follow ABG and LA -follow up cultures -emperic ABX -consider stress dose steroids -aggressive  IV fluid resuscitation  CARDIAC ICU monitoring  ID -continue IV abx as prescibed -follow up cultures  GI-severe abd distention ?ischemic colitis ?SBP GI PROPHYLAXIS as indicated Start zosyn  NUTRITIONAL STATUS DIET-->NPO, place OGT Constipation protocol as indicated  ENDO - will use ICU hypoglycemic\Hyperglycemia protocol if indicated   ELECTROLYTES -follow labs as needed -replace as needed -pharmacy consultation and following   DVT/GI PRX ordered TRANSFUSIONS AS NEEDED MONITOR FSBS ASSESS the need for LABS as needed   Critical Care Time devoted to patient care services described in this note is 47 minutes.   Overall, patient is critically ill, prognosis is guarded.  Patient with Multiorgan failure and at high risk for cardiac arrest and death.   Recommend DNR status and comfort care measures Patient is dying and suffereing  Corrin Parker, M.D.  [facility]  Medical Director [place] Director [facility] Cardio-Pulmonary Department

## 2019-01-06 NOTE — Procedures (Signed)
Central Venous Catheter Placement:TRIPLE LUMEN Indication: Patient receiving vesicant or irritant drug.; Patient receiving intravenous therapy for longer than 5 days.; Patient has limited or no vascular access.   Consent:emergent    Hand washing performed prior to starting the procedure.   Procedure:   An active timeout was performed and correct patient, name, & ID confirmed.   Patient was positioned correctly for central venous access.  Patient was prepped using strict sterile technique including chlorohexadine preps, sterile drape, sterile gown and sterile gloves.    The area was prepped, draped and anesthetized in the usual sterile manner. Patient comfort was obtained.    A triple lumen catheter was placed in RT femoral  Vein There was good blood return, catheter caps were placed on lumens, catheter flushed easily, the line was secured and a sterile dressing and BIO-PATCH applied.   Ultrasound was used to visualize vasculature and guidance of needle.   Number of Attempts: 1 Complications:none Estimated Blood Loss: none Chest Radiograph indicated and ordered.  Operator: Finlay Mills.   Corrin Parker, M.D.  [facility]  Medical Director [place] Director [facility] Cardio-Pulmonary Department

## 2019-01-06 NOTE — Progress Notes (Signed)
At 1050 this morning pt BP was 80/58, Pulse 124, and Resp 22. 91% O2 on 3L. Manual BP recheck of pt was 90/60. Pt breathing was labored, but otherwise pt was asymptomatic. Pt O2 bumped up to 4L and pt repositioned. MD made aware of VS via secure chat. MD response was "going home with hospice". Spoke with pt daughter about her concerns of pt and what were going to do about pt breathing because she did not feel safe taking pt home with breathing that labored.   @1122  another secure chat message was sent to MD asking for her to come talk to daughter because she had questions about procedures such as a paracentesis that was not being done and medications that pt was supposed to have that I could not answer. MD responded with "no ascites on ultrasound". I asked the daughter to be patient with me and I would try to get MD or someone else to come in and speak with her about her concerns. Daughter was OK with this at that time. I asked Flo Shanks RN from [facility] if she would mind speaking with family in hopes of clarifying any questions that pertained to hospice. At 1336 I spoke with pt daughter again and she was still frustrated with the fact that the doctor had not been in to see her or called to speak with her. Another message was sent to MD. MD called in response to this message and said to give pt 20 mg of lasix IV. At this time NP Josh Borders was assessing pt and it was decided to hold lasix and speak to MD again. MD paged, no response. NP Josh Borders put in an order for Q4h PRN Hydromorphone for dyspnea and gave verbal order to give lasix as originally ordered per MD. These medications were given to pt as she was still dyspneic. At approximately 1600 this nurse called a rapid response because pt was breathing shallowly at 8 breaths per minute and pt not responding at that time. This nurse called a Code Blue approximately three minutes later due to pt unresponsiveness and pulseless. CPR started.

## 2019-01-06 NOTE — Progress Notes (Signed)
CRITICAL VALUE STICKER  CRITICAL VALUE:WBC 57.2  RECEIVER (on-site recipient of call):Tasnim Balentine RN  DATE & TIME NOTIFIED: 01/06/19  0515  MESSENGER (representative from lab):Jarrett Soho  MD NOTIFIED: Dr Marcille Blanco  TIME OF NOTIFICATION:01/06/19 7841  RESPONSE: pending

## 2019-01-06 NOTE — Procedures (Signed)
Arterial Catheter Insertion Procedure Note Abigail Ayers [id number] May 15, 1946  Procedure: Insertion of Arterial Catheter  Indications: Blood pressure monitoring and Frequent blood sampling  Procedure Details Consent: Unable to obtain consent because of emergent medical necessity. Time Out: Verified patient identification, verified procedure, site/side was marked, verified correct patient position, special equipment/implants available, medications/allergies/relevent history reviewed, required imaging and test results available.  Performed  Maximum sterile technique was used including antiseptics, cap, gloves, gown, hand hygiene, mask and sheet. Skin prep: Chlorhexidine; local anesthetic administered 20 gauge catheter was inserted into left femoral artery using the Seldinger technique. ULTRASOUND GUIDANCE USED: YES Evaluation Blood flow good; BP tracing good. Complications: No apparent complications.  Procedure was performed using Ultrasound for direct visualization of cannulization of Left Femoral Artery.   Abigail Ayers, AGACNP-BC [facility] Pager: [phone] Cell: [phone]   Abigail Ayers 01/06/2019

## 2019-01-06 NOTE — Progress Notes (Signed)
Family At bedside, clinical status relayed to family  Updated and notified of patients medical condition-  Progressive multiorgan failure with very low chance of meaningful recovery.  Patient is in dying  Process.  Family understands the situation.  Patient remains FULL CODE AT THIS TIME  Family is not ready to make any decisions at this time  Family are satisfied with Plan of action and management. All questions answered  Corrin Parker, M.D.  [facility]  Medical Director [place] Director [facility] Cardio-Pulmonary Department

## 2019-01-06 NOTE — Progress Notes (Addendum)
[place]  [phone] [phone]   Name: Abigail Ayers Date: 01/06/2019 MRN: [id number]  DOB: 11/25/1945  Patient Care Team: Sharyne Peach, MD as PCP - General (Family Medicine) Bary Castilla, Forest Gleason, MD (General Surgery) Sharyne Peach, MD (Family Medicine) Clent Jacks, RN as Registered Nurse    REASON FOR CONSULTATION: Palliative Care consult requested for [phone] y.o.femalewith multiple medical problems including locally advanced HCC currently on treatment withatezolizumab plus bevacizumab (last treated on 12/23/2018).PMH also notable for history of HCV status post Harvoni treatment, and portal vein thrombus.  Patient was admitted to the hospital on 12/18/2018 with altered mental status and was found to have presumed sepsis from unclear source.  She was also found to have multiorgan dysfunction with renal antibiotic involvement. Patient was referred to palliative care to help address symptoms and goals.  CODE STATUS: Full code  PAST MEDICAL HISTORY: Past Medical History:  Diagnosis Date   Hypertension    not currently taking meds    PAST SURGICAL HISTORY:  Past Surgical History:  Procedure Laterality Date   BREAST BIOPSY Left    neg   CHOLECYSTECTOMY  1992    HEMATOLOGY/ONCOLOGY HISTORY:  Oncology History  Hepatocellular carcinoma ([place])  12/16/2018 Initial Diagnosis   Hepatocellular carcinoma ([place])   12/23/2018 -  Chemotherapy   The patient had bevacizumab (AVASTIN) 1,200 mg in sodium chloride 0.9 % 100 mL chemo infusion, 15 mg/kg = 1,200 mg, Intravenous,  Once, 1 of 6 cycles Administration: 1,200 mg (12/23/2018) atezolizumab (TECENTRIQ) 1,200 mg in sodium chloride 0.9 % 250 mL chemo infusion, 1,200 mg, Intravenous, Once, 1 of 6 cycles Administration: 1,200 mg (12/23/2018)  for chemotherapy treatment.    12/25/2018 Cancer Staging   Staging form: Liver, AJCC 8th Edition - Clinical:  Stage IIIB (cT4, cN0, cM0) - Signed by Earlie Server, MD on 12/25/2018     ALLERGIES:  is allergic to lisinopril.  MEDICATIONS:  Current Facility-Administered Medications  Medication Dose Route Frequency Provider Last Rate Last Dose   albumin human 25 % solution 25 g  25 g Intravenous See admin instructions Earlie Server, MD       chlorhexidine (PERIDEX) 0.12 % solution 15 mL  15 mL Mouth Rinse BID Tukov-Yual, Magdalene S, NP   15 mL at 01/05/19 2157   dextrose 5 % in lactated ringers infusion   Intravenous Continuous Bettey Costa, MD   Stopped at 01/06/19 1107   dextrose 50 % solution 50 mL  1 ampule Intravenous Once Awilda Bill, NP       fentaNYL (SUBLIMAZE) 100 MCG/2ML injection            fentaNYL (SUBLIMAZE) injection 25 mcg  25 mcg Intravenous Q15 min PRN Awilda Bill, NP       fentaNYL (SUBLIMAZE) injection 25-100 mcg  25-100 mcg Intravenous Q30 min PRN Awilda Bill, NP       heparin injection 5,000 Units  5,000 Units Subcutaneous Q8H Mody, Sital, MD       hydrocortisone sodium succinate (SOLU-CORTEF) 100 MG injection 50 mg  50 mg Intravenous Q6H Kasa, Kurian, MD       MEDLINE mouth rinse  15 mL Mouth Rinse q12n4p Tukov-Yual, Magdalene S, NP   15 mL at 01/03/19 1523   midazolam (VERSED) 2 MG/2ML injection            midazolam (VERSED) injection 1 mg  1 mg Intravenous Q15 min PRN Awilda Bill, NP  midazolam (VERSED) injection 1 mg  1 mg Intravenous Q2H PRN Awilda Bill, NP       midazolam (VERSED) injection 2 mg  2 mg Intravenous Once Awilda Bill, NP       norepinephrine (LEVOPHED) 2m in 253mpremix infusion  0-40 mcg/min Intravenous Titrated BlAwilda BillNP       ondansetron (ZOFRAN) tablet 4 mg  4 mg Oral Q6H PRN PySaundra ShellingMD       Or   ondansetron (ZOFRAN) injection 4 mg  4 mg Intravenous Q6H PRN PySaundra ShellingMD   4 mg at 01/06/19 0031   pantoprazole (PROTONIX) injection 40 mg  40 mg Intravenous Q12H BlAwilda BillNP   40 mg  at 01/05/19 2157   phenylephrine (NEO-SYNEPHRINE) 10 mg in sodium chloride 0.9 % 250 mL (0.04 mg/mL) infusion  0-400 mcg/min Intravenous Titrated BlAwilda BillNP       [SDerrill MemoN 7/12-Jul-2020predniSONE (DELTASONE) tablet 30 mg  30 mg Oral Q breakfast MoBettey CostaMD       simethicone (MYLICON) chewable tablet 80 mg  80 mg Oral Q6H PRN BlAwilda BillNP   80 mg at 01/04/19 1550   sodium bicarbonate 1 mEq/mL injection            sodium bicarbonate injection 50 mEq  50 mEq Intravenous Once BlAwilda BillNP       sodium chloride flush (NS) 0.9 % injection 10-40 mL  10-40 mL Intracatheter Q12H SuHillary BowMD   10 mL at 01/05/19 2158   sodium chloride flush (NS) 0.9 % injection 10-40 mL  10-40 mL Intracatheter PRN SuHillary BowMD       vasopressin (PITRESSIN) 40 Units in sodium chloride 0.9 % 250 mL (0.16 Units/mL) infusion  0.03 Units/min Intravenous Continuous BlAwilda BillNP        VITAL SIGNS: BP (!) 144/92    Pulse (!) 122    Temp 99.1 F (37.3 C) (Axillary)    Resp (!) 36    Ht 5' 0.5" (1.537 m)    Wt 169 lb 1.5 oz (76.7 kg)    SpO2 95%    BMI 32.48 kg/m  Filed Weights   12/22/2018 1400 12/28/18 0400 01/02/19 0534  Weight: 162 lb 11.2 oz (73.8 kg) 165 lb 2 oz (74.9 kg) 169 lb 1.5 oz (76.7 kg)    Estimated body mass index is 32.48 kg/m as calculated from the following:   Height as of this encounter: 5' 0.5" (1.537 m).   Weight as of this encounter: 169 lb 1.5 oz (76.7 kg).  LABS: CBC:    Component Value Date/Time   WBC 57.2 (HH) 01/06/2019 0327   HGB 12.0 01/06/2019 0327   HGB 12.6 12/28/2018 0956   HCT 40.4 01/06/2019 0327   PLT 157 01/06/2019 0327   MCV 81.9 01/06/2019 0327   NEUTROABS 24.9 (H) 01/02/2019 0523   LYMPHSABS 1.2 01/02/2019 0523   MONOABS 1.2 (H) 01/02/2019 0523   EOSABS 0.0 01/02/2019 0523   BASOSABS 0.1 01/02/2019 0523   Comprehensive Metabolic Panel:    Component Value Date/Time   NA 152 (H) 01/06/2019 0327   K 3.7 01/06/2019  0327   CL 116 (H) 01/06/2019 0327   CO2 20 (L) 01/06/2019 0327   BUN 28 (H) 01/06/2019 0327   CREATININE 1.74 (H) 01/06/2019 0327   GLUCOSE 154 (H) 01/06/2019 0327   CALCIUM 8.9 01/06/2019 0327   AST 622 (H) 01/06/2019  0327   ALT 127 (H) 01/06/2019 0327   ALKPHOS 194 (H) 01/06/2019 0327   BILITOT 3.7 (H) 01/06/2019 0327   PROT 5.6 (L) 01/06/2019 0327   ALBUMIN 2.4 (L) 01/06/2019 0327    RADIOGRAPHIC STUDIES: Dg Abd 1 View  Result Date: 01/04/2019 CLINICAL DATA:  Nausea, vomiting, abdominal pain EXAM: ABDOMEN - 1 VIEW COMPARISON:  CT abdomen 11/05/2018 FINDINGS: There is mild gaseous distension of the small bowel. There is no bowel dilatation to suggest obstruction. There is no evidence of pneumoperitoneum, portal venous gas or pneumatosis. There are no pathologic calcifications along the expected course of the ureters. The osseous structures are unremarkable. IMPRESSION: No acute abdominal abnormality. Electronically Signed   By: Kathreen Devoid   On: 01/04/2019 11:53   Ct Head Wo Contrast  Result Date: 11/30/2018 CLINICAL DATA:  Liver carcinoma with altered mental status/confusion EXAM: CT HEAD WITHOUT CONTRAST TECHNIQUE: Contiguous axial images were obtained from the base of the skull through the vertex without intravenous contrast. COMPARISON:  None. FINDINGS: Brain: Ventricles and sulci are within normal limits for age. There is no demonstrable intracranial mass, hemorrhage, extra-axial fluid collection, or midline shift. There is slight small vessel disease in the centra semiovale bilaterally. No acute infarct evident. Vascular: No hyperdense vessel. There are foci of calcification in each carotid siphon region. Skull: The bony calvarium appears intact. No neoplastic focus involving bony structures. Sinuses/Orbits: Visualized paranasal sinuses are clear. Visualized orbits appear symmetric bilaterally. Other: Mastoid air cells are clear. IMPRESSION: Mild periventricular small vessel disease. No  acute infarct. No mass or hemorrhage. There are foci of arterial vascular calcification. Electronically Signed   By: Lowella Grip III M.D.   On: 11/30/2018 09:46   Ct Chest Wo Contrast  Result Date: 12/15/2018 CLINICAL DATA:  Hepatocellular carcinoma staging. Chest pain. Portal vein thrombosis. EXAM: CT CHEST WITHOUT CONTRAST TECHNIQUE: Multidetector CT imaging of the chest was performed following the standard protocol without IV contrast. COMPARISON:  Abdominal CT 11/05/2018.  No prior chest imaging. FINDINGS: Cardiovascular: Aortic atherosclerosis. Tortuous thoracic aorta. Borderline cardiomegaly. Multivessel coronary artery atherosclerosis. Mediastinum/Nodes: No supraclavicular adenopathy. No mediastinal or definite hilar adenopathy, given limitations of unenhanced CT. Lungs/Pleura: Trace right pleural fluid. No suspicious pulmonary nodule or mass. Areas of bilateral major fissure thickening versus small subpleural lymph nodes. A right apical subpleural 3 mm nodule on image 29/3 is also most likely a subpleural lymph node. Upper Abdomen: Cirrhosis with heterogeneity throughout the liver, better evaluated on contrast-enhanced studies. New small volume ascites since 11/05/2018. Musculoskeletal: Thoracic spondylosis. IMPRESSION: 1.  No acute process or evidence of metastatic disease in the chest. 2. Coronary artery atherosclerosis. Aortic Atherosclerosis (ICD10-I70.0). 3. Trace right pleural fluid. 4. New small volume ascites. Electronically Signed   By: Abigail Miyamoto M.D.   On: 12/15/2018 08:51   Mr Brain Wo Contrast  Result Date: 12/31/2018 CLINICAL DATA:  73 year old female with altered mental status, suspected sepsis. Liver tumor. EXAM: MRI HEAD WITHOUT CONTRAST TECHNIQUE: Multiplanar, multiecho pulse sequences of the brain and surrounding structures were obtained without intravenous contrast. COMPARISON:  Head CT 12/07/2018 and earlier. FINDINGS: Study is intermittently degraded by motion artifact  despite repeated imaging attempts. Brain: No restricted diffusion to suggest acute infarction. No midline shift, mass effect, evidence of mass lesion, ventriculomegaly, extra-axial collection or acute intracranial hemorrhage. Cervicomedullary junction and pituitary are within normal limits. Scattered bilateral cerebral white matter T2 and FLAIR hyperintensity, mild to moderate for age. No cortical encephalomalacia or chronic cerebral blood products identified. No  intrinsic increased T1 signal in the deep gray matter nuclei. The basal ganglia, thalami, brainstem, and cerebellum appear normal for age. Vascular: Major intracranial vascular flow voids are preserved. Skull and upper cervical spine: Negative visible cervical spine. Visible bone marrow signal appears within normal limits. Sinuses/Orbits: Negative orbits. Paranasal sinuses and mastoids are stable and well pneumatized. Other: Scalp and face soft tissues appear negative. IMPRESSION: 1.  No acute intracranial abnormality. 2. Mild to moderate for age cerebral white matter signal changes, most commonly due to chronic small vessel disease. Electronically Signed   By: Genevie Ann M.D.   On: 12/31/2018 16:09   Mr 3d Recon At Scanner  Result Date: 12/31/2018 CLINICAL DATA:  Inpatient. 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 Recent initiation of chemotherapy. Patient admitted with abnormal liver function tests, sepsis, altered mental status. EXAM: MRI ABDOMEN WITHOUT AND WITH CONTRAST (INCLUDING MRCP) TECHNIQUE: Multiplanar multisequence MR imaging of the abdomen was performed both before and after the administration of intravenous contrast. Heavily T2-weighted images of the biliary and pancreatic ducts were obtained, and three-dimensional MRCP images were rendered by post processing. CONTRAST:  7 cc Gadavist IV. COMPARISON:  11/11/2018 MRI abdomen. FINDINGS: Lower chest: Small dependent bilateral pleural  effusions, right greater than left, new from prior MRI. Passive atelectasis at the dependent lung bases bilaterally. Hepatobiliary: There are multiple heterogeneously enhancing poorly defined masses scattered throughout the right greater than left liver lobes, most of which are new since 11/11/2018 MRI. For example, right liver dome 2.5 x 2.0 cm mass (series 19/image 18), segment 7 right liver lobe 2.7 x 2.2 cm mass (series 19/image 24), peripheral right liver lobe 3.6 x 2.0 cm mass (series 19/image 28) and segment 2 left liver lobe 1.7 x 1.1 cm mass (series 19/image 29) are all new. Segment 8 right liver lobe 2.4 x 2.1 cm mass (series 19/image 22), previously 2.4 x 2.4 cm, not appreciably changed. 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 Cholecystectomy. No biliary ductal dilatation. Common bile duct diameter 4 mm. No evidence of choledocholithiasis. Pancreas: Mild diffuse peripancreatic edema. No pancreatic mass or duct dilation. Spleen: Normal size. No mass. Adrenals/Urinary Tract: Normal adrenals. No hydronephrosis. Normal kidneys with no renal mass. Stomach/Bowel: Normal non-distended stomach. Normal caliber visualized bowel loops. Generalized wall thickening in visualized small and large bowel. Vascular/Lymphatic: Nonaneurysmal abdominal aorta. Patent hepatic, splenic and renal veins. Patent SMV. No pathologically enlarged lymph nodes in the abdomen. Other: Small volume perihepatic ascites.  No focal fluid collection. Musculoskeletal: No aggressive appearing focal osseous lesions. IMPRESSION: 1. Several heterogeneously enhancing poorly defined liver masses scattered throughout the right greater than left liver lobes, most of which are new since 11/11/2018 MRI. Findings are compatible with progression of multifocal hepatocellular carcinoma. Previously visualized  solitary segment 8 right liver mass is stable. 2. 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 3. Small volume perihepatic ascites. 4. Generalized bowel wall thickening and mild peripancreatic edema, nonspecific, potentially due to a portal hypertension and/or hypoproteinemia. 5. Small dependent bilateral pleural effusions, right greater than left, new. Electronically Signed   By: Ilona Sorrel M.D.   On: 12/31/2018 19:46   US Abdomen Limited  Result Date: 01/06/2019 CLINICAL DATA:  Ascites. EXAM: LIMITED ABDOMEN ULTRASOUND FOR ASCITES TECHNIQUE: Limited ultrasound survey for ascites was performed in all  four abdominal quadrants. COMPARISON:  Abdomen series 01/04/2019.  MRI 12/31/2018. FINDINGS: Minimal ascites noted. Paracentesis not performed. Repeat exam can be obtained as needed. IMPRESSION: Minimal ascites.  Paracentesis not performed. Electronically Signed   By: Marcello Moores  Register   On: 01/06/2019 10:09   US Venous Img Lower Bilateral  Result Date: 01/06/2019 CLINICAL DATA:  Lower extremity edema. EXAM: BILATERAL LOWER EXTREMITY VENOUS DOPPLER ULTRASOUND TECHNIQUE: Gray-scale sonography with graded compression, as well as color Doppler and duplex ultrasound were performed to evaluate the lower extremity deep venous systems from the level of the common femoral vein and including the common femoral, femoral, profunda femoral, popliteal and calf veins including the posterior tibial, peroneal and gastrocnemius veins when visible. The superficial great saphenous vein was also interrogated. Spectral Doppler was utilized to evaluate flow at rest and with distal augmentation maneuvers in the common femoral, femoral and popliteal veins. COMPARISON:  No recent. FINDINGS: RIGHT LOWER EXTREMITY Common Femoral Vein: No evidence of thrombus. Normal compressibility, respiratory phasicity and response to augmentation. Saphenofemoral Junction: No evidence  of thrombus. Normal compressibility and flow on color Doppler imaging. Profunda Femoral Vein: No evidence of thrombus. Normal compressibility and flow on color Doppler imaging. Femoral Vein: No evidence of thrombus. Normal compressibility, respiratory phasicity and response to augmentation. Popliteal Vein: No evidence of thrombus. Normal compressibility, respiratory phasicity and response to augmentation. Calf Veins: No evidence of thrombus. Normal compressibility and flow on color Doppler imaging. Superficial Great Saphenous Vein: No evidence of thrombus. Normal compressibility. Other Findings:  Due to edema. LEFT LOWER EXTREMITY Common Femoral Vein: No evidence of thrombus. Normal compressibility, respiratory phasicity and response to augmentation. Saphenofemoral Junction: No evidence of thrombus. Normal compressibility and flow on color Doppler imaging. Profunda Femoral Vein: No evidence of thrombus. Normal compressibility and flow on color Doppler imaging. Femoral Vein: No evidence of thrombus. Normal compressibility, respiratory phasicity and response to augmentation. Popliteal Vein: No evidence of thrombus. Normal compressibility, respiratory phasicity and response to augmentation. Calf Veins: No evidence of thrombus. Normal compressibility and flow on color Doppler imaging. Superficial Great Saphenous Vein: No evidence of thrombus. Normal compressibility. Other Findings:  Exam limited due to edema. IMPRESSION: No evidence of deep venous thrombosis in either lower extremity. Electronically Signed   By: Marcello Moores  Register   On: 01/06/2019 10:46   Dg Chest Port 1 View  Result Date: 12/24/2018 CLINICAL DATA:  73 year old female with history of confusion and disorientation after initial doses of chemotherapy for liver cancer. EXAM: PORTABLE CHEST 1 VIEW COMPARISON:  No priors. FINDINGS: Lung volumes are low. Linear opacities in lung bases bilaterally favored to reflect areas of subsegmental atelectasis or  scarring. No consolidative airspace disease. No pleural effusions. No evidence of pulmonary edema. Heart size is normal. The patient is rotated to the left on today's exam, resulting in distortion of the mediastinal contours and reduced diagnostic sensitivity and specificity for mediastinal pathology. Aortic atherosclerosis. IMPRESSION: 1. Low lung volumes with bibasilar areas of atelectasis and/or scarring (left greater than right). 2. Aortic atherosclerosis. Electronically Signed   By: Vinnie Langton M.D.   On: 12/18/2018 09:13   Mr Abdomen Mrcp Moise Boring Contast  Result Date: 12/31/2018 CLINICAL DATA:  Inpatient. 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 Recent initiation of chemotherapy. Patient admitted with abnormal liver function tests, sepsis, altered mental status. EXAM: MRI ABDOMEN WITHOUT AND WITH CONTRAST (INCLUDING MRCP) TECHNIQUE: Multiplanar multisequence MR imaging of the abdomen was performed both before and after the administration of intravenous  contrast. Heavily T2-weighted images of the biliary and pancreatic ducts were obtained, and three-dimensional MRCP images were rendered by post processing. CONTRAST:  7 cc Gadavist IV. COMPARISON:  11/11/2018 MRI abdomen. FINDINGS: Lower chest: Small dependent bilateral pleural effusions, right greater than left, new from prior MRI. Passive atelectasis at the dependent lung bases bilaterally. Hepatobiliary: There are multiple heterogeneously enhancing poorly defined masses scattered throughout the right greater than left liver lobes, most of which are new since 11/11/2018 MRI. For example, right liver dome 2.5 x 2.0 cm mass (series 19/image 18), segment 7 right liver lobe 2.7 x 2.2 cm mass (series 19/image 24), peripheral right liver lobe 3.6 x 2.0 cm mass (series 19/image 28) and segment 2 left liver lobe 1.7 x 1.1 cm mass (series 19/image 29) are all new. Segment 8 right liver lobe 2.4 x 2.1 cm  mass (series 19/image 22), previously 2.4 x 2.4 cm, not appreciably changed. 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 Cholecystectomy. No biliary ductal dilatation. Common bile duct diameter 4 mm. No evidence of choledocholithiasis. Pancreas: Mild diffuse peripancreatic edema. No pancreatic mass or duct dilation. Spleen: Normal size. No mass. Adrenals/Urinary Tract: Normal adrenals. No hydronephrosis. Normal kidneys with no renal mass. Stomach/Bowel: Normal non-distended stomach. Normal caliber visualized bowel loops. Generalized wall thickening in visualized small and large bowel. Vascular/Lymphatic: Nonaneurysmal abdominal aorta. Patent hepatic, splenic and renal veins. Patent SMV. No pathologically enlarged lymph nodes in the abdomen. Other: Small volume perihepatic ascites.  No focal fluid collection. Musculoskeletal: No aggressive appearing focal osseous lesions. IMPRESSION: 1. Several heterogeneously enhancing poorly defined liver masses scattered throughout the right greater than left liver lobes, most of which are new since 11/11/2018 MRI. Findings are compatible with progression of multifocal hepatocellular carcinoma. Previously visualized solitary segment 8 right liver mass is stable. 2. 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 3. Small volume perihepatic ascites. 4. Generalized bowel wall thickening and mild peripancreatic edema, nonspecific, potentially due to a portal hypertension and/or hypoproteinemia. 5. Small dependent bilateral pleural effusions, right greater than left, new. Electronically Signed   By: Ilona Sorrel M.D.   On: 12/31/2018 19:46   US Abdomen Limited Ruq  Result Date: 11/30/2018 CLINICAL DATA:  Hepatocellular carcinoma with tumor thrombus shown on prior imaging.  Elevated liver enzymes. EXAM: ULTRASOUND ABDOMEN LIMITED RIGHT UPPER QUADRANT COMPARISON:  11/11/2018 MRI FINDINGS: Gallbladder: Surgically absent Common bile duct: Diameter: 5 mm Liver: Heterogeneous right hepatic lobe likely due to underlying tumors and underlying tumor thrombus. There is notable thrombus in the vicinity of the right portal vein and portal vein confluence, with some reconstitution of portal venous flow or flow around the thrombus in the porta hepatis. There is some evidence of retrograde flow in parts of the portal vein before entering the liver for example on image 27 and image 28. There is also some heterogeneity in the left hepatic lobe, for example a 3.7 by 2.8 by 3.4 cm potential lesion posteriorly, and tumor involvement in the left hepatic lobe is not excluded. IMPRESSION: 1. Tumor primarily in the right hepatic lobe and possibly in the left hepatic lobe, with ill definition. 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 There is some retrograde flow in the segment of the portal vein leading towards this thrombus as  shown on image 27. 2. No significant biliary dilatation is identified. Electronically Signed   By: Van Clines M.D.   On: 12/21/2018 17:04    PERFORMANCE STATUS (ECOG) : 4 - Bedbound  Review of Systems Unless otherwise noted, a complete review of systems is negative.  Physical Exam General: critically ill appearing Cardiovascular: tachycardic Pulmonary: labored and rapid breathing, CTA ant fields Abdomen: distended abd, nttp Extremities: Edema BLEs Skin: no rashes Neurological: eyes open, mumbles but difficult to understand, does not follow commands  IMPRESSION: Case discussed this AM with Dr. Tasia Catchings. Family is considering hospice at home with plan for discharge today. I was called to the bedside by the hospice liaison, who was coordinating patient's discharge home. Patient appears to have  declined throughout the day with worsening mental status, worsening hepatic/renal function, leukocytosis and abdominal distension. Patient was taken for abdominal US but was not found to have enough fluid for paracentesis. Patient apparently has also had two mucous/pink bowel movements this afternoon.   Patient seen and examined. She appeared to be uncomfortable with labored breathing and grimacing.   I asked nurse to check vitals. Vitals were: BP 127/77 T 99 Ax HR 120 RR 36 They had difficulty obtaining SpO2 due to cool exts. Patient is on 2L O2.   I spoke with daughter at bedside and then in private. and explained that patient is declining and appears to be approaching end of life. Daughter confirms having spoken with both Dr. Benjie Karvonen and Dr. Tasia Catchings today with recommendation made for hospice care. Daughter verbalized an understanding that patient is not felt to be a treatment candidate for the cancer. Daughter says she wants to ensure that patient is comfortable and had planned to take patient home with hospice but family also plan to seek a second oncology opinion. We discussed in detail the role of hospice in ensuring that patient is comfortable at end of life. We also  talked about the option of keeping patient here for comfort care or sending her to the hospice home. After discussing multiple options, daughter verbalized interest in delaying the discharge today and seeing if there were any other options available for workup or treatment. She also verbalized an interest in patient receiving medication for comfort to try to alleviate patient's pain/dyspnea. We talked about the risk vs benefit of comfort medications including possible sedative effects. Patient has ordered morphine but I would recommend switching to low-dose hydromorphone due to renal failure. Morphine is more likely to be associated with adverse effects from metabolites.  I spoke with patient's daughter regarding code status. She says she  recognizes the probable futility associated with resuscitation but that she is not patient's primary decision maker and would defer that decision to her brother, Abigail Ayers. She asked that I call and speak with Abigail Ayers.   I called and spoke with both Dr. Tasia Catchings and Dr. Benjie Karvonen and we discussed daughter's concerns and that patient appears to be actively declining. Patient has been ordered to receive Lasix 54m due to concerns about volume overload. I also received a verbal order from Dr. MBenjie Karvonento switch from morphine to hydromorphone. We discussed workup regarding abd distension and GI was consulted to evaluate. Plan was to delay discharge and continue to monitor patient.   I called and spoke with patient's son by phone. I updated him on patient's decline. We discussed the possibility that patient is at end of life. He repeatedly stated that he did not want to give up and was not interested  in patient "going home to die." We also discussed code status and he requested that patient remain a full code.   Rapid response was called and then patient lost pulse. She received CPR and ACLS prior to ROSC. Patient was intubated and transferred to ICU for care.   I met with patient's son and daughter. Emotional support provided. Son says he would like patient to remain a full code.   Case discussed with ICU team.   PLAN: -Continue current scope of treatment -Full code   Time Total: 90 minutes  Visit consisted of counseling and education dealing with the complex and emotionally intense issues of symptom management and palliative care in the setting of serious and potentially life-threatening illness.Greater than 50%  of this time was spent counseling and coordinating care related to the above assessment and plan.  Signed by: Altha Harm, PhD, NP-C [phone] (Work Cell)   I reviewed the note of Altha Harm, NP and agree with the documented findings and plan of care. I have reviewed and agree with the  assessment plan.   I have personally performed a face-to-face diagnostic evaluation of this patient this a.m.-[see hematology oncology progress note] and independently performed history and physical examination. Patient's condition has deteriorated, now with multiorgan failure.  Extremely poor prognosis, hospice appropriate.  Multiple discussion with family members. Patient transferred to ICU.  Will continue to follow.   Earlie Server, MD, PhD Hematology Oncology [facility] Pager- [phone]

## 2019-01-06 NOTE — Progress Notes (Signed)
   01/06/19 1700  Clinical Encounter Type  Visited With Family;Health care provider  Visit Type Follow-up  Referral From Nurse   Chaplain escorted the patient's family to the ICU waiting room where they awaited word on her status. Family escorted back to the ICU and updated on patient's status and prognosis from Dr. Mortimer Fries. Chaplain provided brief support to the family in the wake of difficult news. The family acknowledge no additional needs at this time. The family seems to be supportive of one another and the patient's son-in-law, who is also a Theme park manager, is present. If additional needs arise, please call or page the on-call chaplain for support.

## 2019-01-06 NOTE — Progress Notes (Signed)
Patient ID: Abigail Ayers, female   DOB: 1946-01-02, 73 y.o.   MRN: [id number]   Responded to [place].  When I got there her CPR was in progress.  Soon after we checked the pulse and patient did have a pulse.  Blood pressure was checked and 144/90.  ER physician intubated.  Put in transfer order to the ICU.  Spoke with critical care specialist Hinton Dyer.  Josh from palliative care speaking with family.  High risk for code again.  Dr Loletha Grayer

## 2019-01-06 NOTE — TOC Progression Note (Signed)
Transition of Care [facility]) - Progression Note    Patient Details  Name: JESSLYNN KRUCK MRN: [id number] Date of Birth: [birth year]  Transition of Care [facility] CM/SW Contact  Talli Kimmer, [name] Phone Number: [phone]  01/06/2019, 10:20 AM  Clinical Narrative: Per MD plan is for patient to D/C home with hospice. Clinical Education officer, museum (CSW) met with patient's daughter [place] at bedside. Patient was off the floor. CSW explained hospice services at home and provided hospice agency choice. [name] chose [facility]. Per MD patient will need a hospital bed and PRN oxygen. Per daughter patient also needs a bedside commode and a walker. Clinical Education officer, museum (CSW) contacted [facility] hospice liaison and made her aware of above. Per [place] she will work on referral. CSW will continue to follow and assist as needed.       Expected Discharge Plan: [place] Barriers to Discharge: Continued Medical Work up  Expected Discharge Plan and Services Expected Discharge Plan: [place] In-house Referral: Clinical Social Work Discharge Planning Services: CM Consult Post Acute Care Choice: [place] arrangements for the past 2 months: Single Family Home                                       Social Determinants of Health (SDOH) Interventions    Readmission Risk Interventions No flowsheet data found.

## 2019-01-06 NOTE — Discharge Summary (Addendum)
[place] [facility] [place] NAME: Abigail Ayers    MR#:  [id number]  DATE OF BIRTH:  Jul 08, 1945  DATE OF ADMISSION:  12/25/2018 ADMITTING PHYSICIAN: Saundra Shelling, MD  DATE OF DISCHARGE: 01/06/2019  PRIMARY CARE PHYSICIAN: Sharyne Peach, MD    ADMISSION DIAGNOSIS:  Dehydration [E86.0] Hyperammonemia ([place]) [E72.20] Altered mental status, unspecified altered mental status type [R41.82] Sepsis with acute renal failure, due to unspecified organism, unspecified acute renal failure type, unspecified whether septic shock present ([place]) [A41.9, R65.20, N17.9]  DISCHARGE DIAGNOSIS:  Active Problems:   Sepsis ([place])   Altered mental status   Dehydration   Hyperammonemia (HCC)   Elevated liver enzymes   Palliative care encounter   Nausea and vomiting   Noninfectious gastroenteritis   Other ascites   Hepatorenal failure (HCC)   AKI (acute kidney injury) ([place])   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 Hypertension    not currently taking meds    HOSPITAL COURSE:   73 year old female with hepatocellular carcinoma who presented to the emergency room due to altered mental status.  1.  Acute metabolic encephalopathy: Patient was evaluated by oncology, GI, neurology and ID.  Etiology of encephalopathy is metabolic in nature.  Encephalopathy has resolved. 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 All cultures have been negative.  Imaging has been unremarkable. Steroids have been changed to oral and are being tapered.  Antibiotics have been discontinued as per ID recommendations.  Sepsis ruled out White blood cell elevation is due to tumor burden.  Doppler was negative for DVT.  Abdominal ultrasound at the time of discharge showed very mild ascites.  She did not require paracentesis.  2.  Hepatocellular  carcinoma/ascites: She is an overall very poor prognosis.  After lengthy discussion with oncology and myself patient and family has decided on hospice.  Patient will be discharged with hospice at home.  3.  Acute kidney injury in the setting of dehydration and poor p.o. intake which is improved with IV fluids  4.  Hypernatremia: This is from poor p.o. intake   5.  Hypokalemia: This was repleted PRN  6.  Elevated LFTs in the setting of hepatocellular carcinoma   7.  Hypertension with tachycardia: Continue Metoprolol and Norvasc  She will be discharged home with hospice as per her wishes and family's wishes.  DISCHARGE CONDITIONS AND DIET:   Guarded condition diet as tolerated  CONSULTS OBTAINED:  Treatment Team:  Ottie Glazier, MD Catarina Hartshorn, MD  DRUG ALLERGIES:   Allergies  Allergen Reactions  . Lisinopril Other (See Comments)    Other reaction(s): Cough    DISCHARGE MEDICATIONS:   Allergies as of 01/06/2019      Reactions   Lisinopril Other (See Comments)   Other reaction(s): Cough      Medication List    TAKE these medications   dronabinol 5 MG capsule Commonly known as: MARINOL Take 1 capsule (5 mg total) by mouth 2 (two) times daily before a meal.   loperamide 2 MG capsule Commonly known as: IMODIUM Take 1 capsule (2 mg total) by mouth See admin instructions. With onset of loose stool, take 4mg  followed by 2mg  every 2 hours until 12 hours have passed without loose bowel movement. Maximum: 16 mg/day   multivitamin tablet Take 1 tablet by mouth.   ondansetron 8 MG tablet Commonly  known as: Zofran Take 1 tablet (8 mg total) by mouth 2 (two) times daily as needed (Nausea or vomiting).   potassium chloride 10 MEQ tablet Commonly known as: K-DUR Take 1 tablet (10 mEq total) by mouth daily.   predniSONE 10 MG tablet Commonly known as: DELTASONE Take 1 tablet (10 mg total) by mouth daily with breakfast. Take 20 mg on July 10 Take 10 mg on July  11 then stop   prochlorperazine 10 MG tablet Commonly known as: COMPAZINE Take 1 tablet (10 mg total) by mouth every 6 (six) hours as needed (Nausea or vomiting).   traMADol 50 MG tablet Commonly known as: ULTRAM Take 1 tablet (50 mg total) by mouth every 8 (eight) hours as needed.   vitamin B-12 100 MCG tablet Commonly known as: CYANOCOBALAMIN Take 50 mcg by mouth daily.   Vitamin D 1000 units capsule Take 1,000 Units by mouth.            Durable Medical Equipment  (From admission, onward)         Start     Ordered   01/06/19 1015  For home use only DME oxygen  Once    Question Answer Comment  Length of Need Lifetime   Mode or (Route) Nasal cannula   Liters per Minute 2   Frequency Continuous (stationary and portable oxygen unit needed)   Oxygen conserving device Yes   Oxygen delivery system Gas      01/06/19 1014            Today   CHIEF COMPLAINT:   Spoke with daughter this am and they would like to take patient home with hospice, daughter aware of very poor overall prognosis and progression of [place]  VITAL SIGNS:  Blood pressure (!) 80/58, pulse (!) 124, temperature 97.6 F (36.4 C), resp. rate (!) 22, height 5' 0.5" (1.537 m), weight 76.7 kg, SpO2 91 %.   REVIEW OF SYSTEMS: Patient in ultrasound    PHYSICAL EXAMINATION:   DATA REVIEW:   CBC Recent Labs  Lab 01/06/19 0327  WBC 57.2*  HGB 12.0  HCT 40.4  PLT 157    Chemistries  Recent Labs  Lab 01/06/19 0327  NA 152*  K 3.7  CL 116*  CO2 20*  GLUCOSE 154*  BUN 28*  CREATININE 1.74*  CALCIUM 8.9  MG 2.0  AST 622*  ALT 127*  ALKPHOS 194*  BILITOT 3.7*    Cardiac Enzymes No results for input(s): TROPONINI in the last 168 hours.  Microbiology Results  @MICRORSLT48 @  RADIOLOGY:  Dg Abd 1 View  Result Date: 01/04/2019 CLINICAL DATA:  Nausea, vomiting, abdominal pain EXAM: ABDOMEN - 1 VIEW COMPARISON:  CT abdomen 11/05/2018 FINDINGS: There is mild gaseous distension of  the small bowel. There is no bowel dilatation to suggest obstruction. There is no evidence of pneumoperitoneum, portal venous gas or pneumatosis. There are no pathologic calcifications along the expected course of the ureters. The osseous structures are unremarkable. IMPRESSION: No acute abdominal abnormality. Electronically Signed   By: Kathreen Devoid   On: 01/04/2019 11:53   US Abdomen Limited  Result Date: 01/06/2019 CLINICAL DATA:  Ascites. EXAM: LIMITED ABDOMEN ULTRASOUND FOR ASCITES TECHNIQUE: Limited ultrasound survey for ascites was performed in all four abdominal quadrants. COMPARISON:  Abdomen series 01/04/2019.  MRI 12/31/2018. FINDINGS: Minimal ascites noted. Paracentesis not performed. Repeat exam can be obtained as needed. IMPRESSION: Minimal ascites.  Paracentesis not performed. Electronically Signed   By: Marcello Moores  Register  On: 01/06/2019 10:09   US Venous Img Lower Bilateral  Result Date: 01/06/2019 CLINICAL DATA:  Lower extremity edema. EXAM: BILATERAL LOWER EXTREMITY VENOUS DOPPLER ULTRASOUND TECHNIQUE: Gray-scale sonography with graded compression, as well as color Doppler and duplex ultrasound were performed to evaluate the lower extremity deep venous systems from the level of the common femoral vein and including the common femoral, femoral, profunda femoral, popliteal and calf veins including the posterior tibial, peroneal and gastrocnemius veins when visible. The superficial great saphenous vein was also interrogated. Spectral Doppler was utilized to evaluate flow at rest and with distal augmentation maneuvers in the common femoral, femoral and popliteal veins. COMPARISON:  No recent. FINDINGS: RIGHT LOWER EXTREMITY Common Femoral Vein: No evidence of thrombus. Normal compressibility, respiratory phasicity and response to augmentation. Saphenofemoral Junction: No evidence of thrombus. Normal compressibility and flow on color Doppler imaging. Profunda Femoral Vein: No evidence of thrombus.  Normal compressibility and flow on color Doppler imaging. Femoral Vein: No evidence of thrombus. Normal compressibility, respiratory phasicity and response to augmentation. Popliteal Vein: No evidence of thrombus. Normal compressibility, respiratory phasicity and response to augmentation. Calf Veins: No evidence of thrombus. Normal compressibility and flow on color Doppler imaging. Superficial Great Saphenous Vein: No evidence of thrombus. Normal compressibility. Other Findings:  Due to edema. LEFT LOWER EXTREMITY Common Femoral Vein: No evidence of thrombus. Normal compressibility, respiratory phasicity and response to augmentation. Saphenofemoral Junction: No evidence of thrombus. Normal compressibility and flow on color Doppler imaging. Profunda Femoral Vein: No evidence of thrombus. Normal compressibility and flow on color Doppler imaging. Femoral Vein: No evidence of thrombus. Normal compressibility, respiratory phasicity and response to augmentation. Popliteal Vein: No evidence of thrombus. Normal compressibility, respiratory phasicity and response to augmentation. Calf Veins: No evidence of thrombus. Normal compressibility and flow on color Doppler imaging. Superficial Great Saphenous Vein: No evidence of thrombus. Normal compressibility. Other Findings:  Exam limited due to edema. IMPRESSION: No evidence of deep venous thrombosis in either lower extremity. Electronically Signed   By: Marcello Moores  Register   On: 01/06/2019 10:46      Allergies as of 01/06/2019      Reactions   Lisinopril Other (See Comments)   Other reaction(s): Cough      Medication List    TAKE these medications   dronabinol 5 MG capsule Commonly known as: MARINOL Take 1 capsule (5 mg total) by mouth 2 (two) times daily before a meal.   loperamide 2 MG capsule Commonly known as: IMODIUM Take 1 capsule (2 mg total) by mouth See admin instructions. With onset of loose stool, take 4mg  followed by 2mg  every 2 hours until 12 hours  have passed without loose bowel movement. Maximum: 16 mg/day   multivitamin tablet Take 1 tablet by mouth.   ondansetron 8 MG tablet Commonly known as: Zofran Take 1 tablet (8 mg total) by mouth 2 (two) times daily as needed (Nausea or vomiting).   potassium chloride 10 MEQ tablet Commonly known as: K-DUR Take 1 tablet (10 mEq total) by mouth daily.   predniSONE 10 MG tablet Commonly known as: DELTASONE Take 1 tablet (10 mg total) by mouth daily with breakfast. Take 20 mg on July 10 Take 10 mg on July 11 then stop   prochlorperazine 10 MG tablet Commonly known as: COMPAZINE Take 1 tablet (10 mg total) by mouth every 6 (six) hours as needed (Nausea or vomiting).   traMADol 50 MG tablet Commonly known as: ULTRAM Take 1 tablet (50 mg total) by mouth  every 8 (eight) hours as needed.   vitamin B-12 100 MCG tablet Commonly known as: CYANOCOBALAMIN Take 50 mcg by mouth daily.   Vitamin D 1000 units capsule Take 1,000 Units by mouth.            Durable Medical Equipment  (From admission, onward)         Start     Ordered   01/06/19 1015  For home use only DME oxygen  Once    Question Answer Comment  Length of Need Lifetime   Mode or (Route) Nasal cannula   Liters per Minute 2   Frequency Continuous (stationary and portable oxygen unit needed)   Oxygen conserving device Yes   Oxygen delivery system Gas      01/06/19 1014             Management plans discussed with the patient and family and they are in agreement. Stable for discharge hospice at home  Patient should follow up with hospice  CODE STATUS:     Code Status Orders  (From admission, onward)         Start     Ordered   11/29/2018 1400  Full code  Continuous     12/10/2018 1359        Code Status History    This patient has a current code status but no historical code status.   Advance Care Planning Activity    Advance Directive Documentation     Most Recent Value  Type of Advance  Directive  Healthcare Power of Attorney, Living will  Pre-existing out of facility DNR order (yellow form or pink MOST form)  -  "MOST" Form in Place?  -      TOTAL TIME TAKING CARE OF THIS PATIENT: 39 minutes.    Note: This dictation was prepared with Dragon dictation along with smaller phrase technology. Any transcriptional errors that result from this process are unintentional.  Bettey Costa M.D on 01/06/2019 at 11:07 AM  Between 7am to 6pm - Pager - [phone] After 6pm go to [email or web address] - password [email or web address] [place] [facility]  Office  [phone]  CC: Primary care physician; Sharyne Peach, MD

## 2019-01-06 NOTE — Progress Notes (Signed)
ETT PULLED BACK TO  20 CM MARK WITHUOT ANY INCIDENT SECURED WITH COMMERCIAL TUBE HOLDER TOLERATED WELL.

## 2019-01-06 NOTE — Significant Event (Signed)
Rapid Response Event Note  Overview: Time Called: 0164 Arrival Time: 1601 Event Type: Cardiac  Initial Focused Assessment: Code blue called on patient.  No pulse- chest compression started- 2 epi and 2 sodium bicarb was pushed- ED MD intubated patient at bedside.    Interventions: Patient transferred to ICU- started on vasopressors. Plan of Care (if not transferred):  Event Summary:   at      at          [facility]

## 2019-01-06 NOTE — Progress Notes (Signed)
Pt's troponin has increased from 700's to 1800's.  Discussed with Dr. Mortimer Fries as to whether to start heparin drip.  But given pt's liver failure and liver cancer, Dr. Mortimer Fries feels pt will likely hemorrhage if we heparinize her.  Therefore will not place on heparin drip.    [name], AGACNP-BC [facility] Pager: [phone] Cell: [phone]

## 2019-01-06 NOTE — Progress Notes (Signed)
Hematology/Oncology Progress Note [facility] Telephone:([phone] [phone]  Patient Care Team: Sharyne Peach, MD as PCP - General (Family Medicine) Bary Castilla, Forest Gleason, MD (General Surgery) Sharyne Peach, MD (Family Medicine) Clent Jacks, RN as Registered Nurse   Name of the patient: Abigail Ayers  [id number]  [birth year]  Date of visit: 01/06/19   INTERVAL HISTORY-  No acute events overnight.  Yesterday blood pressure was low.  Blood pressure improved after IV fluid. Patient is lying in the bed with no acute distress. Mental status at baseline. [name] was at bedside.  She denies any abdominal pain today. . Review of systems- Review of Systems  Constitutional: Positive for fatigue. Negative for chills and fever.  Eyes: Positive for icterus.  Respiratory: Negative for shortness of breath.   Gastrointestinal: Positive for abdominal distention and nausea. Negative for abdominal pain.  Skin: Negative for rash.  Neurological: Negative for headaches.  Hematological: Negative for adenopathy.  Psychiatric/Behavioral: Negative for confusion.    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   Claudication of calf muscles ([place]) 12/01/2017   Osteoarthritis of knee 06/17/2017   Nipple discharge 04/29/2013   Essential hypertension 09/17/2012     Past Medical History:  Diagnosis Date    Hypertension    not currently taking meds     Past Surgical History:  Procedure Laterality Date   BREAST BIOPSY Left    neg   CHOLECYSTECTOMY  1992    Social History   Socioeconomic History   Marital status: Widowed    Spouse name: Not on file   Number of children: Not on file   Years of education: Not on file   Highest education level: Not on file  Occupational History   Not on file  Social Needs   Financial resource strain: Not on file   Food insecurity    Worry: Not on file    Inability: Not on file   Transportation needs    Medical: Not on file    Non-medical: Not on file  Tobacco Use   Smoking status: Never Smoker   Smokeless tobacco: Never Used  Substance and Sexual Activity   Alcohol use: No   Drug use: No   Sexual activity: Not on file  Lifestyle   Physical activity    Days per week: Not on file    Minutes per session: Not on file   Stress: Not on file  Relationships   Social connections    Talks on phone: Not on file    Gets together: Not on file    Attends religious service: Not on file    Active member of club or organization: Not on file    Attends meetings of clubs or organizations: Not on file    Relationship status: Not on file   Intimate partner violence    Fear of current or ex partner: Not on file  Emotionally abused: Not on file    Physically abused: Not on file    Forced sexual activity: Not on file  Other Topics Concern   Not on file  Social History Narrative   Not on file     Family History  Problem Relation Age of Onset   Alzheimer's disease Mother    Stroke Father    Breast cancer Daughter 6       [place], genetic test negative     Current Facility-Administered Medications:    acetaminophen (TYLENOL) tablet 650 mg, 650 mg, Oral, Q6H PRN, Mody, Sital, MD   albumin human 25 % solution 25 g, 25 g, Intravenous, See admin instructions, Earlie Server, MD   chlorhexidine (PERIDEX) 0.12 % solution 15 mL,  15 mL, Mouth Rinse, BID, Tukov-Yual, Magdalene S, NP, 15 mL at 01/05/19 2157   dextrose 5 % in lactated ringers infusion, , Intravenous, Continuous, Mody, Sital, MD, Last Rate: 100 mL/hr at 01/06/19 0243   heparin injection 5,000 Units, 5,000 Units, Subcutaneous, Q8H, Mody, Sital, MD   hydrALAZINE (APRESOLINE) injection 10 mg, 10 mg, Intravenous, Q6H PRN, Mody, Sital, MD   MEDLINE mouth rinse, 15 mL, Mouth Rinse, q12n4p, Tukov-Yual, Magdalene S, NP, 15 mL at 01/03/19 1523   metoprolol tartrate (LOPRESSOR) tablet 50 mg, 50 mg, Oral, BID, Mody, Sital, MD, 50 mg at 01/05/19 0857   ondansetron (ZOFRAN) tablet 4 mg, 4 mg, Oral, Q6H PRN **OR** ondansetron (ZOFRAN) injection 4 mg, 4 mg, Intravenous, Q6H PRN, Pyreddy, Pavan, MD, 4 mg at 01/06/19 0031   pantoprazole (PROTONIX) injection 40 mg, 40 mg, Intravenous, Q12H, Blakeney, Dreama Saa, NP, 40 mg at 01/05/19 2157   [START ON January 28, 2019] predniSONE (DELTASONE) tablet 30 mg, 30 mg, Oral, Q breakfast, Mody, Sital, MD   simethicone (MYLICON) chewable tablet 80 mg, 80 mg, Oral, Q6H PRN, Awilda Bill, NP, 80 mg at 01/04/19 1550   sodium chloride flush (NS) 0.9 % injection 10-40 mL, 10-40 mL, Intracatheter, Q12H, Sudini, Srikar, MD, 10 mL at 01/05/19 2158   sodium chloride flush (NS) 0.9 % injection 10-40 mL, 10-40 mL, Intracatheter, PRN, Sudini, Srikar, MD   traMADol (ULTRAM) tablet 50 mg, 50 mg, Oral, Q6H PRN, Bettey Costa, MD, 50 mg at 01/04/19 1527   Physical exam:  Vitals:   01/05/19 1759 01/05/19 1923 01/05/19 1927 01/05/19 2157  BP: 116/72 99/70  105/65  Pulse:  (!) 115 (!) 116 (!) 117  Resp:  (!) 24  20  Temp:  97.6 F (36.4 C)    TempSrc:      SpO2:  (!) 89% 91% 90%  Weight:      Height:       Physical Exam  Constitutional: No distress.  Frail appearance female lying in the bed  HENT:  Head: Normocephalic and atraumatic.  Mouth/Throat: No oropharyngeal exudate.  Eyes: Pupils are equal, round, and reactive to light. EOM are  normal. No scleral icterus.  Neck: Normal range of motion. Neck supple.  Cardiovascular: Normal rate and regular rhythm.  No murmur heard. tachycardia  Pulmonary/Chest: Effort normal.  Shallow breathing, via nasal cannula oxygen.  Abdominal: Soft. Bowel sounds are normal. She exhibits distension. There is no abdominal tenderness.  Abdomen is more distended  Musculoskeletal: Normal range of motion.        General: No edema.  Neurological: She is alert.  Close to baseline mental status.  Answer simple questions. She moves all 4 extremities. Recognize me as treating physician.  Skin: Skin is warm and  dry. She is not diaphoretic. No erythema.      CMP Latest Ref Rng & Units 01/06/2019  Glucose 70 - 99 mg/dL 154(H)  BUN 8 - 23 mg/dL 28(H)  Creatinine 0.44 - 1.00 mg/dL 1.74(H)  Sodium 135 - 145 mmol/L 152(H)  Potassium 3.5 - 5.1 mmol/L 3.7  Chloride 98 - 111 mmol/L 116(H)  CO2 22 - 32 mmol/L 20(L)  Calcium 8.9 - 10.3 mg/dL 8.9  Total Protein 6.5 - 8.1 g/dL 5.6(L)  Total Bilirubin 0.3 - 1.2 mg/dL 3.7(H)  Alkaline Phos 38 - 126 U/L 194(H)  AST 15 - 41 U/L 622(H)  ALT 0 - 44 U/L 127(H)   CBC Latest Ref Rng & Units 01/06/2019  WBC 4.0 - 10.5 K/uL 57.2(HH)  Hemoglobin 12.0 - 15.0 g/dL 12.0  Hematocrit 36.0 - 46.0 % 40.4  Platelets 150 - 400 K/uL 157   RADIOGRAPHIC STUDIES: I have personally reviewed the radiological images as listed and agreed with the findings in the report.   Dg Abd 1 View  Result Date: 01/04/2019 CLINICAL DATA:  Nausea, vomiting, abdominal pain EXAM: ABDOMEN - 1 VIEW COMPARISON:  CT abdomen 11/05/2018 FINDINGS: There is mild gaseous distension of the small bowel. There is no bowel dilatation to suggest obstruction. There is no evidence of pneumoperitoneum, portal venous gas or pneumatosis. There are no pathologic calcifications along the expected course of the ureters. The osseous structures are unremarkable. IMPRESSION: No acute abdominal abnormality.  Electronically Signed   By: Kathreen Devoid   On: 01/04/2019 11:53   Ct Head Wo Contrast  Result Date: 12/25/2018 CLINICAL DATA:  Liver carcinoma with altered mental status/confusion EXAM: CT HEAD WITHOUT CONTRAST TECHNIQUE: Contiguous axial images were obtained from the base of the skull through the vertex without intravenous contrast. COMPARISON:  None. FINDINGS: Brain: Ventricles and sulci are within normal limits for age. There is no demonstrable intracranial mass, hemorrhage, extra-axial fluid collection, or midline shift. There is slight small vessel disease in the centra semiovale bilaterally. No acute infarct evident. Vascular: No hyperdense vessel. There are foci of calcification in each carotid siphon region. Skull: The bony calvarium appears intact. No neoplastic focus involving bony structures. Sinuses/Orbits: Visualized paranasal sinuses are clear. Visualized orbits appear symmetric bilaterally. Other: Mastoid air cells are clear. IMPRESSION: Mild periventricular small vessel disease. No acute infarct. No mass or hemorrhage. There are foci of arterial vascular calcification. Electronically Signed   By: Lowella Grip III M.D.   On: 12/02/2018 09:46   Ct Chest Wo Contrast  Result Date: 12/15/2018 CLINICAL DATA:  Hepatocellular carcinoma staging. Chest pain. Portal vein thrombosis. EXAM: CT CHEST WITHOUT CONTRAST TECHNIQUE: Multidetector CT imaging of the chest was performed following the standard protocol without IV contrast. COMPARISON:  Abdominal CT 11/05/2018.  No prior chest imaging. FINDINGS: Cardiovascular: Aortic atherosclerosis. Tortuous thoracic aorta. Borderline cardiomegaly. Multivessel coronary artery atherosclerosis. Mediastinum/Nodes: No supraclavicular adenopathy. No mediastinal or definite hilar adenopathy, given limitations of unenhanced CT. Lungs/Pleura: Trace right pleural fluid. No suspicious pulmonary nodule or mass. Areas of bilateral major fissure thickening versus small  subpleural lymph nodes. A right apical subpleural 3 mm nodule on image 29/3 is also most likely a subpleural lymph node. Upper Abdomen: Cirrhosis with heterogeneity throughout the liver, better evaluated on contrast-enhanced studies. New small volume ascites since 11/05/2018. Musculoskeletal: Thoracic spondylosis. IMPRESSION: 1.  No acute process or evidence of metastatic disease in the chest. 2. Coronary artery atherosclerosis. Aortic Atherosclerosis (ICD10-I70.0). 3. Trace right pleural fluid. 4. New small volume ascites. Electronically  Signed   By: Abigail Miyamoto M.D.   On: 12/15/2018 08:51   Mr Brain Wo Contrast  Result Date: 12/31/2018 CLINICAL DATA:  73 year old female with altered mental status, suspected sepsis. Liver tumor. EXAM: MRI HEAD WITHOUT CONTRAST TECHNIQUE: Multiplanar, multiecho pulse sequences of the brain and surrounding structures were obtained without intravenous contrast. COMPARISON:  Head CT 12/07/2018 and earlier. FINDINGS: Study is intermittently degraded by motion artifact despite repeated imaging attempts. Brain: No restricted diffusion to suggest acute infarction. No midline shift, mass effect, evidence of mass lesion, ventriculomegaly, extra-axial collection or acute intracranial hemorrhage. Cervicomedullary junction and pituitary are within normal limits. Scattered bilateral cerebral white matter T2 and FLAIR hyperintensity, mild to moderate for age. No cortical encephalomalacia or chronic cerebral blood products identified. No intrinsic increased T1 signal in the deep gray matter nuclei. The basal ganglia, thalami, brainstem, and cerebellum appear normal for age. Vascular: Major intracranial vascular flow voids are preserved. Skull and upper cervical spine: Negative visible cervical spine. Visible bone marrow signal appears within normal limits. Sinuses/Orbits: Negative orbits. Paranasal sinuses and mastoids are stable and well pneumatized. Other: Scalp and face soft tissues appear  negative. IMPRESSION: 1.  No acute intracranial abnormality. 2. Mild to moderate for age cerebral white matter signal changes, most commonly due to chronic small vessel disease. Electronically Signed   By: Genevie Ann M.D.   On: 12/31/2018 16:09   Mr 3d Recon At Scanner  Result Date: 12/31/2018 CLINICAL DATA:  Inpatient. 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 Recent initiation of chemotherapy. Patient admitted with abnormal liver function tests, sepsis, altered mental status. EXAM: MRI ABDOMEN WITHOUT AND WITH CONTRAST (INCLUDING MRCP) TECHNIQUE: Multiplanar multisequence MR imaging of the abdomen was performed both before and after the administration of intravenous contrast. Heavily T2-weighted images of the biliary and pancreatic ducts were obtained, and three-dimensional MRCP images were rendered by post processing. CONTRAST:  7 cc Gadavist IV. COMPARISON:  11/11/2018 MRI abdomen. FINDINGS: Lower chest: Small dependent bilateral pleural effusions, right greater than left, new from prior MRI. Passive atelectasis at the dependent lung bases bilaterally. Hepatobiliary: There are multiple heterogeneously enhancing poorly defined masses scattered throughout the right greater than left liver lobes, most of which are new since 11/11/2018 MRI. For example, right liver dome 2.5 x 2.0 cm mass (series 19/image 18), segment 7 right liver lobe 2.7 x 2.2 cm mass (series 19/image 24), peripheral right liver lobe 3.6 x 2.0 cm mass (series 19/image 28) and segment 2 left liver lobe 1.7 x 1.1 cm mass (series 19/image 29) are all new. Segment 8 right liver lobe 2.4 x 2.1 cm mass (series 19/image 22), previously 2.4 x 2.4 cm, not appreciably changed. 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 Cholecystectomy. No biliary ductal  dilatation. Common bile duct diameter 4 mm. No evidence of choledocholithiasis. Pancreas: Mild diffuse peripancreatic edema. No pancreatic mass or duct dilation. Spleen: Normal size. No mass. Adrenals/Urinary Tract: Normal adrenals. No hydronephrosis. Normal kidneys with no renal mass. Stomach/Bowel: Normal non-distended stomach. Normal caliber visualized bowel loops. Generalized wall thickening in visualized small and large bowel. Vascular/Lymphatic: Nonaneurysmal abdominal aorta. Patent hepatic, splenic and renal veins. Patent SMV. No pathologically enlarged lymph nodes in the abdomen. Other: Small volume perihepatic ascites.  No focal fluid collection. Musculoskeletal: No aggressive appearing focal osseous lesions. IMPRESSION: 1. Several heterogeneously enhancing poorly defined liver masses  scattered throughout the right greater than left liver lobes, most of which are new since 11/11/2018 MRI. Findings are compatible with progression of multifocal hepatocellular carcinoma. Previously visualized solitary segment 8 right liver mass is stable. 2. 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 3. Small volume perihepatic ascites. 4. Generalized bowel wall thickening and mild peripancreatic edema, nonspecific, potentially due to a portal hypertension and/or hypoproteinemia. 5. Small dependent bilateral pleural effusions, right greater than left, new. Electronically Signed   By: Ilona Sorrel M.D.   On: 12/31/2018 19:46   Dg Chest Port 1 View  Result Date: 12/20/2018 CLINICAL DATA:  73 year old female with history of confusion and disorientation after initial doses of chemotherapy for liver cancer. EXAM: PORTABLE CHEST 1 VIEW COMPARISON:  No priors. FINDINGS: Lung volumes are low. Linear opacities in lung bases bilaterally favored to reflect areas of subsegmental atelectasis or scarring. No consolidative airspace disease. No pleural effusions. No  evidence of pulmonary edema. Heart size is normal. The patient is rotated to the left on today's exam, resulting in distortion of the mediastinal contours and reduced diagnostic sensitivity and specificity for mediastinal pathology. Aortic atherosclerosis. IMPRESSION: 1. Low lung volumes with bibasilar areas of atelectasis and/or scarring (left greater than right). 2. Aortic atherosclerosis. Electronically Signed   By: Vinnie Langton M.D.   On: 12/09/2018 09:13   Mr Abdomen Mrcp Moise Boring Contast  Result Date: 12/31/2018 CLINICAL DATA:  Inpatient. 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 Recent initiation of chemotherapy. Patient admitted with abnormal liver function tests, sepsis, altered mental status. EXAM: MRI ABDOMEN WITHOUT AND WITH CONTRAST (INCLUDING MRCP) TECHNIQUE: Multiplanar multisequence MR imaging of the abdomen was performed both before and after the administration of intravenous contrast. Heavily T2-weighted images of the biliary and pancreatic ducts were obtained, and three-dimensional MRCP images were rendered by post processing. CONTRAST:  7 cc Gadavist IV. COMPARISON:  11/11/2018 MRI abdomen. FINDINGS: Lower chest: Small dependent bilateral pleural effusions, right greater than left, new from prior MRI. Passive atelectasis at the dependent lung bases bilaterally. Hepatobiliary: There are multiple heterogeneously enhancing poorly defined masses scattered throughout the right greater than left liver lobes, most of which are new since 11/11/2018 MRI. For example, right liver dome 2.5 x 2.0 cm mass (series 19/image 18), segment 7 right liver lobe 2.7 x 2.2 cm mass (series 19/image 24), peripheral right liver lobe 3.6 x 2.0 cm mass (series 19/image 28) and segment 2 left liver lobe 1.7 x 1.1 cm mass (series 19/image 29) are all new. Segment 8 right liver lobe 2.4 x 2.1 cm mass (series 19/image 22), previously 2.4 x 2.4 cm, not appreciably  changed. 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 Cholecystectomy. No biliary ductal dilatation. Common bile duct diameter 4 mm. No evidence of choledocholithiasis. Pancreas: Mild diffuse peripancreatic edema. No pancreatic mass or duct dilation. Spleen: Normal size. No mass. Adrenals/Urinary Tract: Normal adrenals. No hydronephrosis. Normal kidneys with no renal mass. Stomach/Bowel: Normal non-distended stomach. Normal caliber visualized bowel loops. Generalized wall thickening in visualized small and large bowel. Vascular/Lymphatic: Nonaneurysmal abdominal aorta. Patent hepatic, splenic and renal veins. Patent SMV. No pathologically enlarged lymph nodes in the abdomen. Other: Small volume perihepatic ascites.  No focal fluid collection. Musculoskeletal: No aggressive appearing focal osseous lesions. IMPRESSION: 1. Several heterogeneously enhancing poorly defined  liver masses scattered throughout the right greater than left liver lobes, most of which are new since 11/11/2018 MRI. Findings are compatible with progression of multifocal hepatocellular carcinoma. Previously visualized solitary segment 8 right liver mass is stable. 2. 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 3. Small volume perihepatic ascites. 4. Generalized bowel wall thickening and mild peripancreatic edema, nonspecific, potentially due to a portal hypertension and/or hypoproteinemia. 5. Small dependent bilateral pleural effusions, right greater than left, new. Electronically Signed   By: Ilona Sorrel M.D.   On: 12/31/2018 19:46   US Abdomen Limited Ruq  Result Date: 12/07/2018 CLINICAL DATA:  Hepatocellular carcinoma with tumor thrombus shown on prior imaging. Elevated liver enzymes. EXAM: ULTRASOUND ABDOMEN LIMITED RIGHT UPPER  QUADRANT COMPARISON:  11/11/2018 MRI FINDINGS: Gallbladder: Surgically absent Common bile duct: Diameter: 5 mm Liver: Heterogeneous right hepatic lobe likely due to underlying tumors and underlying tumor thrombus. There is notable thrombus in the vicinity of the right portal vein and portal vein confluence, with some reconstitution of portal venous flow or flow around the thrombus in the porta hepatis. There is some evidence of retrograde flow in parts of the portal vein before entering the liver for example on image 27 and image 28. There is also some heterogeneity in the left hepatic lobe, for example a 3.7 by 2.8 by 3.4 cm potential lesion posteriorly, and tumor involvement in the left hepatic lobe is not excluded. IMPRESSION: 1. Tumor primarily in the right hepatic lobe and possibly in the left hepatic lobe, with ill definition. 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 There is some retrograde flow in the segment of the portal vein leading towards this thrombus as shown on image 27. 2. No significant biliary dilatation is identified. Electronically Signed   By: Van Clines M.D.   On: 12/12/2018 17:04    Assessment and plan-  Patient is a 73 y.o. female with newly diagnosed [place], just started on first cycle of Tecentriq plus Avastin currently presents with altered mental status.     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  Discussed with daughter and patient.  Liver function worsened today.  AST ALT and bilirubin increased. Ascites likely due to impaired liver function/portal hypertension. Distention is worse and patient has shallow breathing.  Discussed with patient and daughter, recommend therapeutic ultrasound-guided paracentesis. If more than 4 L of fluid is removed, will proceed with 25 g of  albumin. We will also send cytology.   #Progressively worsening of leukocytosis.  Pathology smears favoring reactive, patible with his clinical picture. Likely leukemoid reaction secondary to steroid use and tumor progression. Rule out DVT.  #Goals of care discussion I discussed with patient and daughter at bedside.  Patient was not much engaged in discussion. Discussed with daughter [name] that patient's prognosis is extremely poor. Liver function unfortunately is getting worse.  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 We discussed about option of comfort care/hospice. Patient currently is full code, I strongly recommend patient and family to consider DNR/DNI. [name] will discuss with her siblings as well as patient's.  #Diarrhea, with possible immunotherapy induced colitis.  On steroids.  Continue slow taper. 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 # Hypernatremia, due to poor oral intake, and  third spacing.  Thank you for allowing me to participate in the care of this patient.  Above plan was discussed with hospitalist Dr. Benjie Karvonen as well as palliative care [facility]. We spent sufficient time to discuss many aspect of care, questions were answered to patient and daughter's satisfaction. Total face to face encounter time for this patient visit was 35 min. >50% of the time was  spent in counseling and coordination of care.    Earlie Server, MD, PhD Hematology Oncology [facility] Pager- [phone] 01/06/2019

## 2019-01-07 ENCOUNTER — Other Ambulatory Visit: Payer: Self-pay | Admitting: Oncology

## 2019-01-07 ENCOUNTER — Inpatient Hospital Stay: Payer: Medicare Other

## 2019-01-07 LAB — BLOOD GAS, ARTERIAL
Acid-Base Excess: 23.7 mmol/L — ABNORMAL HIGH (ref 0.0–2.0)
Acid-base deficit: 21.3 mmol/L — ABNORMAL HIGH (ref 0.0–2.0)
Acid-base deficit: 21.7 mmol/L — ABNORMAL HIGH (ref 0.0–2.0)
Bicarbonate: 7.7 mmol/L — ABNORMAL LOW (ref 20.0–28.0)
Bicarbonate: 8.9 mmol/L — ABNORMAL LOW (ref 20.0–28.0)
Bicarbonate: 9.1 mmol/L — ABNORMAL LOW (ref 20.0–28.0)
FIO2: 1
FIO2: 1
FIO2: 100
MECHVT: 500 mL
MECHVT: 500 mL
MECHVT: 500 mL
Mechanical Rate: 18
O2 Saturation: 75.8 %
O2 Saturation: 86.8 %
O2 Saturation: 87.7 %
PEEP: 5 cmH2O
PEEP: 5 cmH2O
PEEP: 5 cmH2O
Patient temperature: 37
Patient temperature: 37
Patient temperature: 37
RATE: 15 resp/min
RATE: 18 resp/min
pCO2 arterial: 35 mmHg (ref 32.0–48.0)
pCO2 arterial: 36 mmHg (ref 32.0–48.0)
pCO2 arterial: 36 mmHg (ref 32.0–48.0)
pH, Arterial: 6.95 — CL (ref 7.350–7.450)
pH, Arterial: 7 — CL (ref 7.350–7.450)
pH, Arterial: 7.01 — CL (ref 7.350–7.450)
pO2, Arterial: 63 mmHg — ABNORMAL LOW (ref 83.0–108.0)
pO2, Arterial: 79 mmHg — ABNORMAL LOW (ref 83.0–108.0)
pO2, Arterial: 86 mmHg (ref 83.0–108.0)

## 2019-01-07 LAB — GLUCOSE, CAPILLARY
Glucose-Capillary: 100 mg/dL — ABNORMAL HIGH (ref 70–99)
Glucose-Capillary: 112 mg/dL — ABNORMAL HIGH (ref 70–99)
Glucose-Capillary: 116 mg/dL — ABNORMAL HIGH (ref 70–99)
Glucose-Capillary: 123 mg/dL — ABNORMAL HIGH (ref 70–99)
Glucose-Capillary: 124 mg/dL — ABNORMAL HIGH (ref 70–99)
Glucose-Capillary: 126 mg/dL — ABNORMAL HIGH (ref 70–99)
Glucose-Capillary: 129 mg/dL — ABNORMAL HIGH (ref 70–99)
Glucose-Capillary: 89 mg/dL (ref 70–99)
Glucose-Capillary: 98 mg/dL (ref 70–99)

## 2019-01-07 LAB — COMPREHENSIVE METABOLIC PANEL
ALT: 405 U/L — ABNORMAL HIGH (ref 0–44)
ALT: 547 U/L — ABNORMAL HIGH (ref 0–44)
AST: 1427 U/L — ABNORMAL HIGH (ref 15–41)
AST: 2496 U/L — ABNORMAL HIGH (ref 15–41)
Albumin: 1.9 g/dL — ABNORMAL LOW (ref 3.5–5.0)
Albumin: 1.4 g/dL — ABNORMAL LOW (ref 3.5–5.0)
Alkaline Phosphatase: 232 U/L — ABNORMAL HIGH (ref 38–126)
Alkaline Phosphatase: 217 U/L — ABNORMAL HIGH (ref 38–126)
Anion gap: 30 — ABNORMAL HIGH (ref 5–15)
Anion gap: 34 — ABNORMAL HIGH (ref 5–15)
BUN: 34 mg/dL — ABNORMAL HIGH (ref 8–23)
BUN: 34 mg/dL — ABNORMAL HIGH (ref 8–23)
CO2: 11 mmol/L — ABNORMAL LOW (ref 22–32)
CO2: 11 mmol/L — ABNORMAL LOW (ref 22–32)
Calcium: 8.1 mg/dL — ABNORMAL LOW (ref 8.9–10.3)
Calcium: 7 mg/dL — ABNORMAL LOW (ref 8.9–10.3)
Chloride: 114 mmol/L — ABNORMAL HIGH (ref 98–111)
Chloride: 112 mmol/L — ABNORMAL HIGH (ref 98–111)
Creatinine, Ser: 2.65 mg/dL — ABNORMAL HIGH (ref 0.44–1.00)
Creatinine, Ser: 2.99 mg/dL — ABNORMAL HIGH (ref 0.44–1.00)
GFR calc Af Amer: 20 mL/min — ABNORMAL LOW (ref >60)
GFR calc Af Amer: 17 mL/min — ABNORMAL LOW (ref >60)
GFR calc non Af Amer: 17 mL/min — ABNORMAL LOW (ref >60)
GFR calc non Af Amer: 15 mL/min — ABNORMAL LOW (ref >60)
Glucose, Bld: 119 mg/dL — ABNORMAL HIGH (ref 70–99)
Glucose, Bld: 150 mg/dL — ABNORMAL HIGH (ref 70–99)
Potassium: 4.3 mmol/L (ref 3.5–5.1)
Potassium: 4.2 mmol/L (ref 3.5–5.1)
Sodium: 155 mmol/L — ABNORMAL HIGH (ref 135–145)
Sodium: 157 mmol/L — ABNORMAL HIGH (ref 135–145)
Total Bilirubin: 3.8 mg/dL — ABNORMAL HIGH (ref 0.3–1.2)
Total Bilirubin: 3.9 mg/dL — ABNORMAL HIGH (ref 0.3–1.2)
Total Protein: 4.6 g/dL — ABNORMAL LOW (ref 6.5–8.1)
Total Protein: 3.3 g/dL — ABNORMAL LOW (ref 6.5–8.1)

## 2019-01-07 LAB — BASIC METABOLIC PANEL
Anion gap: 31 — ABNORMAL HIGH (ref 5–15)
Anion gap: 37 — ABNORMAL HIGH (ref 5–15)
BUN: 33 mg/dL — ABNORMAL HIGH (ref 8–23)
BUN: 33 mg/dL — ABNORMAL HIGH (ref 8–23)
CO2: 11 mmol/L — ABNORMAL LOW (ref 22–32)
CO2: 9 mmol/L — ABNORMAL LOW (ref 22–32)
Calcium: 6.7 mg/dL — ABNORMAL LOW (ref 8.9–10.3)
Calcium: 7.5 mg/dL — ABNORMAL LOW (ref 8.9–10.3)
Chloride: 110 mmol/L (ref 98–111)
Chloride: 119 mmol/L — ABNORMAL HIGH (ref 98–111)
Creatinine, Ser: 2.99 mg/dL — ABNORMAL HIGH (ref 0.44–1.00)
Creatinine, Ser: 3.25 mg/dL — ABNORMAL HIGH (ref 0.44–1.00)
GFR calc Af Amer: 16 mL/min — ABNORMAL LOW (ref >60)
GFR calc Af Amer: 17 mL/min — ABNORMAL LOW (ref >60)
GFR calc non Af Amer: 14 mL/min — ABNORMAL LOW (ref >60)
GFR calc non Af Amer: 15 mL/min — ABNORMAL LOW (ref >60)
Glucose, Bld: 115 mg/dL — ABNORMAL HIGH (ref 70–99)
Glucose, Bld: 180 mg/dL — ABNORMAL HIGH (ref 70–99)
Potassium: 3.9 mmol/L (ref 3.5–5.1)
Potassium: 4.3 mmol/L (ref 3.5–5.1)
Sodium: 158 mmol/L — ABNORMAL HIGH (ref 135–145)
Sodium: 159 mmol/L — ABNORMAL HIGH (ref 135–145)

## 2019-01-07 LAB — VITAMIN B1: Vitamin B1 (Thiamine): 33.5 nmol/L — ABNORMAL LOW (ref 66.5–200.0)

## 2019-01-07 LAB — CBC
HCT: 33.5 % — ABNORMAL LOW (ref 36.0–46.0)
Hemoglobin: 9.1 g/dL — ABNORMAL LOW (ref 12.0–15.0)
MCH: 24.4 pg — ABNORMAL LOW (ref 26.0–34.0)
MCHC: 27.2 g/dL — ABNORMAL LOW (ref 30.0–36.0)
MCV: 89.8 fL (ref 80.0–100.0)
Platelets: 129 10*3/uL — ABNORMAL LOW (ref 150–400)
RBC: 3.73 MIL/uL — ABNORMAL LOW (ref 3.87–5.11)
RDW: 25.7 % — ABNORMAL HIGH (ref 11.5–15.5)
WBC: 24.2 10*3/uL — ABNORMAL HIGH (ref 4.0–10.5)
nRBC: 27.7 % — ABNORMAL HIGH (ref 0.0–0.2)

## 2019-01-07 LAB — AFP TUMOR MARKER: AFP, Serum, Tumor Marker: 24003 ng/mL — ABNORMAL HIGH (ref 0.0–8.3)

## 2019-01-07 LAB — CULTURE, BLOOD (ROUTINE X 2)
Culture: NO GROWTH
Culture: NO GROWTH
Special Requests: ADEQUATE
Special Requests: ADEQUATE

## 2019-01-07 LAB — LACTIC ACID, PLASMA
Lactic Acid, Venous: >11 mmol/L (ref 0.5–1.9)
Lactic Acid, Venous: >11 mmol/L (ref 0.5–1.9)

## 2019-01-07 LAB — MISC LABCORP TEST (SEND OUT)

## 2019-01-07 LAB — PROCALCITONIN: Procalcitonin: 2.23 ng/mL

## 2019-01-07 MED ORDER — PANTOPRAZOLE SODIUM 40 MG IV SOLR: 40.0000 mg | INTRAVENOUS | Status: DC

## 2019-01-07 MED ORDER — LORAZEPAM 2 MG/ML IJ SOLN: 1.0000 mg | INTRAMUSCULAR | Status: DC | PRN

## 2019-01-07 MED ORDER — BIOTENE DRY MOUTH MT LIQD: 15.0000 mL | OROMUCOSAL | Status: DC | PRN

## 2019-01-07 MED ORDER — POLYVINYL ALCOHOL 1.4 % OP SOLN
1.0000 [drp] | Freq: Four times a day (QID) | OPHTHALMIC | Status: DC | PRN
  Filled 2019-01-07: qty 15

## 2019-01-07 MED ORDER — SODIUM BICARBONATE 8.4 % IV SOLN
100.0000 meq | Freq: Once | INTRAVENOUS | Status: AC
  Administered 2019-01-07: 100 meq via INTRAVENOUS

## 2019-01-07 MED ORDER — GLYCOPYRROLATE 0.2 MG/ML IJ SOLN
0.2000 mg | INTRAMUSCULAR | Status: DC | PRN
  Filled 2019-01-07: qty 1

## 2019-01-07 MED ORDER — HALOPERIDOL LACTATE 5 MG/ML IJ SOLN: 0.5000 mg | INTRAMUSCULAR | Status: DC | PRN

## 2019-01-07 MED ORDER — LORAZEPAM 2 MG/ML IJ SOLN
INTRAMUSCULAR | Status: AC
  Filled 2019-01-07: qty 1

## 2019-01-07 MED ORDER — SODIUM BICARBONATE 8.4 % IV SOLN
INTRAVENOUS | Status: DC
  Administered 2019-01-07: 09:00:00 via INTRAVENOUS
  Filled 2019-01-07 (×3): qty 150

## 2019-01-07 MED ORDER — ONDANSETRON HCL 4 MG/2ML IJ SOLN: 4.0000 mg | Freq: Four times a day (QID) | INTRAMUSCULAR | Status: DC | PRN

## 2019-01-07 MED ORDER — FENTANYL BOLUS VIA INFUSION
50.0000 ug | INTRAVENOUS | Status: DC | PRN
  Filled 2019-01-07: qty 100

## 2019-01-10 LAB — BLOOD GAS, ARTERIAL
Acid-base deficit: 19.5 mmol/L — ABNORMAL HIGH (ref 0.0–2.0)
Bicarbonate: 9.1 mmol/L — ABNORMAL LOW (ref 20.0–28.0)
FIO2: 100
MECHVT: 500 mL
Mechanical Rate: 18
PEEP: 5 cmH2O
Patient temperature: 37
pCO2 arterial: 30 mmHg — ABNORMAL LOW (ref 32.0–48.0)
pH, Arterial: 7.09 — CL (ref 7.350–7.450)
pO2, Arterial: 87 mmHg (ref 83.0–108.0)

## 2019-01-11 ENCOUNTER — Telehealth: Payer: Self-pay

## 2019-01-11 LAB — CULTURE, BLOOD (ROUTINE X 2)
Culture: NO GROWTH
Culture: NO GROWTH
Special Requests: ADEQUATE

## 2019-01-11 NOTE — Telephone Encounter (Signed)
Death certificate has been placed in DS folder.

## 2019-01-13 ENCOUNTER — Other Ambulatory Visit: Payer: Medicare Other

## 2019-01-13 ENCOUNTER — Ambulatory Visit: Payer: Medicare Other

## 2019-01-13 ENCOUNTER — Ambulatory Visit: Payer: Medicare Other | Admitting: Oncology

## 2019-01-13 NOTE — Telephone Encounter (Signed)
Death certificate has been completed and placed up front for pick up.  Hassan Rowan with [employer] has been made aware of this information.  Nothing further is needed.

## 2019-01-25 LAB — HERPES SIMPLEX VIRUS(HSV) DNA BY PCR: HSV 1 DNA: NEGATIVE

## 2019-01-25 LAB — HSV DNA BY PCR (REFERENCE LAB): HSV 2 DNA: NEGATIVE

## 2019-01-29 NOTE — Progress Notes (Signed)
I was informed by ICU NP Marda Stalker, family does not want oncology to round on Ms.Mangal anymore since she is no longer a candidate for chemotherapy.  Will sign off. Updated Dr.Mody.

## 2019-01-29 NOTE — Death Summary Note (Signed)
DEATH SUMMARY   Patient Details  Name: Abigail Ayers MRN: [id number] DOB: [birth year]  Admission/Discharge Information   Admit Date:  01-02-2019  Date of Death:  01-13-2019  Time of Death:  12:24 pm   Length of Stay: 11  Referring Physician: Sharyne Peach, MD   Reason(s) for Hospitalization  Confusion   Diagnoses  Preliminary cause of death: Hepatocellular carcinoma ([place]) Secondary Diagnoses (including complications and co-morbidities):  Active Problems:   Sepsis ([place])   Altered mental status   Dehydration   Hyperammonemia ([facility])   Elevated liver enzymes   Palliative care encounter   Nausea and vomiting   Noninfectious gastroenteritis   Other ascites   Hepatorenal failure ([facility])   AKI (acute kidney injury) ([place])   Hepatorenal syndrome (H[facility])   Leukemoid reaction   Abnormal LFTs (liver function tests)   Ascites   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 year old female with a history of hepatitis C status post completion of Harvoni treatment in 2016, newly diagnosed hepatocellular carcinoma with extensive portal venous thrombosis (dx 12/16/2018) who presented to [facility] ER on 2019/01/02 with altered mental status. On 12/23/2018 she started initial hepatocellular carcinoma treatment of Tecentriq's and Avastin (both immune checkpoint inhibitors). She was also started on lenvatinib a  tyrosine kinase inhibitor. On 12/24/2018 she developed insomnia, vomiting, and diarrhea, which she notified [facility] cancer center and was prescribed zofran and imodium.  However, symptoms progressed despite treatment and pt developed confusion prompting ER visit.  Due to concern of possible sepsis although etiology unclear iv abx initiated.  She was subsequently admitted to the ICU for additional workup and treatment.  While in ICU she required precedex gtt due to worsening encephalopathy.  On 12/31/2018 MRCP revealed several heterogeneously enhancing poorly defined liver masses scattered throughout the right greater than left liver lobes, most of which were new since 11/11/2018 MRI results indicating progression of multifocal hepatocellular carcinoma. MR Brain 12/31/2018 revealed no acute intracranial abnormality.  Due to concern acute encephalopathy could be in setting of immune toxicity secondary to checkpoint inhibitors IV steroids initiated 12/31/2018.  On 01/02/2019 pt became alert with intermittent confusion and precedex gtt stopped.  On 01/03/2019 pt alert and oriented and transferred to the medsurg unit.  On 01/06/2019 code blue initiated pt required CPR, ACLS medications, and mechanical intubation.  Post cardiac arrest pt transferred to ICU for additional treatment, requiring multiple vasopressors for hypotension secondary to cardiogenic shock and possible septic shock in setting of possible intraabdominal infection and aspiration pneumonia. On 01-13-2019 despite aggressive treatment lab results revealed worsening multiorgan failure.  Therefore, after further discussion with pts family regarding poor prognosis pts code status changed to Do Not Resuscitate and pt transitioned to comfort measures only per family request on January 13, 2019.  Pt expired with family at bedside on 2019/01/13 at 12:24 pm.   Pertinent Labs and Studies  Significant Diagnostic Studies Dg Abd 1 View  Result Date: 01/06/2019 CLINICAL DATA:  OG  tube placement. EXAM: ABDOMEN - 1 VIEW COMPARISON:  None. FINDINGS: OG tube appears adequately positioned in the stomach. Overall bowel gas pattern is nonobstructive. Cholecystectomy clips in the RIGHT upper quadrant. IMPRESSION: OG tube appears adequately positioned in the stomach. Electronically Signed   By: Franki Cabot M.D.   On: 01/06/2019 17:23   Dg Abd 1 View  Result Date: 01/04/2019 CLINICAL DATA:  Nausea, vomiting, abdominal pain EXAM: ABDOMEN - 1 VIEW COMPARISON:  CT abdomen 11/05/2018 FINDINGS: There is mild gaseous distension of the small bowel. There is no bowel dilatation to suggest obstruction. There is no evidence of pneumoperitoneum, portal venous gas or pneumatosis. There are no pathologic calcifications along the expected course of the ureters. The osseous structures are unremarkable. IMPRESSION: No acute abdominal abnormality. Electronically Signed   By: Kathreen Devoid   On: 01/04/2019 11:53   Ct Head Wo Contrast  Result Date: 12/10/2018 CLINICAL DATA:  Liver carcinoma with altered mental status/confusion EXAM: CT HEAD WITHOUT CONTRAST TECHNIQUE: Contiguous axial images were obtained from the base of the skull through the vertex without intravenous contrast. COMPARISON:  None. FINDINGS: Brain: Ventricles and sulci are within normal limits for age. There is no demonstrable intracranial mass, hemorrhage, extra-axial fluid collection, or midline shift. There is slight small vessel disease in the centra semiovale bilaterally. No acute infarct evident. Vascular: No hyperdense vessel. There are foci of calcification in each carotid siphon region. Skull: The bony calvarium appears intact. No neoplastic focus involving bony structures. Sinuses/Orbits: Visualized paranasal sinuses are clear. Visualized orbits appear symmetric bilaterally. Other: Mastoid air cells are clear. IMPRESSION: Mild periventricular small vessel disease. No acute infarct. No mass or hemorrhage. There are foci of arterial  vascular calcification. Electronically Signed   By: Lowella Grip III M.D.   On: 12/07/2018 09:46   Ct Chest Wo Contrast  Result Date: 12/15/2018 CLINICAL DATA:  Hepatocellular carcinoma staging. Chest pain. Portal vein thrombosis. EXAM: CT CHEST WITHOUT CONTRAST TECHNIQUE: Multidetector CT imaging of the chest was performed following the standard protocol without IV contrast. COMPARISON:  Abdominal CT 11/05/2018.  No prior chest imaging. FINDINGS: Cardiovascular: Aortic atherosclerosis. Tortuous thoracic aorta. Borderline cardiomegaly. Multivessel coronary artery atherosclerosis. Mediastinum/Nodes: No supraclavicular adenopathy. No mediastinal or definite hilar adenopathy, given limitations of unenhanced CT. Lungs/Pleura: Trace right pleural fluid. No suspicious pulmonary nodule or mass. Areas of bilateral major fissure thickening versus small subpleural lymph nodes. A right apical subpleural 3 mm nodule on image 29/3 is also most likely a subpleural lymph node. Upper Abdomen: Cirrhosis with heterogeneity throughout the liver, better evaluated on contrast-enhanced studies. New small volume ascites since 11/05/2018. Musculoskeletal: Thoracic spondylosis. IMPRESSION: 1.  No acute process or evidence of metastatic disease in the chest. 2. Coronary artery atherosclerosis. Aortic Atherosclerosis (ICD10-I70.0). 3. Trace right pleural fluid. 4. New small volume ascites. Electronically Signed   By: Abigail Miyamoto M.D.   On: 12/15/2018 08:51   Mr Brain Wo Contrast  Result Date: 12/31/2018 CLINICAL DATA:  73 year old female with altered mental status, suspected sepsis. Liver tumor. EXAM: MRI HEAD WITHOUT CONTRAST TECHNIQUE: Multiplanar, multiecho pulse sequences of the brain and surrounding structures were obtained without intravenous contrast. COMPARISON:  Head CT 12/07/2018 and earlier. FINDINGS: Study is intermittently degraded by motion artifact despite repeated imaging attempts. Brain: No restricted diffusion  to suggest acute infarction. No midline shift, mass effect, evidence of mass lesion, ventriculomegaly, extra-axial collection or acute intracranial hemorrhage. Cervicomedullary junction and pituitary are within normal limits.  Scattered bilateral cerebral white matter T2 and FLAIR hyperintensity, mild to moderate for age. No cortical encephalomalacia or chronic cerebral blood products identified. No intrinsic increased T1 signal in the deep gray matter nuclei. The basal ganglia, thalami, brainstem, and cerebellum appear normal for age. Vascular: Major intracranial vascular flow voids are preserved. Skull and upper cervical spine: Negative visible cervical spine. Visible bone marrow signal appears within normal limits. Sinuses/Orbits: Negative orbits. Paranasal sinuses and mastoids are stable and well pneumatized. Other: Scalp and face soft tissues appear negative. IMPRESSION: 1.  No acute intracranial abnormality. 2. Mild to moderate for age cerebral white matter signal changes, most commonly due to chronic small vessel disease. Electronically Signed   By: Genevie Ann M.D.   On: 12/31/2018 16:09   Mr 3d Recon At Scanner  Result Date: 12/31/2018 CLINICAL DATA:  Inpatient. 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 Recent initiation of chemotherapy. Patient admitted with abnormal liver function tests, sepsis, altered mental status. EXAM: MRI ABDOMEN WITHOUT AND WITH CONTRAST (INCLUDING MRCP) TECHNIQUE: Multiplanar multisequence MR imaging of the abdomen was performed both before and after the administration of intravenous contrast. Heavily T2-weighted images of the biliary and pancreatic ducts were obtained, and three-dimensional MRCP images were rendered by post processing. CONTRAST:  7 cc Gadavist IV. COMPARISON:  11/11/2018 MRI abdomen. FINDINGS: Lower chest: Small dependent bilateral pleural effusions, right greater than left, new from prior MRI. Passive  atelectasis at the dependent lung bases bilaterally. Hepatobiliary: There are multiple heterogeneously enhancing poorly defined masses scattered throughout the right greater than left liver lobes, most of which are new since 11/11/2018 MRI. For example, right liver dome 2.5 x 2.0 cm mass (series 19/image 18), segment 7 right liver lobe 2.7 x 2.2 cm mass (series 19/image 24), peripheral right liver lobe 3.6 x 2.0 cm mass (series 19/image 28) and segment 2 left liver lobe 1.7 x 1.1 cm mass (series 19/image 29) are all new. Segment 8 right liver lobe 2.4 x 2.1 cm mass (series 19/image 22), previously 2.4 x 2.4 cm, not appreciably changed. 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 Cholecystectomy. No biliary ductal dilatation. Common bile duct diameter 4 mm. No evidence of choledocholithiasis. Pancreas: Mild diffuse peripancreatic edema. No pancreatic mass or duct dilation. Spleen: Normal size. No mass. Adrenals/Urinary Tract: Normal adrenals. No hydronephrosis. Normal kidneys with no renal mass. Stomach/Bowel: Normal non-distended stomach. Normal caliber visualized bowel loops. Generalized wall thickening in visualized small and large bowel. Vascular/Lymphatic: Nonaneurysmal abdominal aorta. Patent hepatic, splenic and renal veins. Patent SMV. No pathologically enlarged lymph nodes in the abdomen. Other: Small volume perihepatic ascites.  No focal fluid collection. Musculoskeletal: No aggressive appearing focal osseous lesions. IMPRESSION: 1. Several heterogeneously enhancing poorly defined liver masses scattered throughout the right greater than left liver lobes, most of which are new since 11/11/2018 MRI. Findings are compatible with progression of multifocal hepatocellular carcinoma. Previously visualized solitary segment 8 right liver mass is stable. 2. Near occlusive  tumor thrombus in the distal main portal vein and right and left portal veins, slightly decreased. 3. Small volume perihepatic ascites. 4. Generalized bowel wall thickening and mild peripancreatic edema, nonspecific, potentially due to a portal hypertension and/or hypoproteinemia. 5. Small dependent bilateral pleural effusions, right greater than left, new. Electronically Signed   By: Ilona Sorrel M.D.   On: 12/31/2018 19:46   US Abdomen Limited  Result Date: 01/06/2019 CLINICAL DATA:  Ascites. EXAM: LIMITED ABDOMEN ULTRASOUND FOR ASCITES TECHNIQUE: Limited ultrasound survey for ascites was performed in all four abdominal quadrants. COMPARISON:  Abdomen series 01/04/2019.  MRI 12/31/2018. FINDINGS: Minimal ascites noted. Paracentesis not performed. Repeat exam can be obtained as needed. IMPRESSION: Minimal ascites.  Paracentesis not performed. Electronically Signed   By: Marcello Moores  Register   On: 01/06/2019 10:09   US Venous Img Lower Bilateral  Result Date: 01/06/2019 CLINICAL DATA:  Lower extremity edema. EXAM: BILATERAL LOWER EXTREMITY VENOUS DOPPLER ULTRASOUND TECHNIQUE: Gray-scale sonography with graded compression, as well as color Doppler and duplex ultrasound were performed to evaluate the lower extremity deep venous systems from the level of the common femoral vein and including the common femoral, femoral, profunda femoral, popliteal and calf veins including the posterior tibial, peroneal and gastrocnemius veins when visible. The superficial great saphenous vein was also interrogated. Spectral Doppler was utilized to evaluate flow at rest and with distal augmentation maneuvers in the common femoral, femoral and popliteal veins. COMPARISON:  No recent. FINDINGS: RIGHT LOWER EXTREMITY Common Femoral Vein: No evidence of thrombus. Normal compressibility, respiratory phasicity and response to augmentation. Saphenofemoral Junction: No evidence of thrombus. Normal compressibility and flow on color Doppler  imaging. Profunda Femoral Vein: No evidence of thrombus. Normal compressibility and flow on color Doppler imaging. Femoral Vein: No evidence of thrombus. Normal compressibility, respiratory phasicity and response to augmentation. Popliteal Vein: No evidence of thrombus. Normal compressibility, respiratory phasicity and response to augmentation. Calf Veins: No evidence of thrombus. Normal compressibility and flow on color Doppler imaging. Superficial Great Saphenous Vein: No evidence of thrombus. Normal compressibility. Other Findings:  Due to edema. LEFT LOWER EXTREMITY Common Femoral Vein: No evidence of thrombus. Normal compressibility, respiratory phasicity and response to augmentation. Saphenofemoral Junction: No evidence of thrombus. Normal compressibility and flow on color Doppler imaging. Profunda Femoral Vein: No evidence of thrombus. Normal compressibility and flow on color Doppler imaging. Femoral Vein: No evidence of thrombus. Normal compressibility, respiratory phasicity and response to augmentation. Popliteal Vein: No evidence of thrombus. Normal compressibility, respiratory phasicity and response to augmentation. Calf Veins: No evidence of thrombus. Normal compressibility and flow on color Doppler imaging. Superficial Great Saphenous Vein: No evidence of thrombus. Normal compressibility. Other Findings:  Exam limited due to edema. IMPRESSION: No evidence of deep venous thrombosis in either lower extremity. Electronically Signed   By: Marcello Moores  Register   On: 01/06/2019 10:46   Dg Chest Port 1 View  Result Date: 01/18/19 CLINICAL DATA:  Acute respiratory failure. EXAM: PORTABLE CHEST 1 VIEW COMPARISON:  01/06/2019. FINDINGS: Endotracheal tube has been repositioned, its tip is now 2.6 cm above the carina. NG tube tip below left hemidiaphragm. Small IV catheter again noted over the left neck. Heart size normal. Severe bilateral pulmonary infiltrates/edema again noted. No pleural effusion or  pneumothorax. IMPRESSION: 1. Endotracheal tube has been repositioned, its tip is now 2.6 cm above the carina. NG tube below the left hemidiaphragm. Small IV catheter again noted over the left neck. 2. Bilateral pulmonary infiltrates/edema again noted. No interim change. Electronically Signed   By: Marcello Moores  Register   On: 01-18-19 07:44   Dg Chest Port 1 View  Result Date: 01/06/2019 CLINICAL DATA:  Encounter for central line placement EXAM: PORTABLE CHEST 1 VIEW COMPARISON:  01/06/2019 FINDINGS: Endotracheal tube is in place with tip 12 millimeters above the carina. The tip of the endotracheal tube is directed towards the RIGHT mainstem bronchus. Consider withdrawing endotracheal tube by 2  centimeters. Nasogastric tube is in place, tip in off the image beyond the gastroesophageal junction. A small bore catheter has been placed in the LEFT side of the neck, likely an EJ catheter. Despite the history, no central line is identified. Heart is enlarged. There are bilateral pleural effusions. Bibasilar opacities have increased since prior study. IMPRESSION: 1. Endotracheal tube directed towards the RIGHT mainstem bronchus. Consider withdrawing endotracheal tube by 2 centimeters. 2. Increased bibasilar opacities and pleural effusions. 3. Interval placement of LEFT EJ line. Central line is not imaged. Recommend correlation with history. 4. These results will be called to the ordering clinician or representative by the Radiologist Assistant, and communication documented in the PACS or zVision Dashboard. Electronically Signed   By: Nolon Nations M.D.   On: 01/06/2019 19:42   Dg Chest Port 1 View  Result Date: 01/06/2019 CLINICAL DATA:  Endotracheal tube placement EXAM: PORTABLE CHEST 1 VIEW COMPARISON:  Chest x-ray from earlier same day FINDINGS: Endotracheal tube projects slightly into the RIGHT mainstem bronchus. Heart size is within normal limits. Pacer pad overlies the LEFT heart/mediastinal border. New  bibasilar opacities, presumably asymmetric edema and/or atelectasis. No pneumothorax seen. IMPRESSION: 1. Endotracheal tube projects slightly into the RIGHT mainstem bronchus. Recommend retracting approximately 2 cm. 2. New bibasilar opacities, presumably asymmetric edema and/or atelectasis. These results will be called to the ordering clinician or representative by the Radiologist Assistant, and communication documented in the PACS or zVision Dashboard. Electronically Signed   By: Franki Cabot M.D.   On: 01/06/2019 17:23   Dg Chest Port 1 View  Result Date: 12/25/2018 CLINICAL DATA:  73 year old female with history of confusion and disorientation after initial doses of chemotherapy for liver cancer. EXAM: PORTABLE CHEST 1 VIEW COMPARISON:  No priors. FINDINGS: Lung volumes are low. Linear opacities in lung bases bilaterally favored to reflect areas of subsegmental atelectasis or scarring. No consolidative airspace disease. No pleural effusions. No evidence of pulmonary edema. Heart size is normal. The patient is rotated to the left on today's exam, resulting in distortion of the mediastinal contours and reduced diagnostic sensitivity and specificity for mediastinal pathology. Aortic atherosclerosis. IMPRESSION: 1. Low lung volumes with bibasilar areas of atelectasis and/or scarring (left greater than right). 2. Aortic atherosclerosis. Electronically Signed   By: Vinnie Langton M.D.   On: 12/05/2018 09:13   Mr Abdomen Mrcp Moise Boring Contast  Result Date: 12/31/2018 CLINICAL DATA:  Inpatient. 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 Recent initiation of chemotherapy. Patient admitted with abnormal liver function tests, sepsis, altered mental status. EXAM: MRI ABDOMEN WITHOUT AND WITH CONTRAST (INCLUDING MRCP) TECHNIQUE: Multiplanar multisequence MR imaging of the abdomen was performed both before and after the administration of intravenous contrast.  Heavily T2-weighted images of the biliary and pancreatic ducts were obtained, and three-dimensional MRCP images were rendered by post processing. CONTRAST:  7 cc Gadavist IV. COMPARISON:  11/11/2018 MRI abdomen. FINDINGS: Lower chest: Small dependent bilateral pleural effusions, right greater than left, new from prior MRI. Passive atelectasis at the dependent lung bases bilaterally. Hepatobiliary: There are multiple heterogeneously enhancing poorly defined masses scattered throughout the right greater than left liver lobes, most of which are new since 11/11/2018 MRI. For example, right liver dome 2.5 x 2.0 cm mass (series 19/image 18), segment 7 right liver lobe 2.7 x 2.2 cm mass (series 19/image 24), peripheral right liver lobe 3.6 x 2.0 cm mass (series 19/image 28) and segment 2 left liver lobe 1.7 x 1.1 cm mass (series  19/image 29) are all new. Segment 8 right liver lobe 2.4 x 2.1 cm mass (series 19/image 22), previously 2.4 x 2.4 cm, not appreciably changed. 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 Cholecystectomy. No biliary ductal dilatation. Common bile duct diameter 4 mm. No evidence of choledocholithiasis. Pancreas: Mild diffuse peripancreatic edema. No pancreatic mass or duct dilation. Spleen: Normal size. No mass. Adrenals/Urinary Tract: Normal adrenals. No hydronephrosis. Normal kidneys with no renal mass. Stomach/Bowel: Normal non-distended stomach. Normal caliber visualized bowel loops. Generalized wall thickening in visualized small and large bowel. Vascular/Lymphatic: Nonaneurysmal abdominal aorta. Patent hepatic, splenic and renal veins. Patent SMV. No pathologically enlarged lymph nodes in the abdomen. Other: Small volume perihepatic ascites.  No focal fluid collection. Musculoskeletal: No aggressive appearing focal osseous lesions. IMPRESSION: 1. Several  heterogeneously enhancing poorly defined liver masses scattered throughout the right greater than left liver lobes, most of which are new since 11/11/2018 MRI. Findings are compatible with progression of multifocal hepatocellular carcinoma. Previously visualized solitary segment 8 right liver mass is stable. 2. 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 3. Small volume perihepatic ascites. 4. Generalized bowel wall thickening and mild peripancreatic edema, nonspecific, potentially due to a portal hypertension and/or hypoproteinemia. 5. Small dependent bilateral pleural effusions, right greater than left, new. Electronically Signed   By: Ilona Sorrel M.D.   On: 12/31/2018 19:46   US Abdomen Limited Ruq  Result Date: 12/15/2018 CLINICAL DATA:  Hepatocellular carcinoma with tumor thrombus shown on prior imaging. Elevated liver enzymes. EXAM: ULTRASOUND ABDOMEN LIMITED RIGHT UPPER QUADRANT COMPARISON:  11/11/2018 MRI FINDINGS: Gallbladder: Surgically absent Common bile duct: Diameter: 5 mm Liver: Heterogeneous right hepatic lobe likely due to underlying tumors and underlying tumor thrombus. There is notable thrombus in the vicinity of the right portal vein and portal vein confluence, with some reconstitution of portal venous flow or flow around the thrombus in the porta hepatis. There is some evidence of retrograde flow in parts of the portal vein before entering the liver for example on image 27 and image 28. There is also some heterogeneity in the left hepatic lobe, for example a 3.7 by 2.8 by 3.4 cm potential lesion posteriorly, and tumor involvement in the left hepatic lobe is not excluded. IMPRESSION: 1. Tumor primarily in the right hepatic lobe and possibly in the left hepatic lobe, with ill definition. 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 There is some retrograde  flow in the segment of the portal vein leading towards this thrombus as shown on image 27. 2. No significant biliary dilatation is identified. Electronically Signed   By: Van Clines M.D.   On: 12/05/2018 17:04    Microbiology Recent Results (from the past 240 hour(s))  Gastrointestinal Panel by PCR , Stool     Status: None   Collection Time: 12/29/18  8:51 PM   Specimen: Stool  Result Value Ref Range Status   Campylobacter species NOT DETECTED NOT DETECTED Final   Plesimonas shigelloides NOT DETECTED NOT DETECTED Final   Salmonella species NOT DETECTED NOT DETECTED Final   Yersinia enterocolitica NOT DETECTED NOT DETECTED Final   Vibrio species NOT DETECTED NOT DETECTED Final   Vibrio cholerae NOT DETECTED NOT DETECTED Final   Enteroaggregative E coli (EAEC) NOT DETECTED NOT DETECTED Final  Enteropathogenic E coli (EPEC) NOT DETECTED NOT DETECTED Final   Enterotoxigenic E coli (ETEC) NOT DETECTED NOT DETECTED Final   Shiga like toxin producing E coli (STEC) NOT DETECTED NOT DETECTED Final   Shigella/Enteroinvasive E coli (EIEC) NOT DETECTED NOT DETECTED Final   Cryptosporidium NOT DETECTED NOT DETECTED Final   Cyclospora cayetanensis NOT DETECTED NOT DETECTED Final   Entamoeba histolytica NOT DETECTED NOT DETECTED Final   Giardia lamblia NOT DETECTED NOT DETECTED Final   Adenovirus F40/41 NOT DETECTED NOT DETECTED Final   Astrovirus NOT DETECTED NOT DETECTED Final   Norovirus GI/GII NOT DETECTED NOT DETECTED Final   Rotavirus A NOT DETECTED NOT DETECTED Final   Sapovirus (I, II, IV, and V) NOT DETECTED NOT DETECTED Final    Comment: Performed at [facility], [address] [place], [place] [address]  C difficile quick scan w PCR reflex     Status: None   Collection Time: 12/29/18  8:51 PM   Specimen: Stool  Result Value Ref Range Status   C Diff antigen NEGATIVE NEGATIVE Final   C Diff toxin NEGATIVE NEGATIVE Final   C Diff interpretation No C. difficile  detected.  Final    Comment: Performed at [facility], [address] [place], [place] [address]  Culture, blood (Routine X 2) w Reflex to ID Panel     Status: None   Collection Time: 01/02/19 10:59 AM   Specimen: BLOOD  Result Value Ref Range Status   Specimen Description BLOOD LEFT ARM  Final   Special Requests   Final    BOTTLES DRAWN AEROBIC AND ANAEROBIC Blood Culture adequate volume   Culture   Final    NO GROWTH 5 DAYS Performed at [facility], [address], [place], [place] [address]    Report Status 2019/01/29 FINAL  Final  Culture, blood (Routine X 2) w Reflex to ID Panel     Status: None   Collection Time: 01/02/19 11:27 AM   Specimen: BLOOD  Result Value Ref Range Status   Specimen Description BLOOD BLOOD LEFT FOREARM  Final   Special Requests   Final    BOTTLES DRAWN AEROBIC AND ANAEROBIC Blood Culture adequate volume   Culture   Final    NO GROWTH 5 DAYS Performed at [facility], [address] [place], [place] [address]    Report Status 01/29/2019 FINAL  Final  SARS Coronavirus 2 (CEPHEID - Performed in [place] hospital lab), Hosp Order     Status: None   Collection Time: 01/04/19  9:28 AM   Specimen: Nasopharyngeal Swab  Result Value Ref Range Status   SARS Coronavirus 2 NEGATIVE NEGATIVE Final    Comment: (NOTE) If result is NEGATIVE SARS-CoV-2 target nucleic acids are NOT DETECTED. The SARS-CoV-2 RNA is generally detectable in upper and lower  respiratory specimens during the acute phase of infection. The lowest  concentration of SARS-CoV-2 viral copies this assay can detect is 250  copies / mL. A negative result does not preclude SARS-CoV-2 infection  and should not be used as the sole basis for treatment or other  patient management decisions.  A negative result may occur with  improper specimen collection / handling, submission of specimen other  than nasopharyngeal swab, presence of viral mutation(s) within the   areas targeted by this assay, and inadequate number of viral copies  (<250 copies / mL). A negative result must be combined with clinical  observations, patient history, and epidemiological information. If  result is POSITIVE SARS-CoV-2 target nucleic acids are DETECTED. The SARS-CoV-2 RNA is generally detectable in upper and lower  respiratory specimens dur ing the acute phase of infection.  Positive  results are indicative of active infection with SARS-CoV-2.  Clinical  correlation with patient history and other diagnostic information is  necessary to determine patient infection status.  Positive results do  not rule out bacterial infection or co-infection with other viruses. If result is PRESUMPTIVE POSTIVE SARS-CoV-2 nucleic acids MAY BE PRESENT.   A presumptive positive result was obtained on the submitted specimen  and confirmed on repeat testing.  While 2019 novel coronavirus  (SARS-CoV-2) nucleic acids may be present in the submitted sample  additional confirmatory testing may be necessary for epidemiological  and / or clinical management purposes  to differentiate between  SARS-CoV-2 and other Sarbecovirus currently known to infect humans.  If clinically indicated additional testing with an alternate test  methodology [id number]) is advised. The SARS-CoV-2 RNA is generally  detectable in upper and lower respiratory sp ecimens during the acute  phase of infection. The expected result is Negative. Fact Sheet for Patients:  StrictlyIdeas.no Fact Sheet for Healthcare Providers: BankingDealers.co.za This test is not yet approved or cleared by the [place] FDA and has been authorized for detection and/or diagnosis of SARS-CoV-2 by FDA under an Emergency Use Authorization (EUA).  This EUA will remain in effect (meaning this test can be used) for the duration of the COVID-19 declaration under Section 564(b)(1) of the Act, 21  U.S.C. section 360bbb-3(b)(1), unless the authorization is terminated or revoked sooner. Performed at [facility], [address] [place], [place] [address]   CULTURE, BLOOD (ROUTINE X 2) w Reflex to ID Panel     Status: None (Preliminary result)   Collection Time: 01/06/19  9:47 PM   Specimen: BLOOD  Result Value Ref Range Status   Specimen Description BLOOD BLOOD LEFT ARM  Final   Special Requests   Final    BOTTLES DRAWN AEROBIC AND ANAEROBIC Blood Culture adequate volume   Culture   Final    NO GROWTH < 12 HOURS Performed at [facility], [address] [place], [place] [address]    Report Status PENDING  Incomplete  CULTURE, BLOOD (ROUTINE X 2) w Reflex to ID Panel     Status: None (Preliminary result)   Collection Time: 01/06/19 10:00 PM   Specimen: BLOOD  Result Value Ref Range Status   Specimen Description BLOOD BLOOD LEFT HAND  Final   Special Requests   Final    BOTTLES DRAWN AEROBIC ONLY Blood Culture results may not be optimal due to an inadequate volume of blood received in culture bottles   Culture   Final    NO GROWTH < 12 HOURS Performed at [facility], [address] [place], [place] [address]    Report Status PENDING  Incomplete    Lab Basic Metabolic Panel: Recent Labs  Lab 01/02/19 0523 01/03/19 0433  01/06/19 0327 01/06/19 1742 01/06/19 2355 01-19-2019 0414 2019/01/19 0757  NA 150* 148*   < > 152* 155* 159* 157* 158*  K 3.5 3.4*   < > 3.7 4.3 3.9 4.2 4.3  CL 119* 113*   < > 116* 114* 119* 112* 110  CO2 25 24   < > 20* 11* 9* 11* 11*  GLUCOSE 178* 149*   < > 154* 119* 115* 150* 180*  BUN 25* 21   < > 28* 34* 33* 34*  33*  CREATININE 0.82 0.76   < > 1.74* 2.65* 2.99* 2.99* 3.25*  CALCIUM 8.4* 8.7*   < > 8.9 8.1* 7.5* 7.0* 6.7*  MG 1.9 2.0  --  2.0 2.1  --   --   --   PHOS 2.0* 2.7  --  2.6  --   --   --   --    < > = values in this interval not displayed.   Liver Function Tests: Recent Labs  Lab 01/01/19 0448  01/02/19 0523 01/03/19 0433 01/06/19 0327 01/06/19 1742 01/17/19 0414  AST 68* 62*  --  622* 1,427* 2,496*  ALT 45* 41  --  127* 405* 547*  ALKPHOS 310* 304*  --  194* 232* 217*  BILITOT 1.8* 1.8*  --  3.7* 3.8* 3.9*  PROT 6.0* 5.7*  --  5.6* 4.6* 3.3*  ALBUMIN 2.2* 2.0* 2.3* 2.4* 1.9* 1.4*   No results for input(s): LIPASE, AMYLASE in the last 168 hours. No results for input(s): AMMONIA in the last 168 hours. CBC: Recent Labs  Lab 01/01/19 0448 01/02/19 0523  01/04/19 0406 01/05/19 0450 01/06/19 0327 01/06/19 1912 01/17/19 0414  WBC 18.1* 28.9*   < > 30.0* 46.3* 57.2* 27.0* 24.2*  NEUTROABS 14.5* 24.9*  --   --   --   --  26.2*  --   HGB 10.8* 10.3*   < > 11.1* 11.8* 12.0 11.1* 9.1*  HCT 35.1* 33.4*   < > 35.2* 38.6 40.4 39.1 33.5*  MCV 76.6* 76.6*   < > 76.2* 77.5* 81.9 85.0 89.8  PLT 198 161   < > 161 177 157 158 129*   < > = values in this interval not displayed.   Cardiac Enzymes: No results for input(s): CKTOTAL, CKMB, CKMBINDEX, TROPONINI in the last 168 hours. Sepsis Labs: Recent Labs  Lab 01/03/19 0433  01/04/19 0406 01/05/19 0450 01/06/19 0327 01/06/19 1911 01/06/19 1912 01/06/19 2355 2019/01/17 0202 01-17-2019 0414  PROCALCITON 0.37  --  0.31  --   --   --  1.43  --   --  2.23  WBC  --    < > 30.0* 46.3* 57.2*  --  27.0*  --   --  24.2*  LATICACIDVEN  --   --   --   --   --  >11.0*  --  >11.0* >11.0*  --    < > = values in this interval not displayed.    Procedures/Operations  Mechanical Intubation  Right femoral central line placement  Right basilic single lumen midline  Left femoral arterial line   Marda Stalker, [place] Pager [phone] (please enter 7 digits) PCCM Consult Pager [phone] (please enter 7 digits)

## 2019-01-29 NOTE — Progress Notes (Signed)
Pt extubated per MD order

## 2019-01-29 NOTE — Progress Notes (Signed)
[facility] [place] [facility] [place] NAME: Abigail Ayers    MR#:  [id number]  DATE OF BIRTH:  02-01-1946  SUBJECTIVE:   CODE BLUE called yesterday.  Patient is intubated and sedated.  She is currently on 3 pressors. Patient was planned to be discharged with hospice yesterday but family still wanted to keep her full code.  She decompensated and her respiratory status and was transferred to ICU on a ventilator.  REVIEW OF SYSTEMS:    Unable to obtain as patient is intubated and sedated   tolerating Diet:npo     DRUG ALLERGIES:   Allergies  Allergen Reactions  . Lisinopril Other (See Comments)    Other reaction(s): Cough    VITALS:  Blood pressure (!) 55/12, pulse (!) 112, temperature 98.2 F (36.8 C), resp. rate (!) 29, height 5' 0.5" (1.537 m), weight 76.7 kg, SpO2 95 %.  PHYSICAL EXAMINATION:  Constitutional: [place] ill-appearing intubated and sedated HENT: Normocephalic.  Intubated Eyes: Sluggish pupils 3 mm bilaterally Neck: No tracheal deviation. CVS: Tachycardic no murmurs or gallops   pulmonary: Decreased breath sounds throughout lung fields.  Abdominal: Slight distention with good bowel sounds   musculoskeletal: . No edema and no tenderness.  Neuro: Sedated   skin: Skin is warm and dry. No rash noted. Psychiatric: Sedated   LABORATORY PANEL:   CBC Recent Labs  Lab 02/02/2019 0414  WBC 24.2*  HGB 9.1*  HCT 33.5*  PLT 129*   ------------------------------------------------------------------------------------------------------------------  Chemistries  Recent Labs  Lab 01/06/19 1742  2019-02-02 0414 02-02-2019 0757  NA 155*   < > 157* 158*  K 4.3   < > 4.2 4.3  CL 114*   < > 112* 110  CO2 11*   < > 11* 11*  GLUCOSE 119*   < > 150* 180*  BUN 34*   < > 34* 33*  CREATININE 2.65*   < > 2.99* 3.25*  CALCIUM 8.1*   < > 7.0* 6.7*  MG 2.1  --   --   --   AST 1,427*  --  2,496*  --   ALT 405*  --  547*  --   ALKPHOS 232*   --  217*  --   BILITOT 3.8*  --  3.9*  --    < > = values in this interval not displayed.   ------------------------------------------------------------------------------------------------------------------  Cardiac Enzymes No results for input(s): TROPONINI in the last 168 hours. ------------------------------------------------------------------------------------------------------------------  RADIOLOGY:  Dg Abd 1 View  Result Date: 01/06/2019 CLINICAL DATA:  OG tube placement. EXAM: ABDOMEN - 1 VIEW COMPARISON:  None. FINDINGS: OG tube appears adequately positioned in the stomach. Overall bowel gas pattern is nonobstructive. Cholecystectomy clips in the RIGHT upper quadrant. IMPRESSION: OG tube appears adequately positioned in the stomach. Electronically Signed   By: Franki Cabot M.D.   On: 01/06/2019 17:23   US Abdomen Limited  Result Date: 01/06/2019 CLINICAL DATA:  Ascites. EXAM: LIMITED ABDOMEN ULTRASOUND FOR ASCITES TECHNIQUE: Limited ultrasound survey for ascites was performed in all four abdominal quadrants. COMPARISON:  Abdomen series 01/04/2019.  MRI 12/31/2018. FINDINGS: Minimal ascites noted. Paracentesis not performed. Repeat exam can be obtained as needed. IMPRESSION: Minimal ascites.  Paracentesis not performed. Electronically Signed   By: Marcello Moores  Register   On: 01/06/2019 10:09   US Venous Img Lower Bilateral  Result Date: 01/06/2019 CLINICAL DATA:  Lower extremity edema. EXAM: BILATERAL LOWER EXTREMITY VENOUS DOPPLER ULTRASOUND TECHNIQUE: Gray-scale sonography with graded compression, as well as  color Doppler and duplex ultrasound were performed to evaluate the lower extremity deep venous systems from the level of the common femoral vein and including the common femoral, femoral, profunda femoral, popliteal and calf veins including the posterior tibial, peroneal and gastrocnemius veins when visible. The superficial great saphenous vein was also interrogated. Spectral Doppler was  utilized to evaluate flow at rest and with distal augmentation maneuvers in the common femoral, femoral and popliteal veins. COMPARISON:  No recent. FINDINGS: RIGHT LOWER EXTREMITY Common Femoral Vein: No evidence of thrombus. Normal compressibility, respiratory phasicity and response to augmentation. Saphenofemoral Junction: No evidence of thrombus. Normal compressibility and flow on color Doppler imaging. Profunda Femoral Vein: No evidence of thrombus. Normal compressibility and flow on color Doppler imaging. Femoral Vein: No evidence of thrombus. Normal compressibility, respiratory phasicity and response to augmentation. Popliteal Vein: No evidence of thrombus. Normal compressibility, respiratory phasicity and response to augmentation. Calf Veins: No evidence of thrombus. Normal compressibility and flow on color Doppler imaging. Superficial Great Saphenous Vein: No evidence of thrombus. Normal compressibility. Other Findings:  Due to edema. LEFT LOWER EXTREMITY Common Femoral Vein: No evidence of thrombus. Normal compressibility, respiratory phasicity and response to augmentation. Saphenofemoral Junction: No evidence of thrombus. Normal compressibility and flow on color Doppler imaging. Profunda Femoral Vein: No evidence of thrombus. Normal compressibility and flow on color Doppler imaging. Femoral Vein: No evidence of thrombus. Normal compressibility, respiratory phasicity and response to augmentation. Popliteal Vein: No evidence of thrombus. Normal compressibility, respiratory phasicity and response to augmentation. Calf Veins: No evidence of thrombus. Normal compressibility and flow on color Doppler imaging. Superficial Great Saphenous Vein: No evidence of thrombus. Normal compressibility. Other Findings:  Exam limited due to edema. IMPRESSION: No evidence of deep venous thrombosis in either lower extremity. Electronically Signed   By: Marcello Moores  Register   On: 01/06/2019 10:46   Dg Chest Port 1 View  Result  Date: 2019-01-18 CLINICAL DATA:  Acute respiratory failure. EXAM: PORTABLE CHEST 1 VIEW COMPARISON:  01/06/2019. FINDINGS: Endotracheal tube has been repositioned, its tip is now 2.6 cm above the carina. NG tube tip below left hemidiaphragm. Small IV catheter again noted over the left neck. Heart size normal. Severe bilateral pulmonary infiltrates/edema again noted. No pleural effusion or pneumothorax. IMPRESSION: 1. Endotracheal tube has been repositioned, its tip is now 2.6 cm above the carina. NG tube below the left hemidiaphragm. Small IV catheter again noted over the left neck. 2. Bilateral pulmonary infiltrates/edema again noted. No interim change. Electronically Signed   By: Marcello Moores  Register   On: 01-18-19 07:44   Dg Chest Port 1 View  Result Date: 01/06/2019 CLINICAL DATA:  Encounter for central line placement EXAM: PORTABLE CHEST 1 VIEW COMPARISON:  01/06/2019 FINDINGS: Endotracheal tube is in place with tip 12 millimeters above the carina. The tip of the endotracheal tube is directed towards the RIGHT mainstem bronchus. Consider withdrawing endotracheal tube by 2 centimeters. Nasogastric tube is in place, tip in off the image beyond the gastroesophageal junction. A small bore catheter has been placed in the LEFT side of the neck, likely an EJ catheter. Despite the history, no central line is identified. Heart is enlarged. There are bilateral pleural effusions. Bibasilar opacities have increased since prior study. IMPRESSION: 1. Endotracheal tube directed towards the RIGHT mainstem bronchus. Consider withdrawing endotracheal tube by 2 centimeters. 2. Increased bibasilar opacities and pleural effusions. 3. Interval placement of LEFT EJ line. Central line is not imaged. Recommend correlation with history. 4. These results will be  called to the ordering clinician or representative by the Radiologist Assistant, and communication documented in the PACS or zVision Dashboard. Electronically Signed   By:  Nolon Nations M.D.   On: 01/06/2019 19:42   Dg Chest Port 1 View  Result Date: 01/06/2019 CLINICAL DATA:  Endotracheal tube placement EXAM: PORTABLE CHEST 1 VIEW COMPARISON:  Chest x-ray from earlier same day FINDINGS: Endotracheal tube projects slightly into the RIGHT mainstem bronchus. Heart size is within normal limits. Pacer pad overlies the LEFT heart/mediastinal border. New bibasilar opacities, presumably asymmetric edema and/or atelectasis. No pneumothorax seen. IMPRESSION: 1. Endotracheal tube projects slightly into the RIGHT mainstem bronchus. Recommend retracting approximately 2 cm. 2. New bibasilar opacities, presumably asymmetric edema and/or atelectasis. These results will be called to the ordering clinician or representative by the Radiologist Assistant, and communication documented in the PACS or zVision Dashboard. Electronically Signed   By: Franki Cabot M.D.   On: 01/06/2019 17:23     ASSESSMENT AND PLAN:   73 year old female initially admitted to the hospital for altered mental status with history of advanced HCC and portal vein thrombosis with plans to be discharged with home hospice on July 9 however decompensated with acute cardiac/respiratory arrest and was still full code and transferred to ICU on ventilator.   1.  Severe acute hypoxic and hypercapnic respiratory failure status post cardiac arrest with the possibility of cardiogenic/hypovolemic shock with severe metabolic acidosis: Continue full mechanical ventilator support as per ICU MD Continue empiric antibiotics IV fluid resuscitation as tolerated  Follow lactic acid WBC improved 2.  Acute cardiac arrest/cardiogenic shock: Patient on 3 pressors   3.  Acute kidney injury with multiorgan failure due to shock/hypoperfusion/  4.  Hypernatremia: Patient needs to be on D5  5.  Abdominal distention with advanced HCC: Abdominal ultrasound did not show much ascites.  X-ray shows no obstruction.  Questionable ischemic  colitis. Empirically on Zosyn.  6.  Electrolyte abnormalities: Pharmacy consultation for replacement  7.  Advanced HCC: Patient followed by Dr. Tasia Catchings.  Plan was to be discharged home with hospice on July 9 however as mentioned she decompensated and is now intubated. Patient with very poor overall prognosis.  Patient with multiorgan failure Recommend DNR however patient's son who is power of attorney is not ready at this time Appreciate palliative care consultation  Patient at high risk for cardiopulmonary arrest   CODE STATUS: full  TOTAL TIME TAKING CARE OF THIS PATIENT: 40 minutes.     POSSIBLE D/C ??, DEPENDING ON CLINICAL CONDITION.   Bettey Costa M.D on 01-11-2019 at 10:05 AM  Between 7am to 6pm - Pager - [phone] After 6pm go to [email or web address] - password [email or web address] [place] [facility]  Office  [phone]  CC: Primary care physician; Sharyne Peach, MD  Note: This dictation was prepared with Dragon dictation along with smaller phrase technology. Any transcriptional errors that result from this process are unintentional.

## 2019-01-29 NOTE — Progress Notes (Signed)
Family meeting held with pts daughter Abigail Ayers and pts son Abigail Ayers along with 2 other family members.  I informed them despite aggressive treatment [name] remains in multiorgan failure and lab values have worsened and pt showing signs she is actively dying.  She is also unresponsive with a RASS score of -5.  After further discussion pts family decided to change pts code status to DO NOT RESUSCITATE and to transition pt to comfort care measures only.  Therefore, pt extubated and family currently at bedside.    Abigail Ayers, [place] Pager [phone] (please enter 7 digits) PCCM Consult Pager [phone] (please enter 7 digits)

## 2019-01-29 NOTE — Progress Notes (Signed)
Called and spoke with pt's daughter [name] regarding her ability to come to the hospital earlier than 10:30am. Per [name], she spoke with her brother who advised he was currently at work and would not be able to come to the hospital prior to 10:30am. [name] states that she does not want to come to the hospital without her brother and stated "I need to wait for my brother to be there." Awaiting arrival of pt's daughter and son for family conference with Dr. Alva Garnet.

## 2019-01-29 NOTE — Telephone Encounter (Signed)
Patient is deceased.

## 2019-01-29 NOTE — Progress Notes (Signed)
Called pt's daughter, [name], and advised that Dr. Alva Garnet is requesting a family meeting today at 10:30am to discuss pt's health status. Advised [name] that if she is able to come prior to 10:30am to see her mother that would be advised. [name] verbalizes understanding. Per Donora, she will contact her brother to see if they are able to come at 10:30am.

## 2019-01-29 DEATH — deceased

## 2020-09-27 IMAGING — CT CT ABDOMEN AND PELVIS WITH CONTRAST
3 of 10 series · 11 of 32 positions shown, 16 images · IV contrast (iopamidol)
Comparison: Right upper quadrant abdominal ultrasound-11/04/2018;
CT abdomen and pelvis-03/11/2010

CLINICAL DATA: Abdominal pain and constipation. Elevated bilirubin
level. Questionable portal vein thrombus demonstrated on right upper
quadrant abdominal ultrasound performed 11/04/2018.

EXAM:
CT ABDOMEN AND PELVIS WITH CONTRAST
TECHNIQUE: Multidetector CT imaging of the abdomen and pelvis was performed
using the standard protocol following bolus administration of
intravenous contrast.
CONTRAST:  100mL AII24Y-NLL IOPAMIDOL (AII24Y-NLL) INJECTION 61%

[Series 4: axial arterial · axial · arterial · 0.61mm/px · z∈[-222,-6]mm · 3 of 144 slices shown (1 of 2)]
[im 36/144  soft-tissue]
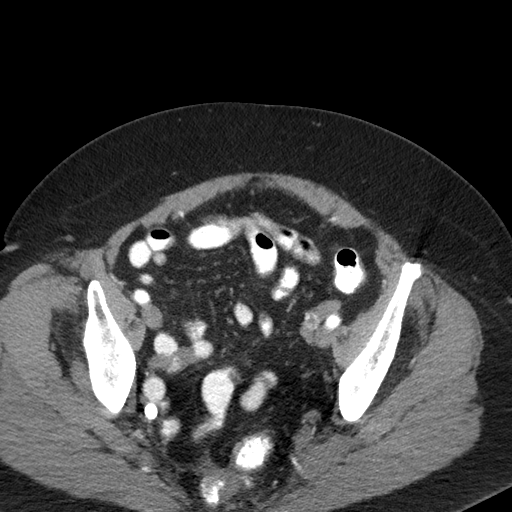
[im 72/144  soft-tissue]
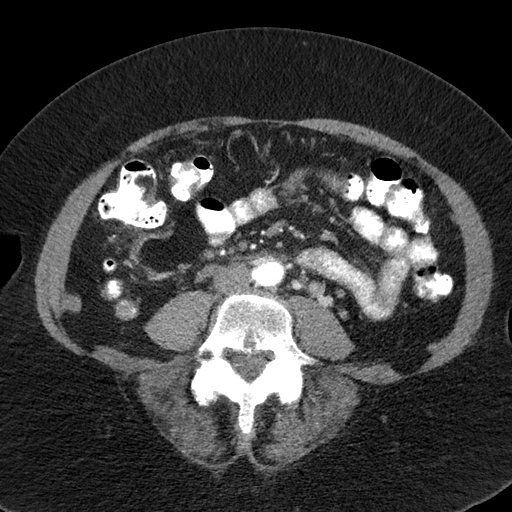
[im 108/144  soft-tissue]
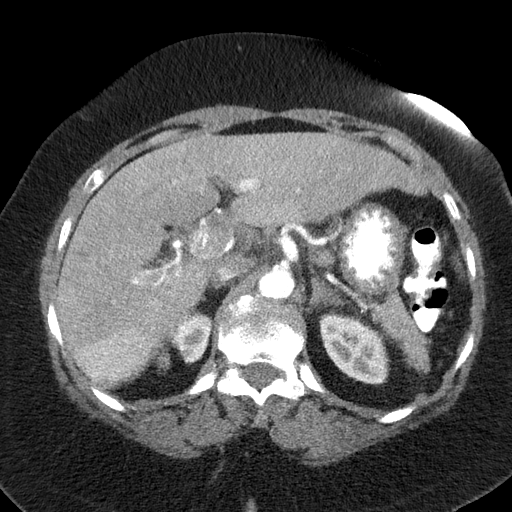

[Series 15: axial arterial · axial · arterial · 0.65mm/px · z∈[-244,+14]mm · 4 of 144 slices shown (2 of 2)]
[im 29/144  soft-tissue]
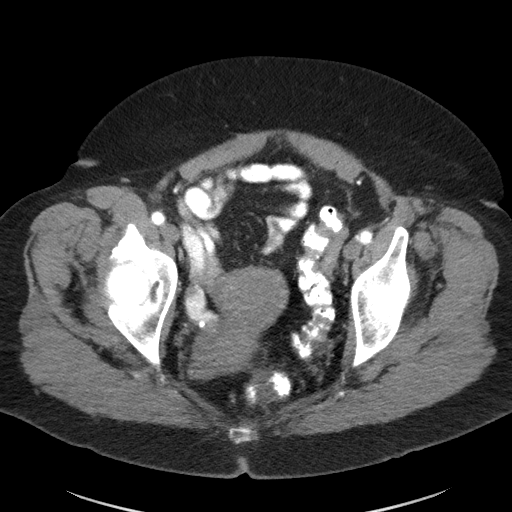
[im 58/144  soft-tissue]
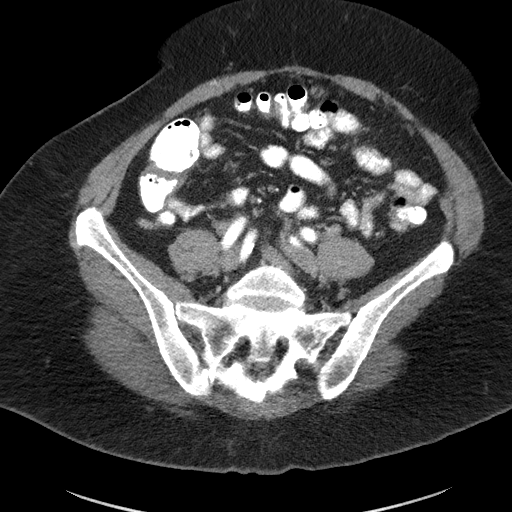
[im 86/144  soft-tissue]
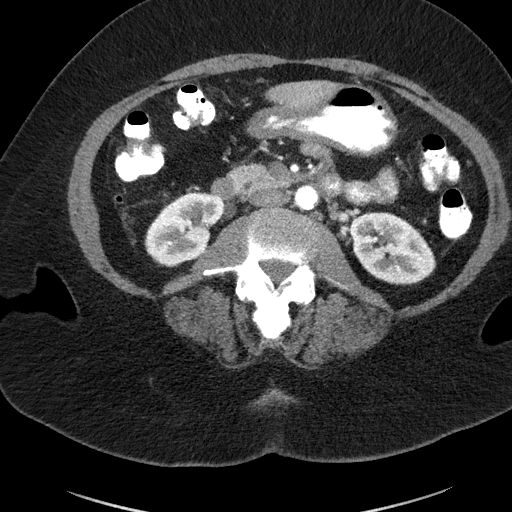
[im 115/144  soft-tissue]
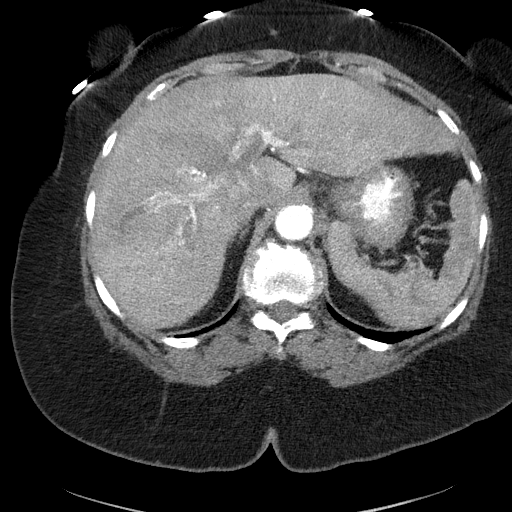

[Series 16: axial venous · axial · portal-venous · 0.68mm/px · z∈[-244,+14]mm · 4 of 144 slices shown, 9 images]
[im 29/144  soft-tissue]
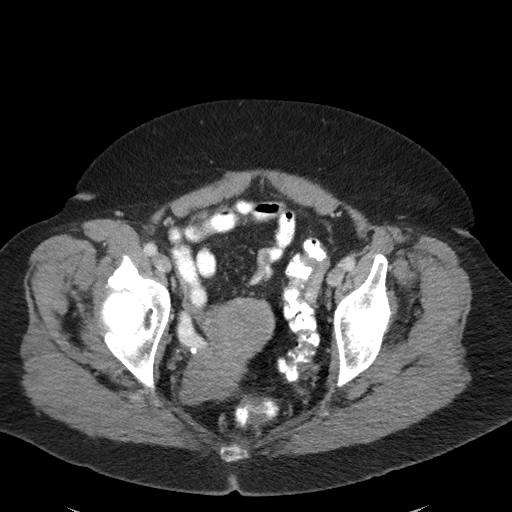
[im 29/144  lung]
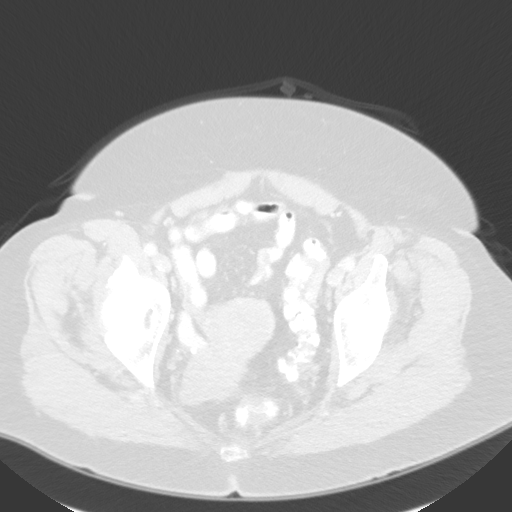
[im 29/144  bone]
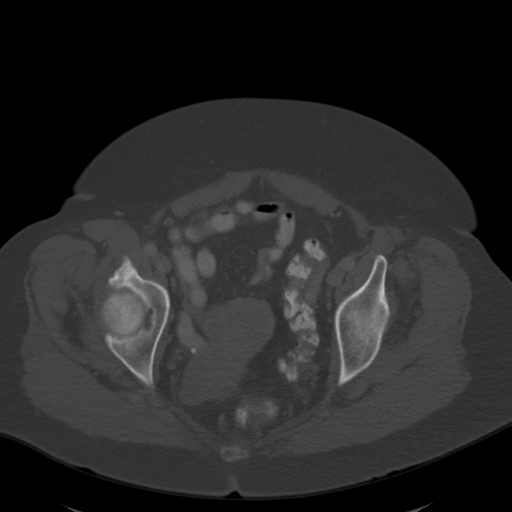
[im 58/144  soft-tissue]
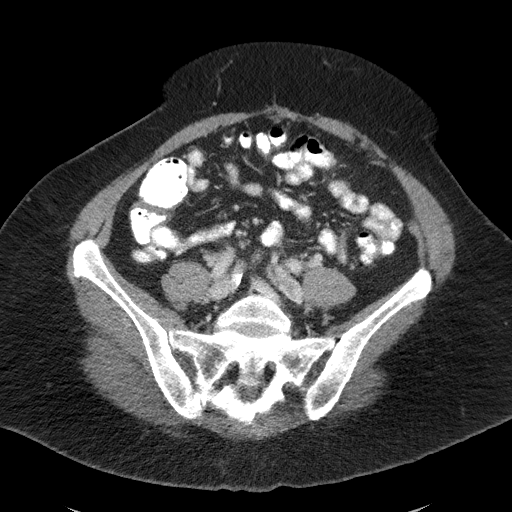
[im 58/144  lung]
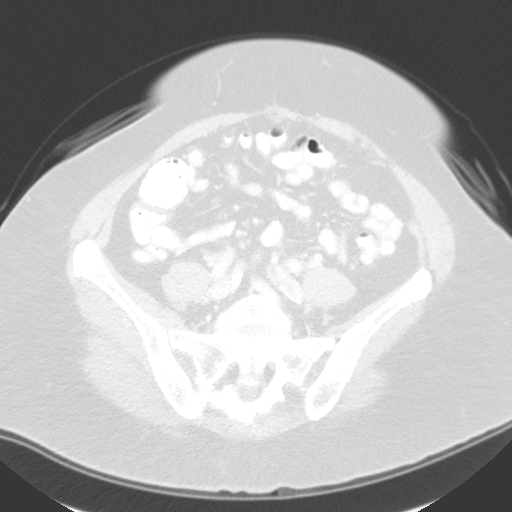
[im 86/144  soft-tissue]
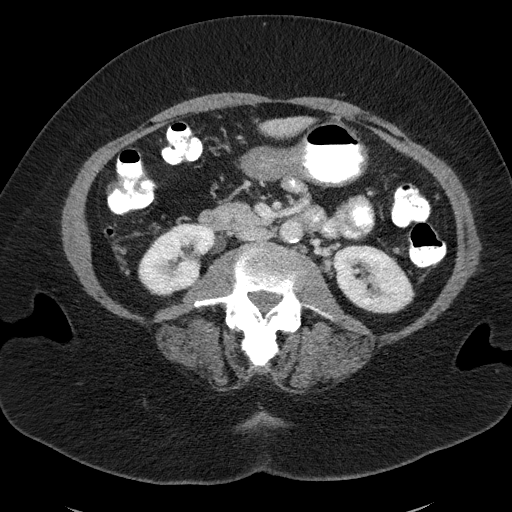
[im 86/144  lung]
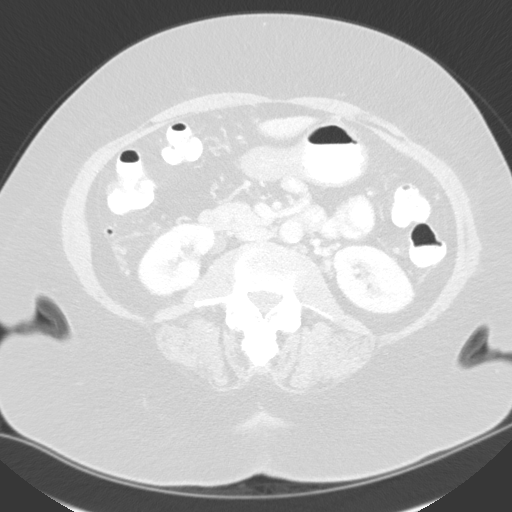
[im 115/144  soft-tissue]
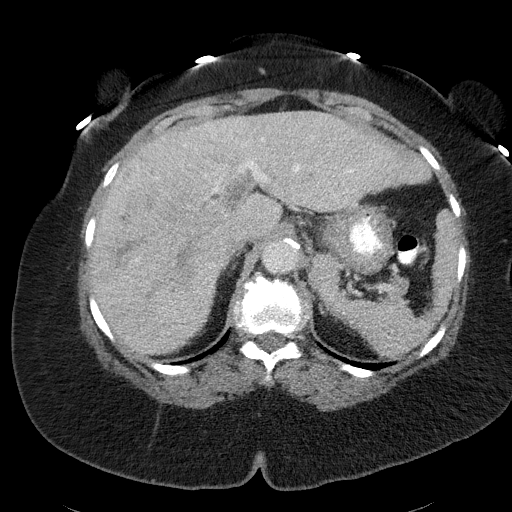
[im 115/144  lung]
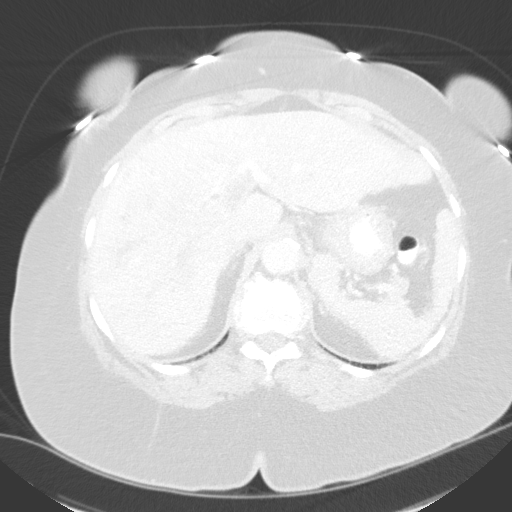

[11 of 32 positions shown; findings below may reference images not displayed]

FINDINGS: Lower chest: Limited visualization the lower thorax demonstrates
minimal dependent subpleural ground-glass atelectasis. No focal
airspace opacities. No pleural effusion.

Normal heart size. Coronary artery calcifications. No pericardial
effusion.

Hepatobiliary: Multiphase contrast-enhanced CT scan confirms
suspicion of extensive predominantly occlusive portal venous
thrombus extending from the central aspect of the main portal vein
into both the right and left portal veins as well as many of its
segmental branches bilaterally (best seen on coronal images 35
through 50, series 13). This finding is associated with cavernous
transformation suggesting an element of chronicity.

While discrete hyperenhancing hepatic lesions are not identified,
perfusion abnormalities are noted within the subcapsular aspect of
the posterior segment of the right lobe of the liver as sequela of
portal venous thrombosis. There is an ill-defined area of
hypoattenuation involving the central aspect the right lobe liver
(best seen on image 14, series 6), though a definitive lesion is not
identified, though note evaluation is degraded secondary to altered
perfusion the hepatic parenchyma in the setting portal venous
thrombosis and cavernous transformation.

Post cholecystectomy. No intra or extrahepatic biliary duct
dilatation. Trace amount of fluid is seen within the right pericolic
gutter though there is no significant intra-abdominal ascites.

Pancreas: Normal appearance of the pancreas. No definitive
pancreatic atrophy are ductal dilatation. No definitive
peripancreatic stranding.

Spleen: Normal appearance of the spleen without evidence of
splenomegaly.

Adrenals/Urinary Tract: There is symmetric enhancement and excretion
of the bilateral kidneys. No renal stones. No discrete renal
lesions. No urine obstruction or perinephric stranding.

Normal appearance the bilateral adrenal glands. Normal appearance of
the urinary bladder given underdistention.

Stomach/Bowel: Ingested enteric contrast extends to the level of the
rectum. No evidence of enteric obstruction. Normal appearance of the
terminal ileum and the retrocecal appendix. No pneumoperitoneum,
pneumatosis or portal venous gas.

Vascular/Lymphatic: Atherosclerotic plaque within a normal caliber
abdominal aorta. The major branch vessels of the abdominal aorta
appear patent on this non CTA examination.

Extensive portal venous thrombosis with associated cavernous
transformation as detailed in the hepatobiliary section of the above
report.

The IVC and pelvic venous system appear patent.

Reproductive: Normal appearance of the pelvic organs. No discrete
adnexal lesion. No free fluid within the pelvic cul-de-sac.

Other: Regional soft tissues appear normal.

Musculoskeletal: No acute or aggressive osseous abnormalities.
Mild-to-moderate multilevel lumbar spine DDD, worse at T11-T12 with
disc space height loss, endplate irregularity and sclerosis.
Bilateral facet degenerative change of the lower lumbar spine.
IMPRESSION: 1. Examination is positive for extensive portal venous thrombosis
with associated cavernous transformation. The exact etiology of the
portal venous thrombosis is not depicted on this examination,
specifically, no definitive stigmata of cirrhosis/portal venous
hypertension or discrete hepatic lesion, though evaluation is
degraded secondary to altered perfusion of the hepatic parenchyma in
the setting of portal venous thrombosis. Further evaluation with the
acquisition of an AFP level as well as MRCP could be performed as
clinically indicated.
2. Post cholecystectomy. No intra or extrahepatic biliary ductal
dilatation. No definitive pancreatic abnormality.
3. Coronary calcifications.  Aortic Atherosclerosis ([id number]).

These results will be called to the ordering clinician or
representative by the Radiologist Assistant, and communication
documented in the PACS or zVision Dashboard.
